# Patient Record
Sex: Male | Born: 1945 | Race: White | Hispanic: No | Marital: Married | Smoking: Never smoker
Health system: Southern US, Community
[De-identification: ages and names within clinical notes are randomized; demographics above are authoritative.]

## PROBLEM LIST (undated history)

## (undated) ENCOUNTER — Ambulatory Visit: Admission: EM | Payer: Medicare HMO | Source: Home / Self Care

## (undated) DIAGNOSIS — C801 Malignant (primary) neoplasm, unspecified: Secondary | ICD-10-CM

## (undated) DIAGNOSIS — R55 Syncope and collapse: Secondary | ICD-10-CM

## (undated) DIAGNOSIS — M316 Other giant cell arteritis: Secondary | ICD-10-CM

## (undated) DIAGNOSIS — I499 Cardiac arrhythmia, unspecified: Secondary | ICD-10-CM

## (undated) HISTORY — PX: APPENDECTOMY: SHX54

## (undated) HISTORY — PX: KNEE SURGERY: SHX244

## (undated) HISTORY — PX: REPLACEMENT TOTAL KNEE: SUR1224

## (undated) HISTORY — PX: NOSE SURGERY: SHX723

## (undated) HISTORY — PX: PACEMAKER INSERTION: SHX728

---

## 2009-05-29 ENCOUNTER — Inpatient Hospital Stay (HOSPITAL_COMMUNITY): Admission: RE | Admit: 2009-05-29 | Discharge: 2009-06-01 | Payer: Self-pay | Admitting: Orthopedic Surgery

## 2009-06-13 ENCOUNTER — Encounter: Payer: Self-pay | Admitting: Orthopedic Surgery

## 2009-06-21 ENCOUNTER — Encounter: Payer: Self-pay | Admitting: Orthopedic Surgery

## 2009-07-22 ENCOUNTER — Encounter: Payer: Self-pay | Admitting: Orthopedic Surgery

## 2009-08-21 ENCOUNTER — Encounter: Payer: Self-pay | Admitting: Orthopedic Surgery

## 2009-09-21 ENCOUNTER — Encounter: Payer: Self-pay | Admitting: Orthopedic Surgery

## 2010-10-09 LAB — HM COLONOSCOPY

## 2010-12-26 LAB — CBC
HCT: 31.7 % — ABNORMAL LOW (ref 39.0–52.0)
HCT: 37.2 % — ABNORMAL LOW (ref 39.0–52.0)
Hemoglobin: 10.8 g/dL — ABNORMAL LOW (ref 13.0–17.0)
Hemoglobin: 11.1 g/dL — ABNORMAL LOW (ref 13.0–17.0)
Hemoglobin: 12.5 g/dL — ABNORMAL LOW (ref 13.0–17.0)
MCHC: 33.9 g/dL (ref 30.0–36.0)
MCHC: 33.9 g/dL (ref 30.0–36.0)
MCV: 85.6 fL (ref 78.0–100.0)
MCV: 85.7 fL (ref 78.0–100.0)
MCV: 85.9 fL (ref 78.0–100.0)
RBC: 3.8 MIL/uL — ABNORMAL LOW (ref 4.22–5.81)
RDW: 13.6 % (ref 11.5–15.5)
RDW: 13.7 % (ref 11.5–15.5)
WBC: 13.2 10*3/uL — ABNORMAL HIGH (ref 4.0–10.5)

## 2010-12-26 LAB — BASIC METABOLIC PANEL
CO2: 30 mEq/L (ref 19–32)
Chloride: 102 mEq/L (ref 96–112)
Chloride: 97 mEq/L (ref 96–112)
GFR calc Af Amer: >60 mL/min (ref >60)
GFR calc non Af Amer: >60 mL/min (ref >60)
Glucose, Bld: 102 mg/dL — ABNORMAL HIGH (ref 70–99)
Glucose, Bld: 149 mg/dL — ABNORMAL HIGH (ref 70–99)
Potassium: 5 mEq/L (ref 3.5–5.1)
Sodium: 133 mEq/L — ABNORMAL LOW (ref 135–145)
Sodium: 137 mEq/L (ref 135–145)

## 2010-12-26 LAB — TYPE AND SCREEN

## 2010-12-27 LAB — CBC
HCT: 46.9 % (ref 39.0–52.0)
Hemoglobin: 15.8 g/dL (ref 13.0–17.0)
MCHC: 33.7 g/dL (ref 30.0–36.0)
RDW: 13.9 % (ref 11.5–15.5)

## 2010-12-27 LAB — URINALYSIS, ROUTINE W REFLEX MICROSCOPIC
Bilirubin Urine: NEGATIVE
Glucose, UA: NEGATIVE mg/dL
Hgb urine dipstick: NEGATIVE
Ketones, ur: NEGATIVE mg/dL
pH: 6 (ref 5.0–8.0)

## 2010-12-27 LAB — COMPREHENSIVE METABOLIC PANEL
Alkaline Phosphatase: 72 U/L (ref 39–117)
BUN: 14 mg/dL (ref 6–23)
Glucose, Bld: 92 mg/dL (ref 70–99)
Potassium: 4.4 mEq/L (ref 3.5–5.1)
Total Bilirubin: 0.9 mg/dL (ref 0.3–1.2)
Total Protein: 7.5 g/dL (ref 6.0–8.3)

## 2010-12-27 LAB — PROTIME-INR
INR: 1 (ref 0.00–1.49)
Prothrombin Time: 12.7 seconds (ref 11.6–15.2)

## 2011-02-06 NOTE — H&P (Signed)
NAMEESVIN, HNAT NO.:  [id number]   MEDICAL RECORD NO.:  [id number]          PATIENT TYPE:  INP   LOCATION:  0001                         FACILITY:  [facility]   PHYSICIAN:  Ollen Gross, M.D.    DATE OF BIRTH:  [birth year]   DATE OF ADMISSION:  05/29/2009  DATE OF DISCHARGE:                              HISTORY & PHYSICAL   CHIEF COMPLAINT:  Left knee pain.   HISTORY OF PRESENT ILLNESS:  Patient is a 65 year old male who has been  seen by Dr. Lequita Halt for ongoing left knee pain.  He has undergone  injuries in the past playing soccer and underwent ligament  reconstruction.  He has had ongoing problems with his left knee for  quite some time now which brought him in to see Dr. Lequita Halt.  He has  been found to have bone-on-bone arthritis, medial patellofemoral  compartments, with large osteophytes.  It is felt he has already reached  a point where he could benefit from undergoing surgical intervention.  Risks and benefits been discussed and he elected to proceed with  surgery.   ALLERGIES:  NONE.   CURRENT MEDICATIONS:  None.   PAST MEDICAL HISTORY:  Negative.   PAST SURGICAL HISTORY:  1. Left knee reconstructive surgery in 1975.  2. Nose surgery in 1983.  3. Emergency appendectomy in 1992.  4. Nasal surgery again in 1995.   FAMILY HISTORY:  Father, history of pacemaker.  Mother living, age 3.  He has a brother, 90, a sister, 69.   SOCIAL HISTORY:  Married.  Currently unemployed.  Two to 3 beers a week.  He does have a caregiver lined up after surgery.   REVIEW OF SYSTEMS:  GENERAL:  No fevers, chills, night sweats.  NEURO:  No seizure, syncope, or paralysis.  RESPIRATORY:  No shortness of  breath, productive cough, or hemoptysis.  CARDIOVASCULAR:  No chest  pain, angina, or orthopnea.  GI:  No nausea, vomiting, diarrhea, or  constipation.  GU:  No dysuria, hematuria, or discharge.  MUSCULOSKELETAL:  Left knee.   PHYSICAL EXAM:  VITAL SIGNS:  Pulse  64.  Respirations 12.  Blood  pressure 158/90.  GENERAL:  A 65 year old white male, well nourished, well developed, no  acute distress.  She is alert, oriented, and cooperative.  HEENT:  Normocephalic, atraumatic.  Pupils round and reactive.  EOMs  intact.  NECK:  Supple.  CHEST:  Clear.  HEART:  Regular rate and rhythm without murmur.  S1 and S2 noted.  ABDOMEN:  Soft, nontender.  Bowel sounds present.  RECTAL:  Not done, no pertinent to present illness.  BREASTS:  Not done, no pertinent to present illness.  GENITALIA:  Not done, no pertinent to present illness.  EXTREMITIES:  Left knee, no effusion, well-healed medial arthrotomy scar  noted.  Range of motion 5 to 125.  Slight varus malalignment deformity.   IMPRESSION:  Osteoarthritis, left knee.   PLAN:  Patient admitted to [facility] to undergo a left total  knee replacement arthroplasty.  Surgery will be performed by Dr. Ollen Gross.      Alexzandrew L.  Perkins, P.A.C.      Ollen Gross, M.D.  Electronically Signed    ALP/MEDQ  D:  05/28/2009  T:  05/29/2009  Job:  [id number]   cc:   Julieanne Manson  Fax: [phone]   Ollen Gross, M.D.  Fax: [phone]

## 2011-04-23 ENCOUNTER — Encounter: Payer: Self-pay | Admitting: Sports Medicine

## 2011-05-23 ENCOUNTER — Encounter: Payer: Self-pay | Admitting: Sports Medicine

## 2011-07-16 ENCOUNTER — Ambulatory Visit: Payer: Self-pay | Admitting: Sports Medicine

## 2012-03-24 ENCOUNTER — Emergency Department: Payer: Self-pay | Admitting: Emergency Medicine

## 2012-03-24 LAB — COMPREHENSIVE METABOLIC PANEL
Albumin: 4.1 g/dL (ref 3.4–5.0)
Alkaline Phosphatase: 104 U/L (ref 50–136)
Calcium, Total: 9 mg/dL (ref 8.5–10.1)
Glucose: 98 mg/dL (ref 65–99)
SGOT(AST): 43 U/L — ABNORMAL HIGH (ref 15–37)
SGPT (ALT): 45 U/L
Sodium: 141 mmol/L (ref 136–145)

## 2012-03-24 LAB — CBC
HCT: 48.8 % (ref 40.0–52.0)
HGB: 16.3 g/dL (ref 13.0–18.0)
MCHC: 33.3 g/dL (ref 32.0–36.0)

## 2012-03-24 LAB — URINALYSIS, COMPLETE
Bacteria: NONE SEEN
Bilirubin,UR: NEGATIVE
Ketone: NEGATIVE
Ph: 7 (ref 4.5–8.0)
RBC,UR: 750 /HPF (ref 0–5)
WBC UR: 2 /HPF (ref 0–5)

## 2012-04-01 ENCOUNTER — Ambulatory Visit: Payer: Self-pay | Admitting: Urology

## 2012-04-05 ENCOUNTER — Ambulatory Visit: Payer: Self-pay | Admitting: Urology

## 2012-04-14 ENCOUNTER — Ambulatory Visit: Payer: Self-pay | Admitting: Urology

## 2012-05-05 ENCOUNTER — Ambulatory Visit: Payer: Self-pay | Admitting: Urology

## 2012-09-25 IMAGING — CR DG ABDOMEN 1V
1 series · 2 of 2 positions shown · non-contrast
Comparison: none

REASON FOR EXAM: nephrolithiasis and renal colic
COMMENTS:

[Series 5: t abdomen supine · 0.14mm/px · 2 of 2 slices shown]
[im 1/2]
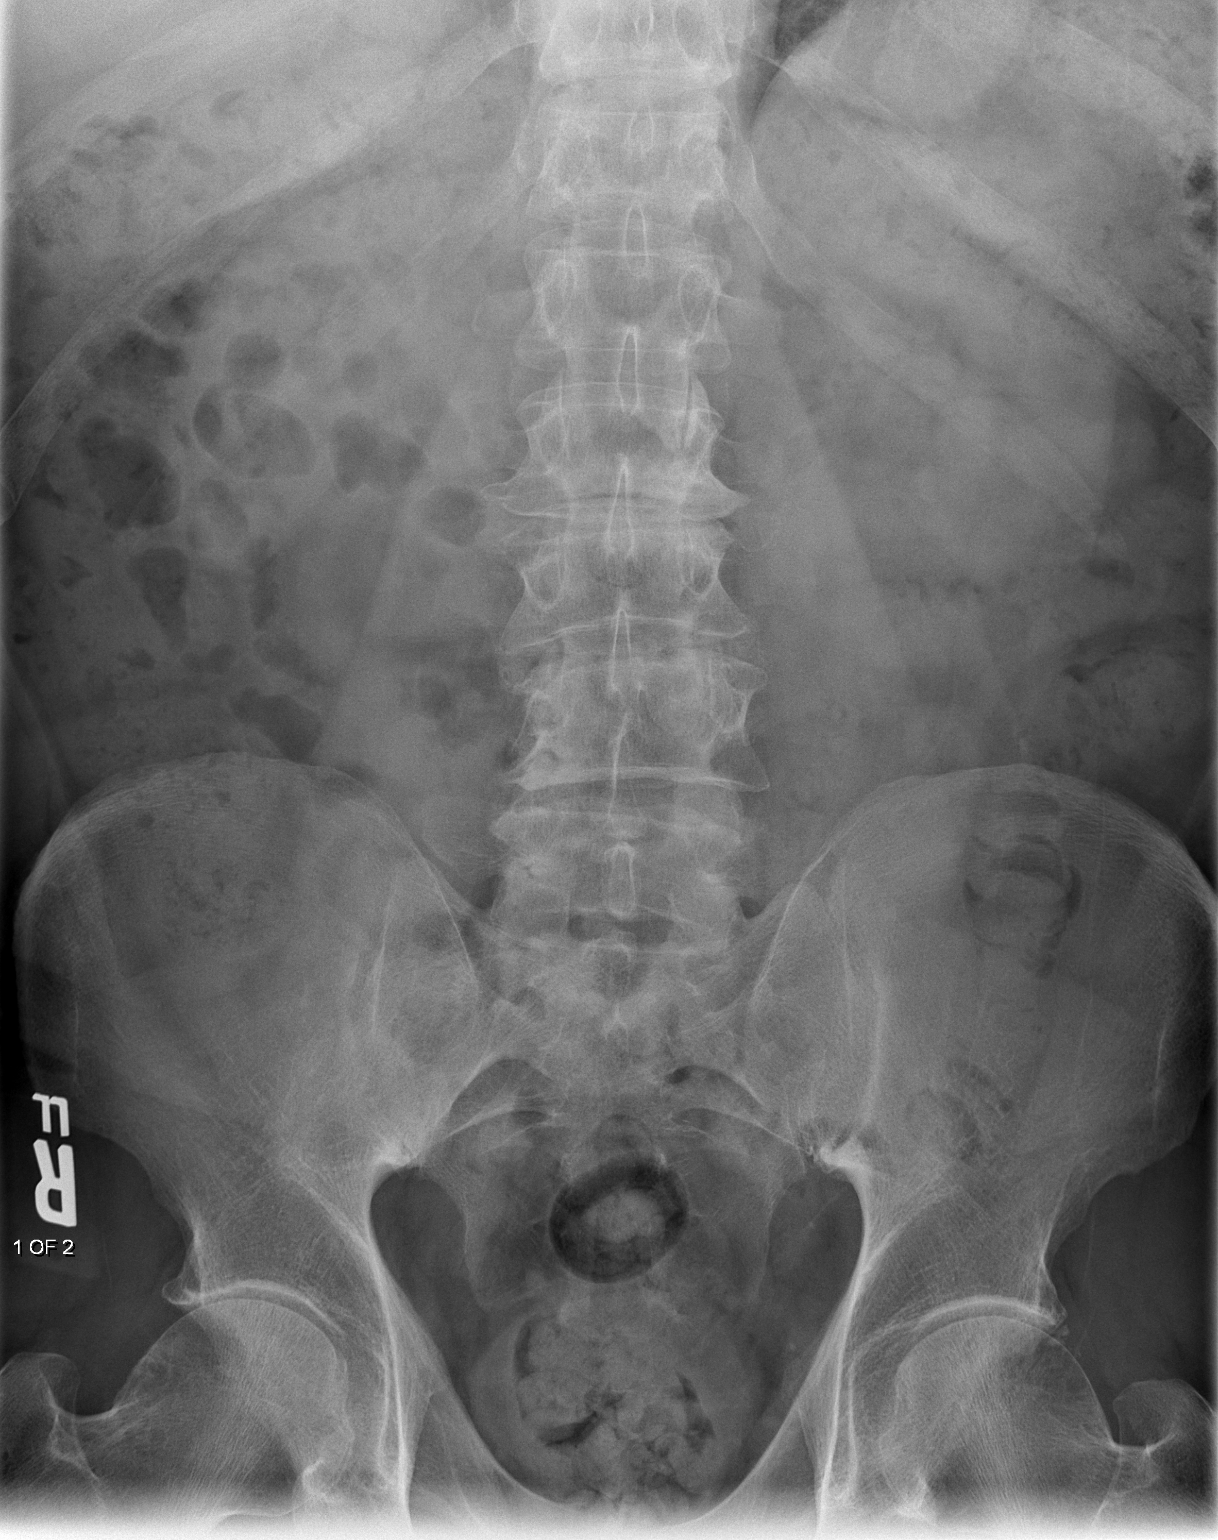
[im 2/2]
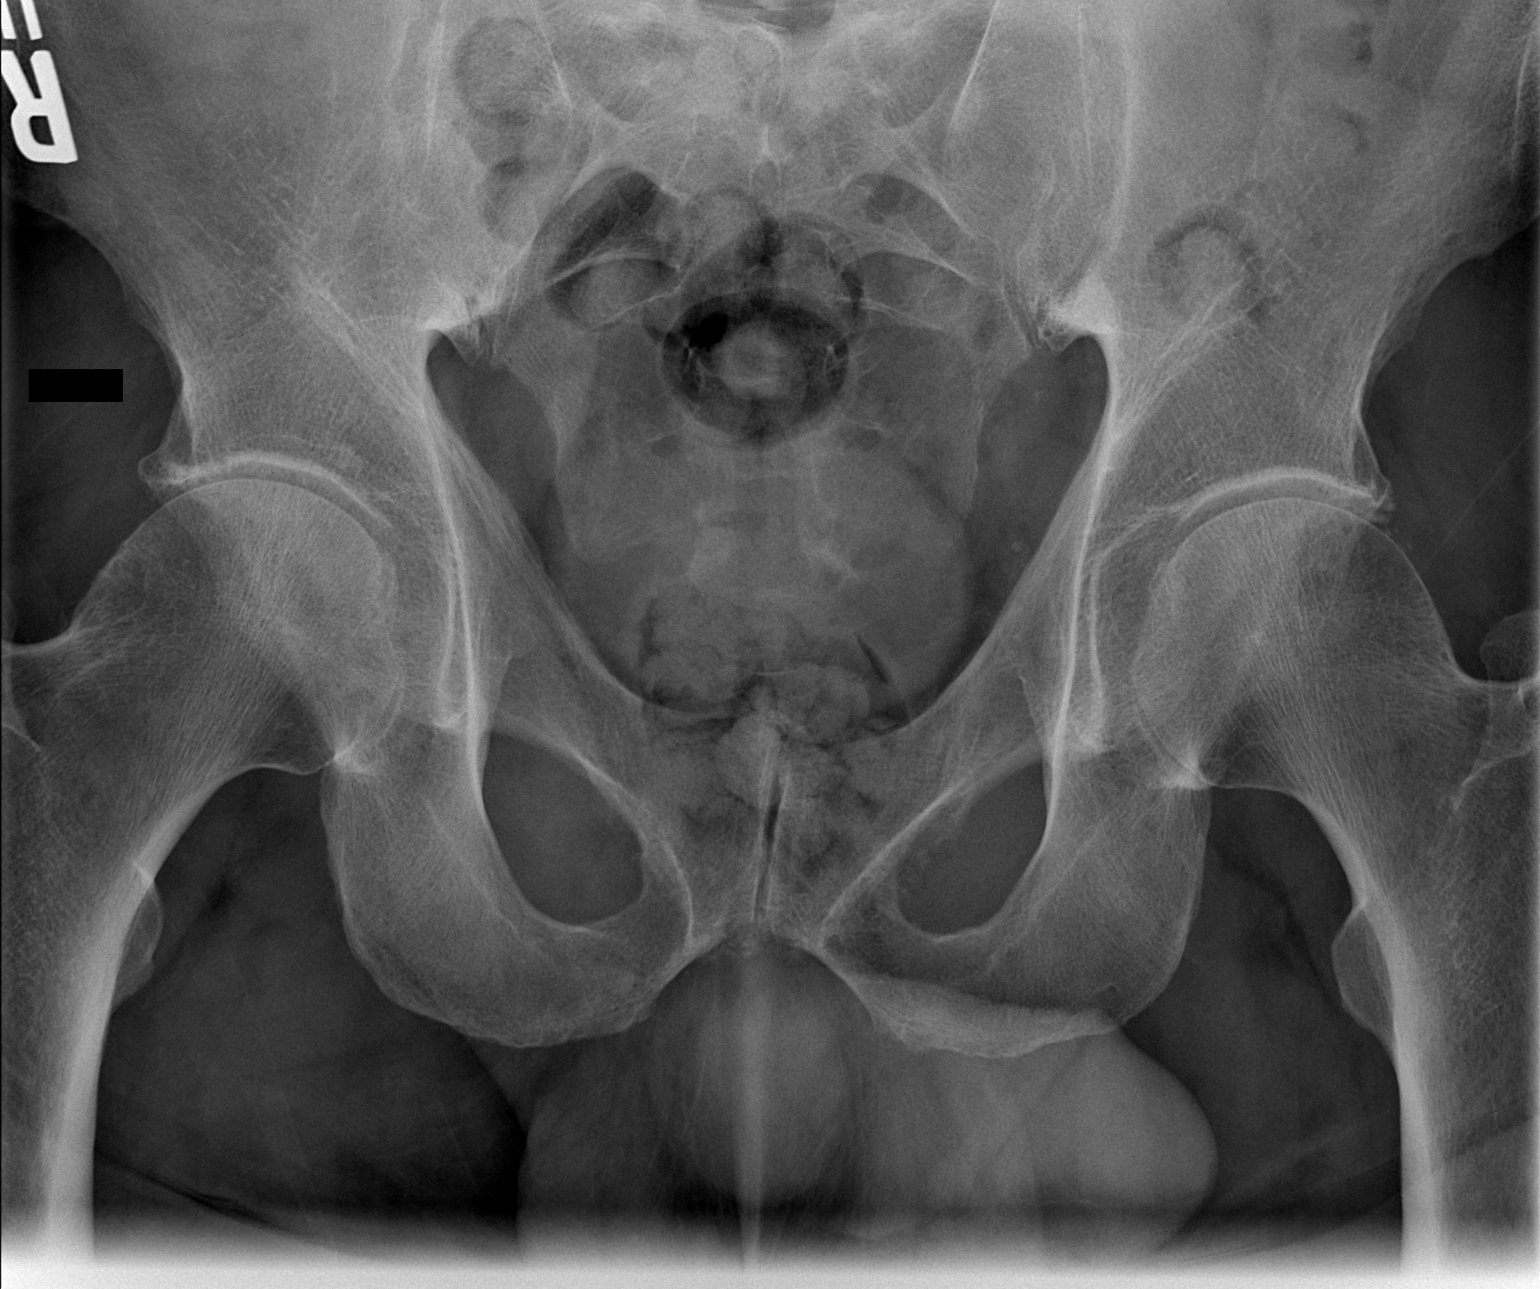

[2 of 2 positions shown; findings below may reference images not displayed]

PROCEDURE:     DXR - DXR KIDNEY URETER BLADDER  - May 05, 2012  [DATE]

RESULT:     Comparison is made to the study 14 April, 2012. The patient has
history of stones in the right urinary tract. On today's study the bowel gas
pattern is within the limits of normal. No abnormal calcifications are
demonstrated over either kidney. Along the expected course of the ureters no
calcifications are seen. No stones are identified in the pelvis in the
region of the urinary bladder.
IMPRESSION: I do not see evidence of calcified urinary tract stones.

[REDACTED]

## 2013-09-27 ENCOUNTER — Ambulatory Visit: Payer: Self-pay | Admitting: Unknown Physician Specialty

## 2014-01-03 ENCOUNTER — Emergency Department: Payer: Self-pay | Admitting: Emergency Medicine

## 2014-01-03 LAB — CBC
HCT: 46.6 % (ref 40.0–52.0)
HGB: 14.9 g/dL (ref 13.0–18.0)
MCH: 27.2 pg (ref 26.0–34.0)
MCHC: 32 g/dL (ref 32.0–36.0)
MCV: 85 fL (ref 80–100)
Platelet: 178 10*3/uL (ref 150–440)
RBC: 5.49 10*6/uL (ref 4.40–5.90)
RDW: 14.3 % (ref 11.5–14.5)
WBC: 8.3 10*3/uL (ref 3.8–10.6)

## 2014-01-03 LAB — COMPREHENSIVE METABOLIC PANEL
ALBUMIN: 3.9 g/dL (ref 3.4–5.0)
ALK PHOS: 110 U/L
ANION GAP: 3 — AB (ref 7–16)
AST: 36 U/L (ref 15–37)
BILIRUBIN TOTAL: 0.4 mg/dL (ref 0.2–1.0)
BUN: 15 mg/dL (ref 7–18)
CHLORIDE: 104 mmol/L (ref 98–107)
CO2: 30 mmol/L (ref 21–32)
CREATININE: 0.9 mg/dL (ref 0.60–1.30)
Calcium, Total: 8.7 mg/dL (ref 8.5–10.1)
EGFR (African American): >60
EGFR (Non-African Amer.): >60
Glucose: 95 mg/dL (ref 65–99)
Osmolality: 274 (ref 275–301)
Potassium: 3.9 mmol/L (ref 3.5–5.1)
SGPT (ALT): 40 U/L (ref 12–78)
Sodium: 137 mmol/L (ref 136–145)
Total Protein: 7.2 g/dL (ref 6.4–8.2)

## 2014-01-03 LAB — TROPONIN I

## 2014-01-03 LAB — PRO B NATRIURETIC PEPTIDE: B-Type Natriuretic Peptide: 29 pg/mL (ref 0–125)

## 2014-03-27 ENCOUNTER — Ambulatory Visit: Payer: Self-pay | Admitting: Family Medicine

## 2015-10-23 HISTORY — PX: SHOULDER SURGERY: SHX246

## 2015-11-29 ENCOUNTER — Other Ambulatory Visit
Admission: RE | Admit: 2015-11-29 | Discharge: 2015-11-29 | Disposition: A | Discharge status: Home / Self Care | Payer: Self-pay | Source: Ambulatory Visit | Attending: Surgery | Admitting: Surgery

## 2015-11-29 DIAGNOSIS — M1711 Unilateral primary osteoarthritis, right knee: Secondary | ICD-10-CM | POA: Insufficient documentation

## 2015-11-29 DIAGNOSIS — M25461 Effusion, right knee: Secondary | ICD-10-CM | POA: Insufficient documentation

## 2015-11-29 LAB — SYNOVIAL CELL COUNT + DIFF, W/ CRYSTALS
CRYSTALS FLUID: NONE SEEN
Eosinophils-Synovial: 0 %
LYMPHOCYTES-SYNOVIAL FLD: 2 %
Monocyte-Macrophage-Synovial Fluid: 2 %
NEUTROPHIL, SYNOVIAL: 96 %
OTHER CELLS-SYN: 0
WBC, SYNOVIAL: 10800 /mm3 — AB (ref 0–200)

## 2015-12-03 LAB — CULTURE, BODY FLUID W GRAM STAIN -BOTTLE

## 2015-12-03 LAB — CULTURE, BODY FLUID-BOTTLE: CULTURE: NO GROWTH

## 2017-07-29 ENCOUNTER — Telehealth: Payer: Self-pay

## 2017-07-29 NOTE — Telephone Encounter (Signed)
Patient's wife is calling to request a full panel of labs done. He was previously a Dr. Venia Minks patient. I advised wife that he may need to be seen before any labs are drawn. She is requesting the labs to be done first, then the appt. Ok to order? Please advise. Thanks!

## 2017-07-29 NOTE — Telephone Encounter (Signed)
This patient has not been seen in years it looks like. Nothing in Epic and no information in [employer], RMA

## 2017-07-29 NOTE — Telephone Encounter (Signed)
no

## 2017-07-30 DIAGNOSIS — M1990 Unspecified osteoarthritis, unspecified site: Secondary | ICD-10-CM | POA: Insufficient documentation

## 2017-07-30 DIAGNOSIS — J310 Chronic rhinitis: Secondary | ICD-10-CM | POA: Insufficient documentation

## 2017-07-30 NOTE — Telephone Encounter (Signed)
Wife advised. Looked in harmony again and I did find his results but under Randy-I overlooked it and apologized to the wife. Last office visit was 03/2014. Appointment made, there was no CPE appointments available so put patient in routine slot.-Anastasiya V Hopkins, RMA

## 2017-08-02 ENCOUNTER — Ambulatory Visit: Payer: Self-pay | Admitting: Family Medicine

## 2017-08-16 ENCOUNTER — Ambulatory Visit: Payer: BLUE CROSS/BLUE SHIELD | Admitting: Family Medicine

## 2017-08-16 ENCOUNTER — Encounter: Payer: Self-pay | Admitting: Family Medicine

## 2017-08-16 ENCOUNTER — Other Ambulatory Visit: Payer: Self-pay

## 2017-08-16 VITALS — BP 114/62 | HR 68 | Temp 98.0°F | Resp 16 | Wt 193.4 lb

## 2017-08-16 DIAGNOSIS — Z23 Encounter for immunization: Secondary | ICD-10-CM | POA: Diagnosis not present

## 2017-08-16 DIAGNOSIS — M1990 Unspecified osteoarthritis, unspecified site: Secondary | ICD-10-CM

## 2017-08-16 DIAGNOSIS — Z2821 Immunization not carried out because of patient refusal: Secondary | ICD-10-CM | POA: Diagnosis not present

## 2017-08-16 DIAGNOSIS — Z1159 Encounter for screening for other viral diseases: Secondary | ICD-10-CM

## 2017-08-16 DIAGNOSIS — R5383 Other fatigue: Secondary | ICD-10-CM | POA: Diagnosis not present

## 2017-08-16 DIAGNOSIS — M779 Enthesopathy, unspecified: Secondary | ICD-10-CM

## 2017-08-16 DIAGNOSIS — E7849 Other hyperlipidemia: Secondary | ICD-10-CM | POA: Diagnosis not present

## 2017-08-16 NOTE — Progress Notes (Signed)
Glenn Perez  MRN: [id number] DOB: May 20, 1946  Subjective:  HPI  Patient states he is just here for check up. Last office visit was in 2015. Patient has not been seen any other provider except eye doctor. He has some occasional tendonitis as he plays tennis. He has fatigue compared to younger years.he plans to retire from [employer] March 2019. No issues or concerns today. Patient Active Problem List   Diagnosis Date Noted  . Arthritis 07/30/2017  . Chronic rhinitis 07/30/2017    History reviewed. No pertinent past medical history.  Social History   Socioeconomic History  . Marital status: Married    Spouse name: Not on file  . Number of children: Not on file  . Years of education: Not on file  . Highest education level: Not on file  Social Needs  . Financial resource strain: Not on file  . Food insecurity - worry: Not on file  . Food insecurity - inability: Not on file  . Transportation needs - medical: Not on file  . Transportation needs - non-medical: Not on file  Occupational History  . Not on file  Tobacco Use  . Smoking status: Never Smoker  . Smokeless tobacco: Never Used  Substance and Sexual Activity  . Alcohol use: Yes    Comment: occasionally  . Drug use: No  . Sexual activity: Not on file  Other Topics Concern  . Not on file  Social History Narrative  . Not on file    Outpatient Encounter Medications as of 08/16/2017  Medication Sig  . fluticasone (FLONASE) 50 MCG/ACT nasal spray Place into the nose.  . [DISCONTINUED] MULTIPLE VITAMIN PO Take by mouth.  . [DISCONTINUED] Omega 3 1000 MG CAPS Take by mouth.   No facility-administered encounter medications on file as of 08/16/2017.     No Known Allergies  Review of Systems  Constitutional: Negative.   Respiratory: Negative.   Cardiovascular: Negative.   Musculoskeletal: Negative.   Psychiatric/Behavioral: Negative.     Objective:  BP 114/62   Pulse 68   Temp 98 F (36.7 C)   Resp  16   Wt 193 lb 6.4 oz (87.7 kg)   Physical Exam  Constitutional: He is oriented to person, place, and time and well-developed, well-nourished, and in no distress.  HENT:  Head: Normocephalic and atraumatic.  Right Ear: External ear normal.  Left Ear: External ear normal.  Nose: Nose normal.  Eyes: Conjunctivae are normal.  Neck: No thyromegaly present.  Cardiovascular: Normal rate, regular rhythm and normal heart sounds.  Pulmonary/Chest: Breath sounds normal.  Abdominal: Soft.  Neurological: He is alert and oriented to person, place, and time. Gait normal. GCS score is 15.  Skin: Skin is warm and dry.  Psychiatric: Memory, affect and judgment normal.    Assessment and Plan :  1. Other fatigue - CBC with Differential/Platelet - Comprehensive metabolic panel - TSH  2. Influenza vaccination declined  3. Need for Tdap vaccination Administered today. 4. Pneumococcal vaccination declined by patient  5. Other hyperlipidemia - Comprehensive metabolic panel - Lipid Profile  6. Osteoarthritis, unspecified osteoarthritis type, unspecified site - TSH  7. Tendonitis  8. Need for hepatitis C screening test - Hepatitis C Antibody  HPI, Exam and A&P transcribed by Tiffany Kocher, RMA under direction and in the presence of Miguel Aschoff, MD. I have done the exam and reviewed the chart and it is accurate to the best of my knowledge. Development worker, community has been used and  any errors in dictation or transcription are unintentional. Miguel Aschoff M.D. [facility]

## 2017-09-27 ENCOUNTER — Other Ambulatory Visit: Payer: Self-pay | Admitting: Family Medicine

## 2017-09-28 ENCOUNTER — Telehealth: Payer: Self-pay

## 2017-09-28 LAB — COMPREHENSIVE METABOLIC PANEL
ALK PHOS: 109 IU/L (ref 39–117)
ALT: 26 IU/L (ref 0–44)
AST: 33 IU/L (ref 0–40)
Albumin/Globulin Ratio: 1.9 (ref 1.2–2.2)
Albumin: 4.2 g/dL (ref 3.6–4.8)
BILIRUBIN TOTAL: 0.6 mg/dL (ref 0.0–1.2)
BUN / CREAT RATIO: 14 (ref 10–24)
BUN: 14 mg/dL (ref 8–27)
CO2: 22 mmol/L (ref 20–29)
CREATININE: 1.01 mg/dL (ref 0.76–1.27)
Calcium: 9.4 mg/dL (ref 8.6–10.2)
Chloride: 102 mmol/L (ref 96–106)
GFR calc Af Amer: 87 mL/min/{1.73_m2} (ref >59)
GFR calc non Af Amer: 76 mL/min/{1.73_m2} (ref >59)
GLUCOSE: 93 mg/dL (ref 65–99)
Globulin, Total: 2.2 g/dL (ref 1.5–4.5)
Potassium: 4.4 mmol/L (ref 3.5–5.2)
Sodium: 141 mmol/L (ref 134–144)
Total Protein: 6.4 g/dL (ref 6.0–8.5)

## 2017-09-28 LAB — CBC WITH DIFFERENTIAL/PLATELET
BASOS ABS: 0 10*3/uL (ref 0.0–0.2)
Basos: 1 %
EOS (ABSOLUTE): 0.1 10*3/uL (ref 0.0–0.4)
Eos: 2 %
HEMOGLOBIN: 14.4 g/dL (ref 13.0–17.7)
Hematocrit: 42.2 % (ref 37.5–51.0)
Immature Grans (Abs): 0 10*3/uL (ref 0.0–0.1)
Immature Granulocytes: 0 %
LYMPHS ABS: 1.7 10*3/uL (ref 0.7–3.1)
Lymphs: 26 %
MCH: 27.7 pg (ref 26.6–33.0)
MCHC: 34.1 g/dL (ref 31.5–35.7)
MCV: 81 fL (ref 79–97)
MONOCYTES: 14 %
MONOS ABS: 1 10*3/uL — AB (ref 0.1–0.9)
NEUTROS ABS: 3.8 10*3/uL (ref 1.4–7.0)
Neutrophils: 57 %
PLATELETS: 187 10*3/uL (ref 150–379)
RBC: 5.2 x10E6/uL (ref 4.14–5.80)
RDW: 14.5 % (ref 12.3–15.4)
WBC: 6.6 10*3/uL (ref 3.4–10.8)

## 2017-09-28 LAB — LIPID PANEL WITH LDL/HDL RATIO
CHOLESTEROL TOTAL: 209 mg/dL — AB (ref 100–199)
HDL: 60 mg/dL (ref >39)
LDL CALC: 125 mg/dL — AB (ref 0–99)
LDl/HDL Ratio: 2.1 ratio (ref 0.0–3.6)
TRIGLYCERIDES: 119 mg/dL (ref 0–149)
VLDL CHOLESTEROL CAL: 24 mg/dL (ref 5–40)

## 2017-09-28 LAB — TSH: TSH: 4.52 u[IU]/mL — ABNORMAL HIGH (ref 0.450–4.500)

## 2017-09-28 LAB — HEPATITIS C ANTIBODY

## 2017-09-28 NOTE — Telephone Encounter (Signed)
Patient advised as below. Patient requested a copy of labs. Printed and mailed. sd

## 2017-09-28 NOTE — Telephone Encounter (Signed)
-----   Message from Jerrol Banana., MD sent at 09/28/2017  9:43 AM EST ----- Labs ok--would repeat TSH in next year.

## 2017-10-07 ENCOUNTER — Encounter: Payer: Self-pay | Admitting: Family Medicine

## 2017-10-07 LAB — HM COLONOSCOPY

## 2017-10-15 ENCOUNTER — Encounter: Payer: Self-pay | Admitting: Unknown Physician Specialty

## 2017-12-14 ENCOUNTER — Ambulatory Visit: Payer: BLUE CROSS/BLUE SHIELD | Attending: Orthopedic Surgery

## 2017-12-14 DIAGNOSIS — M62838 Other muscle spasm: Secondary | ICD-10-CM | POA: Insufficient documentation

## 2017-12-14 DIAGNOSIS — M25521 Pain in right elbow: Secondary | ICD-10-CM | POA: Diagnosis present

## 2017-12-14 NOTE — Therapy (Signed)
[facility] [address], [place], [address] Phone: [phone]   Fax:  [phone]  Physical Therapy Evaluation  Patient Details  Name: Glenn Perez MRN: [id number] Date of Birth: [birth year] Referring Provider: Posey Pronto   Encounter Date: 12/14/2017  PT End of Session - 12/14/17 1656    Visit Number  1    Number of Visits  12    Date for PT Re-Evaluation  01/25/18    PT Start Time  4081    PT Stop Time  1615    PT Time Calculation (min)  60 min    Activity Tolerance  Patient tolerated treatment well    Behavior During Therapy  [facility] for tasks assessed/performed       History reviewed. No pertinent past medical history.  Past Surgical History:  Procedure Laterality Date  . APPENDECTOMY    . KNEE SURGERY    . NOSE SURGERY     x 2.  . REPLACEMENT TOTAL KNEE    . SHOULDER SURGERY  10/2015   for partially torn rotator cuff and detached bicep tendon.    There were no vitals filed for this visit.   Subjective Assessment - 12/14/17 1650    Subjective  Patient demonstrates increased R medial elbow and medial wrist pain along the flexor tendon bundle. Patient reports he has increased pain with playing tennis and performing pulling motions such as performing rows in the gym. Patient also demonstrates difficulty with performing functional activities such as opening jars and performing general wrist flexion or extension. Patient also  reports he plays tennis around 4 x week and would like to play more tennis without pain. Patient states he is soon to receive a shot for a steriod injection into the medial flexor bundle but is unsure secondary to not enjoying receiving medications.     Currently in Pain?  Yes    Pain Score  2  worst: 6/10; best: 2/10    Pain Location  Elbow    Pain Orientation  Right    Pain Descriptors / Indicators  Aching    Pain Type  Chronic pain    Pain Onset  More than a month ago    Pain Frequency  Intermittent         [facility] PT Assessment - 12/14/17 1829      Assessment   Medical Diagnosis  R elbow pain    Referring Provider  Patel    Onset Date/Surgical Date  10/22/17    Hand Dominance  Right    Next MD Visit  unknown    Prior Therapy  yes      Balance Screen   Has the patient fallen in the past 6 months  No    Has the patient had a decrease in activity level because of a fear of falling?   No    Is the patient reluctant to leave their home because of a fear of falling?   No      Prior Function   Level of Independence  Independent    Vocation  Retired    Biomedical scientist  N/A    Leisure  Tennis      Cognition   Overall Cognitive Status  Within Functional Limits for tasks assessed      Observation/Other Assessments   Observations  Increased pain and spasms with resisted wrist motions      Sensation   Light Touch  Appears Intact  Functional Tests   Functional tests  Other      Other:   Other/ Comments  Tennis Serve: Increased pain with overhead motion      Posture/Postural Control   Posture Comments  posture WNL      ROM / Strength   AROM / PROM / Strength  AROM;Strength      AROM   AROM Assessment Site  Shoulder;Forearm;Wrist    Right/Left Shoulder  Right Left: WNL    Right Shoulder Flexion  160 Degrees    Right Shoulder ABduction  160 Degrees    Right/Left Forearm  Right    Right Forearm Pronation  90 Degrees    Right Forearm Supination  80 Degrees    Right/Left Wrist  Right Left: WNL    Right Wrist Extension  60 Degrees    Right Wrist Flexion  80 Degrees    Right Wrist Radial Deviation  20 Degrees    Right Wrist Ulnar Deviation  40 Degrees      Strength   Strength Assessment Site  Wrist;Forearm;Elbow    Right/Left Elbow  Right    Right Elbow Flexion  4+/5 pain    Right Elbow Extension  5/5    Right/Left Forearm  Right    Right Forearm Pronation  5/5    Right Forearm Supination  5/5 increased pain    Right/Left Wrist   Right    Right Wrist Flexion  4+/5 pain    Right Wrist Extension  5/5    Right Wrist Radial Deviation  5/5    Right Wrist Ulnar Deviation  4/5 pain       Palpation   Palpation comment  Increased pain and spasms TTP along wrist flexor bundle most notably along flexor carpi ulnaris and ulnar side of the wrist        Objective measurements completed on examination: See above findings.   TREATMENT Therapeutic Exercise: Wrist flexion with 5# weight -- x 20  Wrist flexion eccentrics -- educated to perform at home  Dry Needling(62min):  (1) .25x43mm dry needle placed along the flexor bundle on the right arm to decrease increased pain and spasms. Patient demonstrates slight improvement of symptoms afterwards. Patient educated on risks of therapy and verbally consents to treatment    Patient demonstrates decreased pain at end of the session      PT Education - 12/14/17 1655    Education provided  Yes    Education Details  Elbow eccentrics, wrist flexion with 5# weight    Person(s) Educated  Patient    Methods  Explanation;Demonstration    Comprehension  Verbalized understanding;Returned demonstration          PT Long Term Goals - 12/14/17 1729      PT LONG TERM GOAL #1   Title  Patient will be independent with HEP focused on improving elbow strengthening and coordination.     Baseline  Patient requiries moderate cueing for exercise progression    Time  6    Period  Weeks    Status  New    Target Date  01/25/18      PT LONG TERM GOAL #2   Title  Patient will be able to play a full match of tennis without increase in pain to indicate improvement in elbow function    Baseline  increased pain to 6/10 with tennis playing    Time  6    Period  Weeks    Status  New    Target  Date  01/25/18      PT LONG TERM GOAL #3   Title  Patient will have a worst pain of 2/10 to indicatine significant improvement in pain and spasms within the elbow.    Baseline  6/10 worst pain     Time  6    Period  Weeks    Status  New    Target Date  01/25/18             Plan - 12/14/17 1657    Clinical Impression Statement  Patient is a right hand dominant 72 yo male presenting with increased R elbow pain. Patient demonstrates increased R elbow dysfunction most notabley with performing of wrist flexion and ulnar deviation most likely with wrist flexor bundle irratation and increased flexor carpi ulnaris involvement. Patient also demonstrates increased pain with performance of wrist movements resulting in wrist muscular pain. Patient will benefit from further skilled therapy to return to prior level of function.     History and Personal Factors relevant to plan of care:  previous shoulder pain     Clinical Presentation  Stable    Clinical Decision Making  Low    Rehab Potential  Good    Clinical Impairments Affecting Rehab Potential  (+) highly active, highly motivated (-) age    PT Frequency  2x / week    PT Duration  6 weeks    PT Treatment/Interventions  Iontophoresis 4mg /ml Dexamethasone;Moist Heat;Electrical Stimulation;Cryotherapy;Ultrasound;Neuromuscular re-education;Patient/family education;Passive range of motion;Dry needling    PT Next Visit Plan  Progress strengthening, and dry needling to decrease pain    PT Home Exercise Plan  See education section    Consulted and Agree with Plan of Care  Patient       Patient will benefit from skilled therapeutic intervention in order to improve the following deficits and impairments:  Pain, Increased muscle spasms, Increased fascial restricitons  Visit Diagnosis: Pain in right elbow  Other muscle spasm     Problem List Patient Active Problem List   Diagnosis Date Noted  . Arthritis 07/30/2017  . Chronic rhinitis 07/30/2017    Blythe Stanford, PT DPT 12/14/2017, 7:32 PM  [facility] [address], [place], [address] Phone: [phone]    Fax:  [phone]  Name: Glenn Perez MRN: [id number] Date of Birth: March 10, 1946

## 2017-12-20 ENCOUNTER — Ambulatory Visit: Payer: Medicare HMO | Attending: Orthopedic Surgery

## 2017-12-20 DIAGNOSIS — M62838 Other muscle spasm: Secondary | ICD-10-CM

## 2017-12-20 DIAGNOSIS — M25521 Pain in right elbow: Secondary | ICD-10-CM | POA: Diagnosis present

## 2017-12-20 NOTE — Therapy (Signed)
[facility] [address], [place], [address] Phone: [phone]   Fax:  [phone]  Physical Therapy Treatment  Patient Details  Name: Glenn Perez MRN: [id number] Date of Birth: 27-Jul-1946 Referring Provider: Posey Pronto   Encounter Date: 12/20/2017  PT End of Session - 12/20/17 1827    Visit Number  2    Number of Visits  12    Date for PT Re-Evaluation  01/25/18    PT Start Time  1630    PT Stop Time  9798    PT Time Calculation (min)  45 min    Activity Tolerance  Patient tolerated treatment well    Behavior During Therapy  [facility] for tasks assessed/performed       History reviewed. No pertinent past medical history.  Past Surgical History:  Procedure Laterality Date  . APPENDECTOMY    . KNEE SURGERY    . NOSE SURGERY     x 2.  . REPLACEMENT TOTAL KNEE    . SHOULDER SURGERY  10/2015   for partially torn rotator cuff and detached bicep tendon.    There were no vitals filed for this visit.  Subjective Assessment - 12/20/17 1824    Subjective  Patient reports he has able to play tennis with minimal pain afterwards but has increased soreness along his elbow, shoulder. wrist.    Patient Stated Goals  Playing tennis    Currently in Pain?  Yes    Pain Score  2     Pain Location  Elbow    Pain Orientation  Right    Pain Descriptors / Indicators  Aching    Pain Type  Chronic pain    Pain Onset  More than a month ago    Pain Frequency  Intermittent      TREATMENT Manual therapy Active release techniques with patient positioned in sitting to decrease increased spasms and pain. Patient states decreased pain after this performance. Techniques performed to flexor carpi ulnaris STM performed to flexor bundle of the affected right elbow with patient in sitting to decrease increased pain and spasms.  Therapetic Exercise Putty squeeze performed in sitting -- 6 min Finger flexion behind back in  standing -- 5# B -- x 50 Eccentrics flexor of the wrists -- 2 x 30 with 10 & 7# weight  Patient demonstrates increased fatigue at end of the session   PT Education - 12/20/17 1827    Education provided  Yes    Education Details  HEP putty squeezes    Person(s) Educated  Patient    Methods  Explanation;Demonstration    Comprehension  Verbalized understanding;Returned demonstration          PT Long Term Goals - 12/14/17 1729      PT LONG TERM GOAL #1   Title  Patient will be independent with HEP focused on improving elbow strengthening and coordination.     Baseline  Patient requiries moderate cueing for exercise progression    Time  6    Period  Weeks    Status  New    Target Date  01/25/18      PT LONG TERM GOAL #2   Title  Patient will be able to play a full match of tennis without increase in pain to indicate improvement in elbow function    Baseline  increased pain to 6/10 with tennis playing    Time  6    Period  Weeks  Status  New    Target Date  01/25/18      PT LONG TERM GOAL #3   Title  Patient will have a worst pain of 2/10 to indicatine significant improvement in pain and spasms within the elbow.    Baseline  6/10 worst pain    Time  6    Period  Weeks    Status  New    Target Date  01/25/18            Plan - 12/20/17 1828    Clinical Impression Statement  Patient demonstrates decreased pain after performing ART to the elbow indicating decreased muscular spasms and pain. Patient continues to have pain with performing wrist based exercises and patient will benefit from further skilled therapy to return to prior level of function.     Rehab Potential  Good    Clinical Impairments Affecting Rehab Potential  (+) highly active, highly motivated (-) age    PT Frequency  2x / week    PT Duration  6 weeks    PT Treatment/Interventions  Iontophoresis 4mg /ml Dexamethasone;Moist Heat;Electrical Stimulation;Cryotherapy;Ultrasound;Neuromuscular  re-education;Patient/family education;Passive range of motion;Dry needling    PT Next Visit Plan  Progress strengthening, and dry needling to decrease pain    PT Home Exercise Plan  See education section    Consulted and Agree with Plan of Care  Patient       Patient will benefit from skilled therapeutic intervention in order to improve the following deficits and impairments:  Pain, Increased muscle spasms, Increased fascial restricitons  Visit Diagnosis: Pain in right elbow  Other muscle spasm     Problem List Patient Active Problem List   Diagnosis Date Noted  . Arthritis 07/30/2017  . Chronic rhinitis 07/30/2017    Blythe Stanford, PT DPT 12/20/2017, 6:30 PM  [facility] [address], [place], [address] Phone: [phone]   Fax:  [phone]  Name: BRADSHAW MINIHAN MRN: [id number] Date of Birth: 02-Aug-1946

## 2017-12-21 ENCOUNTER — Ambulatory Visit: Payer: Medicare HMO

## 2017-12-29 ENCOUNTER — Ambulatory Visit: Payer: Medicare HMO

## 2017-12-29 DIAGNOSIS — Z0100 Encounter for examination of eyes and vision without abnormal findings: Secondary | ICD-10-CM | POA: Diagnosis not present

## 2017-12-30 ENCOUNTER — Ambulatory Visit: Payer: Medicare HMO

## 2017-12-30 DIAGNOSIS — M25521 Pain in right elbow: Secondary | ICD-10-CM

## 2017-12-30 DIAGNOSIS — M62838 Other muscle spasm: Secondary | ICD-10-CM | POA: Diagnosis not present

## 2017-12-30 NOTE — Therapy (Signed)
[place] [facility] [address] [place], [address] Phone: [phone]   Fax:  [phone]  Physical Therapy Treatment  Patient Details  Name: Glenn Perez MRN: [id number] Date of Birth: July 12, 1946 Referring Provider: Posey Pronto   Encounter Date: 12/30/2017  PT End of Session - 12/30/17 1746    Visit Number  3    Number of Visits  12    Date for PT Re-Evaluation  01/25/18    PT Start Time  7619    PT Stop Time  1500    PT Time Calculation (min)  45 min    Activity Tolerance  Patient tolerated treatment well    Behavior During Therapy  [facility] for tasks assessed/performed       History reviewed. No pertinent past medical history.  Past Surgical History:  Procedure Laterality Date  . APPENDECTOMY    . KNEE SURGERY    . NOSE SURGERY     x 2.  . REPLACEMENT TOTAL KNEE    . SHOULDER SURGERY  10/2015   for partially torn rotator cuff and detached bicep tendon.    There were no vitals filed for this visit.  Subjective Assessment - 12/30/17 1544    Subjective  Patient reports he feels like his elbow has not gotten any better since the onset. Patient reports he would like to play tennis without any increase in pain.     Patient Stated Goals  Playing tennis    Currently in Pain?  Yes    Pain Score  4     Pain Location  Elbow    Pain Orientation  Right    Pain Descriptors / Indicators  Aching    Pain Type  Chronic pain    Pain Onset  More than a month ago    Pain Frequency  Intermittent       TREATMENT Therapeutic Exercise: Overhead tricep hammer wrist curls - 2 x 25 with 5# Supination/pronation with the wrist with dumbbell - x15 3# Isometric wrist flexion - x 15 with 10sec holds  Iontophoresis: 68min. One medium patch placed along the wrist flexor tendon bundle on the affected UE iontophoresis placed with 8ml of dexamethasone. At 42mA*min for 17min with patient in sitting with no major skin changes  noted.  Manual therapy Active release techniques with patient positioned in sitting to decrease increased spasms and pain. Patient states decreased pain after this performance. Techniques performed to flexor carpi ulnaris STM performed to flexor bundle of the affected right elbow with patient in sitting to decrease increased pain and spasms.  Elbow Mobilizations: Grade IV mobilizations in supine 5 x 30sec performed to ulnar-radius; distraction with humeral-ulnar; ulnar-humeral   Patient demonstrates fatigue along his elbow flexors    PT Education - 12/30/17 1746    Education provided  Yes    Education Details  form/technique with exercise     Person(s) Educated  Patient    Methods  Explanation;Demonstration    Comprehension  Verbalized understanding;Returned demonstration          PT Long Term Goals - 12/14/17 1729      PT LONG TERM GOAL #1   Title  Patient will be independent with HEP focused on improving elbow strengthening and coordination.     Baseline  Patient requiries moderate cueing for exercise progression    Time  6    Period  Weeks    Status  New    Target Date  01/25/18  PT LONG TERM GOAL #2   Title  Patient will be able to play a full match of tennis without increase in pain to indicate improvement in elbow function    Baseline  increased pain to 6/10 with tennis playing    Time  6    Period  Weeks    Status  New    Target Date  01/25/18      PT LONG TERM GOAL #3   Title  Patient will have a worst pain of 2/10 to indicatine significant improvement in pain and spasms within the elbow.    Baseline  6/10 worst pain    Time  6    Period  Weeks    Status  New    Target Date  01/25/18            Plan - 12/30/17 1817    Clinical Impression Statement  Patient demonstrates no increase in pain after performing manual therapy and iontophoresis goal to decrease inflammation and muscular spasms along his wrist flexors. Patient demonstrates less pain upon  palpation today versus previous sessions indicating functional carryover and patient will benefit from further skilled therapy to return to prior level of function.     Rehab Potential  Good    Clinical Impairments Affecting Rehab Potential  (+) highly active, highly motivated (-) age    PT Frequency  2x / week    PT Duration  6 weeks    PT Treatment/Interventions  Iontophoresis 4mg /ml Dexamethasone;Moist Heat;Electrical Stimulation;Cryotherapy;Ultrasound;Neuromuscular re-education;Patient/family education;Passive range of motion;Dry needling    PT Next Visit Plan  Progress strengthening, and dry needling to decrease pain    PT Home Exercise Plan  See education section    Consulted and Agree with Plan of Care  Patient       Patient will benefit from skilled therapeutic intervention in order to improve the following deficits and impairments:  Pain, Increased muscle spasms, Increased fascial restricitons  Visit Diagnosis: Pain in right elbow  Other muscle spasm     Problem List Patient Active Problem List   Diagnosis Date Noted  . Arthritis 07/30/2017  . Chronic rhinitis 07/30/2017    Blythe Stanford, PT DPT 12/30/2017, 6:24 PM  [place] [facility] [address] [place], [address] Phone: [phone]   Fax:  [phone]  Name: Glenn Perez MRN: [id number] Date of Birth: 1946/01/15

## 2018-01-03 ENCOUNTER — Ambulatory Visit: Payer: Medicare HMO

## 2018-01-03 DIAGNOSIS — M62838 Other muscle spasm: Secondary | ICD-10-CM | POA: Diagnosis not present

## 2018-01-03 DIAGNOSIS — M25521 Pain in right elbow: Secondary | ICD-10-CM

## 2018-01-03 NOTE — Therapy (Signed)
[place] [facility] [address], [place], [address] Phone: [phone]   Fax:  [phone]  Physical Therapy Treatment  Patient Details  Name: Glenn Perez MRN: [id number] Date of Birth: 1946-09-11 Referring Provider: Posey Pronto   Encounter Date: 01/03/2018  PT End of Session - 01/03/18 1522    Visit Number  4    Number of Visits  12    Date for PT Re-Evaluation  01/25/18    PT Start Time  1430    PT Stop Time  1515    PT Time Calculation (min)  45 min    Activity Tolerance  Patient tolerated treatment well    Behavior During Therapy  [facility] for tasks assessed/performed       History reviewed. No pertinent past medical history.  Past Surgical History:  Procedure Laterality Date  . APPENDECTOMY    . KNEE SURGERY    . NOSE SURGERY     x 2.  . REPLACEMENT TOTAL KNEE    . SHOULDER SURGERY  10/2015   for partially torn rotator cuff and detached bicep tendon.    There were no vitals filed for this visit.  Subjective Assessment - 01/03/18 1520    Subjective  Patient reports the pain in his elbow continues with activity. Patient reports he has not been performing exercises for the past 12 days.     Patient Stated Goals  Playing tennis    Currently in Pain?  Yes    Pain Score  4     Pain Location  Elbow    Pain Orientation  Right    Pain Descriptors / Indicators  Aching    Pain Type  Chronic pain    Pain Onset  More than a month ago    Pain Frequency  Intermittent        TREATMENT Therapeutic Exercise: Wrist extension in sitting - x 20 5# Wrist flexion in sitting - x 20 5# Assisted pull up at level 14 - x 25 Supination/pronation with the wrist with dumbbell - x20 5# Isometric wrist flexion - x 5 with 10sec holds     Manual therapy Active release techniques with patient positioned in sitting to decrease increased spasms and pain. Patient states decreased pain after this performance. Techniques  performed to flexor carpi ulnaris STM performed to flexor bundle of the affected right elbow with patient in sitting to decrease increased pain and spasms.     Patient demonstrates fatigue along his elbow flexors     PT Education - 01/03/18 1522    Education provided  Yes    Education Details  pull ups with gravitron     Person(s) Educated  Patient    Methods  Demonstration;Explanation    Comprehension  Verbalized understanding;Returned demonstration          PT Long Term Goals - 12/14/17 1729      PT LONG TERM GOAL #1   Title  Patient will be independent with HEP focused on improving elbow strengthening and coordination.     Baseline  Patient requiries moderate cueing for exercise progression    Time  6    Period  Weeks    Status  New    Target Date  01/25/18      PT LONG TERM GOAL #2   Title  Patient will be able to play a full match of tennis without increase in pain to indicate improvement in elbow function  Baseline  increased pain to 6/10 with tennis playing    Time  6    Period  Weeks    Status  New    Target Date  01/25/18      PT LONG TERM GOAL #3   Title  Patient will have a worst pain of 2/10 to indicatine significant improvement in pain and spasms within the elbow.    Baseline  6/10 worst pain    Time  6    Period  Weeks    Status  New    Target Date  01/25/18            Plan - 01/03/18 1525    Clinical Impression Statement  Patient demonstrates increased pain and spasms along elbow flexors with performing activities. Patient demonstrates less pain with exercise today versis previous session indicating functional improvement and carryover. Focused on educating patient on importance of exercise performance with tendonopathy. Patient will benefit from further skilled therapy to return to prior level of function.     Rehab Potential  Good    Clinical Impairments Affecting Rehab Potential  (+) highly active, highly motivated (-) age    PT Frequency   2x / week    PT Duration  6 weeks    PT Treatment/Interventions  Iontophoresis 4mg /ml Dexamethasone;Moist Heat;Electrical Stimulation;Cryotherapy;Ultrasound;Neuromuscular re-education;Patient/family education;Passive range of motion;Dry needling    PT Next Visit Plan  Progress strengthening, and dry needling to decrease pain    PT Home Exercise Plan  See education section    Consulted and Agree with Plan of Care  Patient       Patient will benefit from skilled therapeutic intervention in order to improve the following deficits and impairments:  Pain, Increased muscle spasms, Increased fascial restricitons  Visit Diagnosis: Pain in right elbow  Other muscle spasm     Problem List Patient Active Problem List   Diagnosis Date Noted  . Arthritis 07/30/2017  . Chronic rhinitis 07/30/2017    Blythe Stanford, PT DPT 01/03/2018, 3:35 PM  [place] [facility] [address] [place], [address] Phone: [phone]   Fax:  [phone]  Name: DORSEL FLINN MRN: [id number] Date of Birth: [birth year]

## 2018-01-05 ENCOUNTER — Ambulatory Visit: Payer: Medicare HMO

## 2018-01-05 DIAGNOSIS — M25521 Pain in right elbow: Secondary | ICD-10-CM | POA: Diagnosis not present

## 2018-01-05 DIAGNOSIS — M62838 Other muscle spasm: Secondary | ICD-10-CM | POA: Diagnosis not present

## 2018-01-05 NOTE — Therapy (Signed)
[place] [facility] [address] [place], [address] Phone: [phone]   Fax:  [phone]  Physical Therapy Treatment  Patient Details  Name: Glenn Perez MRN: [id number] Date of Birth: Apr 08, 1946 Referring Provider: Posey Pronto   Encounter Date: 01/05/2018  PT End of Session - 01/05/18 1007    Visit Number  5    Number of Visits  12    Date for PT Re-Evaluation  01/25/18    PT Start Time  0915    PT Stop Time  1000    PT Time Calculation (min)  45 min    Activity Tolerance  Patient tolerated treatment well    Behavior During Therapy  [facility] for tasks assessed/performed       History reviewed. No pertinent past medical history.  Past Surgical History:  Procedure Laterality Date  . APPENDECTOMY    . KNEE SURGERY    . NOSE SURGERY     x 2.  . REPLACEMENT TOTAL KNEE    . SHOULDER SURGERY  10/2015   for partially torn rotator cuff and detached bicep tendon.    There were no vitals filed for this visit.  Subjective Assessment - 01/05/18 0947    Subjective  Patient reports he was able to play tennis yesterday and reports it is sore today but not as intense as previously thought.    Limitations  Lifting    Patient Stated Goals  Playing tennis    Currently in Pain?  Yes    Pain Score  4     Pain Location  Elbow    Pain Orientation  Right    Pain Descriptors / Indicators  Aching    Pain Type  Chronic pain    Pain Onset  More than a month ago    Pain Frequency  Intermittent        TREATMENT Therapeutic Exercise: Wrist flexion in sitting - x 20 4.4#    Manual therapy Active release techniques with patient positioned in sitting to decrease increased spasms and pain. Patient states decreased pain after this performance. Techniques performed to flexor carpi ulnaris STM performed to flexor bundle of the affected right elbow with patient in sitting to decrease increased pain and spasms.   Modalities High  Volt: two small pads placed along the R wrist flexor bundle with patient positioned in supine to decrease increased pain and spasms.   Iontophoresis: 83min. One medium patch placed along the wrist flexor tendon bundle on the affected UE iontophoresis placed with 63ml of dexamethasone. At 86mA*min for 71min with patient in sitting with no major skin changes noted.    Patient demonstrates fatigue along his elbow flexors   PT Education - 01/05/18 0953    Education provided  Yes    Education Details  continue wrist flexion    Person(s) Educated  Patient    Methods  Explanation;Demonstration    Comprehension  Verbalized understanding;Returned demonstration          PT Long Term Goals - 12/14/17 1729      PT LONG TERM GOAL #1   Title  Patient will be independent with HEP focused on improving elbow strengthening and coordination.     Baseline  Patient requiries moderate cueing for exercise progression    Time  6    Period  Weeks    Status  New    Target Date  01/25/18      PT LONG TERM GOAL #2  Title  Patient will be able to play a full match of tennis without increase in pain to indicate improvement in elbow function    Baseline  increased pain to 6/10 with tennis playing    Time  6    Period  Weeks    Status  New    Target Date  01/25/18      PT LONG TERM GOAL #3   Title  Patient will have a worst pain of 2/10 to indicatine significant improvement in pain and spasms within the elbow.    Baseline  6/10 worst pain    Time  6    Period  Weeks    Status  New    Target Date  01/25/18            Plan - 01/05/18 1008    Clinical Impression Statement  Focused on improving pain and spasms along the wrist flexors today as patient demosntrates increased fatigue/soreness after playing tennis yesterday. Patient demonstrates decreased pain after performing modalities treatment but continues to have increased resting pain. Continued to educate on expected outcomes with tendinopathy  and patient will benefit from further skilled therapy to return to prior level of function.     Rehab Potential  Good    Clinical Impairments Affecting Rehab Potential  (+) highly active, highly motivated (-) age    PT Frequency  2x / week    PT Duration  6 weeks    PT Treatment/Interventions  Iontophoresis 4mg /ml Dexamethasone;Moist Heat;Electrical Stimulation;Cryotherapy;Ultrasound;Neuromuscular re-education;Patient/family education;Passive range of motion;Dry needling    PT Next Visit Plan  Progress strengthening, and dry needling to decrease pain    PT Home Exercise Plan  See education section    Consulted and Agree with Plan of Care  Patient       Patient will benefit from skilled therapeutic intervention in order to improve the following deficits and impairments:  Pain, Increased muscle spasms, Increased fascial restricitons  Visit Diagnosis: Pain in right elbow  Other muscle spasm     Problem List Patient Active Problem List   Diagnosis Date Noted  . Arthritis 07/30/2017  . Chronic rhinitis 07/30/2017    Blythe Stanford, PT DPT 01/05/2018, 10:58 AM  [place] [facility] [address], [place], [address] Phone: [phone]   Fax:  [phone]  Name: Glenn Perez MRN: [id number] Date of Birth: 01-26-1946

## 2018-01-11 ENCOUNTER — Ambulatory Visit: Payer: Medicare HMO

## 2018-01-11 DIAGNOSIS — M7701 Medial epicondylitis, right elbow: Secondary | ICD-10-CM | POA: Diagnosis not present

## 2018-01-11 DIAGNOSIS — M25521 Pain in right elbow: Secondary | ICD-10-CM | POA: Diagnosis not present

## 2018-01-11 DIAGNOSIS — M778 Other enthesopathies, not elsewhere classified: Secondary | ICD-10-CM | POA: Diagnosis not present

## 2018-01-13 ENCOUNTER — Ambulatory Visit: Payer: Medicare HMO

## 2018-02-02 DIAGNOSIS — R69 Illness, unspecified: Secondary | ICD-10-CM | POA: Diagnosis not present

## 2018-03-29 DIAGNOSIS — M25561 Pain in right knee: Secondary | ICD-10-CM | POA: Diagnosis not present

## 2018-03-29 DIAGNOSIS — G8929 Other chronic pain: Secondary | ICD-10-CM | POA: Diagnosis not present

## 2018-05-26 DIAGNOSIS — M25561 Pain in right knee: Secondary | ICD-10-CM | POA: Diagnosis not present

## 2018-05-30 DIAGNOSIS — X32XXXA Exposure to sunlight, initial encounter: Secondary | ICD-10-CM | POA: Diagnosis not present

## 2018-05-30 DIAGNOSIS — L821 Other seborrheic keratosis: Secondary | ICD-10-CM | POA: Diagnosis not present

## 2018-05-30 DIAGNOSIS — D2261 Melanocytic nevi of right upper limb, including shoulder: Secondary | ICD-10-CM | POA: Diagnosis not present

## 2018-05-30 DIAGNOSIS — D2272 Melanocytic nevi of left lower limb, including hip: Secondary | ICD-10-CM | POA: Diagnosis not present

## 2018-05-30 DIAGNOSIS — D2271 Melanocytic nevi of right lower limb, including hip: Secondary | ICD-10-CM | POA: Diagnosis not present

## 2018-05-30 DIAGNOSIS — L570 Actinic keratosis: Secondary | ICD-10-CM | POA: Diagnosis not present

## 2018-05-30 DIAGNOSIS — D225 Melanocytic nevi of trunk: Secondary | ICD-10-CM | POA: Diagnosis not present

## 2018-05-30 DIAGNOSIS — D2262 Melanocytic nevi of left upper limb, including shoulder: Secondary | ICD-10-CM | POA: Diagnosis not present

## 2018-06-21 DIAGNOSIS — R69 Illness, unspecified: Secondary | ICD-10-CM | POA: Diagnosis not present

## 2018-10-10 ENCOUNTER — Other Ambulatory Visit: Payer: Self-pay

## 2018-10-10 ENCOUNTER — Encounter: Payer: Self-pay | Admitting: Family Medicine

## 2018-10-10 ENCOUNTER — Ambulatory Visit (INDEPENDENT_AMBULATORY_CARE_PROVIDER_SITE_OTHER): Payer: Medicare HMO | Admitting: Family Medicine

## 2018-10-10 VITALS — BP 134/67 | HR 58 | Temp 98.1°F | Ht 72.0 in | Wt 192.8 lb

## 2018-10-10 DIAGNOSIS — J069 Acute upper respiratory infection, unspecified: Secondary | ICD-10-CM | POA: Diagnosis not present

## 2018-10-10 NOTE — Progress Notes (Signed)
  Subjective:     Patient ID: Glenn Perez, male   DOB: 08/09/1946, 73 y.o.   MRN: [id number] Chief Complaint  Patient presents with  . Sinusitis    a lot of drainage, sore throat, yellow mucus, swollen sinusis since 10/05/18   HPI Reports cold symptoms with chills but no fever. Has tried Mucinex and a decongestant. No hx of sinus surgery.  Review of Systems     Objective:   Physical Exam Constitutional:      General: He is not in acute distress.    Appearance: He is not ill-appearing.   Ears: T.M's intact without inflammation Sinuses: mild maxillary sinus tenderness Throat: no tonsillar enlargement or exudate Neck: no cervical adenopathy Lungs: clear     Assessment:    1. URI, acute    Plan:    Discussed use of Mucinex D for congestion, Delsym for cough, and Benadryl for postnasal drainage. May also try fluticasone spray for inflammation. Call mid-week if sinuses not improving

## 2018-10-10 NOTE — Patient Instructions (Signed)
Discussed use of Mucinex D for congestion, Delsym for cough, and Benadryl for postnasal drainage. May also add Fluticasone nasal spray. Call me Wednesday if sinuses not improving.

## 2018-10-12 ENCOUNTER — Other Ambulatory Visit: Payer: Self-pay | Admitting: Family Medicine

## 2018-10-12 ENCOUNTER — Telehealth: Payer: Self-pay | Admitting: Family Medicine

## 2018-10-12 MED ORDER — AMOXICILLIN-POT CLAVULANATE 875-125 MG PO TABS: 1.0000 | ORAL_TABLET | Freq: Two times a day (BID) | ORAL | 0 refills | Status: DC

## 2018-10-12 NOTE — Telephone Encounter (Signed)
Antibiotic sent in

## 2018-10-12 NOTE — Telephone Encounter (Signed)
Patient seen in office on 10/10/18 please advise further instructions for patient.KW

## 2018-10-12 NOTE — Telephone Encounter (Signed)
Patient is not any better. Coughing, sinus issues.  Having discolored mucous.  Wants an antibiotic called in. Pt uses [facility] on [address].

## 2018-10-18 DIAGNOSIS — M25572 Pain in left ankle and joints of left foot: Secondary | ICD-10-CM | POA: Diagnosis not present

## 2018-10-19 ENCOUNTER — Ambulatory Visit (INDEPENDENT_AMBULATORY_CARE_PROVIDER_SITE_OTHER): Payer: Medicare HMO | Admitting: Family Medicine

## 2018-10-19 ENCOUNTER — Ambulatory Visit (INDEPENDENT_AMBULATORY_CARE_PROVIDER_SITE_OTHER): Payer: Medicare HMO

## 2018-10-19 VITALS — BP 122/76 | HR 58 | Temp 98.0°F | Resp 16 | Ht 72.0 in | Wt 190.0 lb

## 2018-10-19 VITALS — BP 122/76 | HR 58 | Temp 98.0°F | Ht 72.0 in | Wt 190.8 lb

## 2018-10-19 DIAGNOSIS — E785 Hyperlipidemia, unspecified: Secondary | ICD-10-CM

## 2018-10-19 DIAGNOSIS — M109 Gout, unspecified: Secondary | ICD-10-CM | POA: Diagnosis not present

## 2018-10-19 DIAGNOSIS — R946 Abnormal results of thyroid function studies: Secondary | ICD-10-CM | POA: Diagnosis not present

## 2018-10-19 DIAGNOSIS — Z0000 Encounter for general adult medical examination without abnormal findings: Secondary | ICD-10-CM | POA: Diagnosis not present

## 2018-10-19 DIAGNOSIS — F4320 Adjustment disorder, unspecified: Secondary | ICD-10-CM

## 2018-10-19 DIAGNOSIS — M25579 Pain in unspecified ankle and joints of unspecified foot: Secondary | ICD-10-CM | POA: Diagnosis not present

## 2018-10-19 DIAGNOSIS — Z125 Encounter for screening for malignant neoplasm of prostate: Secondary | ICD-10-CM

## 2018-10-19 DIAGNOSIS — E039 Hypothyroidism, unspecified: Secondary | ICD-10-CM

## 2018-10-19 DIAGNOSIS — R69 Illness, unspecified: Secondary | ICD-10-CM | POA: Diagnosis not present

## 2018-10-19 NOTE — Progress Notes (Signed)
Subjective:   Glenn Perez is a 73 y.o. male who presents for an Initial Medicare Annual Wellness Visit.  Review of Systems  N/A  Cardiac Risk Factors include: advanced age (>58men, >28 women);male gender    Objective:    Today's Vitals   10/19/18 0829 10/19/18 0835  BP: 122/76   Pulse: (!) 58   Temp: 98 F (36.7 C)   TempSrc: Oral   Weight: 190 lb 12.8 oz (86.5 kg)   Height: 6' (1.829 m)   PainSc: 0-No pain 0-No pain   Body mass index is 25.88 kg/m.  Advanced Directives 10/19/2018 12/14/2017  Does Patient Have a Medical Advance Directive? No No  Would patient like information on creating a medical advance directive? No - Patient declined -    Current Medications (verified) Outpatient Encounter Medications as of 10/19/2018  Medication Sig  . amoxicillin-clavulanate (AUGMENTIN) 875-125 MG tablet Take 1 tablet by mouth 2 (two) times daily.  . fluticasone (FLONASE) 50 MCG/ACT nasal spray Place 2 sprays into both nostrils as needed.   Marland Kitchen ipratropium (ATROVENT) 0.06 % nasal spray 2 sprays as needed.   . naproxen (EC NAPROSYN) 500 MG EC tablet Take 500 mg by mouth 2 (two) times daily with a meal.    No facility-administered encounter medications on file as of 10/19/2018.     Allergies (verified) Patient has no known allergies.   History: History reviewed. No pertinent past medical history. Past Surgical History:  Procedure Laterality Date  . APPENDECTOMY    . KNEE SURGERY    . NOSE SURGERY     x 2.  . REPLACEMENT TOTAL KNEE    . SHOULDER SURGERY  10/2015   for partially torn rotator cuff and detached bicep tendon.   Family History  Problem Relation Age of Onset  . Diverticulosis Mother   . Cancer Brother        unsure of what kind   Social History   Socioeconomic History  . Marital status: Married    Spouse name: Not on file  . Number of children: 2  . Years of education: Not on file  . Highest education level: Master's degree (e.g., MA, MS, MEng,  MEd, MSW, MBA)  Occupational History  . Occupation: retired  Scientific laboratory technician  . Financial resource strain: Not hard at all  . Food insecurity:    Worry: Never true    Inability: Never true  . Transportation needs:    Medical: No    Non-medical: No  Tobacco Use  . Smoking status: Never Smoker  . Smokeless tobacco: Never Used  Substance and Sexual Activity  . Alcohol use: Yes    Comment: occasional 1/2 beer  . Drug use: No  . Sexual activity: Not on file  Lifestyle  . Physical activity:    Days per week: 5 days    Minutes per session: 110 min  . Stress: Not at all  Relationships  . Social connections:    Talks on phone: Patient refused    Gets together: Patient refused    Attends religious service: Patient refused    Active member of club or organization: Patient refused    Attends meetings of clubs or organizations: Patient refused    Relationship status: Patient refused  Other Topics Concern  . Not on file  Social History Narrative  . Not on file   Tobacco Counseling Counseling given: Not Answered   Clinical Intake:  Pre-visit preparation completed: Yes  Pain : No/denies pain Pain  Score: 0-No pain     Nutritional Status: BMI 25 -29 Overweight Nutritional Risks: Nausea/ vomitting/ diarrhea(Diarrhea recently due to food.) Diabetes: No  How often do you need to have someone help you when you read instructions, pamphlets, or other written materials from your doctor or pharmacy?: 1 - Never  Interpreter Needed?: No  Information entered by :: [facility], LPN  Activities of Daily Living In your present state of health, do you have any difficulty performing the following activities: 10/19/2018  Hearing? Y  Comment Trouble hearing high frequency sounds. Does not wear hearing aids.   Vision? N  Difficulty concentrating or making decisions? N  Walking or climbing stairs? N  Dressing or bathing? N  Doing errands, shopping? N  Preparing Food and eating ? N  Using  the Toilet? N  In the past six months, have you accidently leaked urine? N  Do you have problems with loss of bowel control? N  Managing your Medications? N  Managing your Finances? N  Housekeeping or managing your Housekeeping? N  Some recent data might be hidden     Immunizations and Health Maintenance Immunization History  Administered Date(s) Administered  . Tdap 08/16/2017   Health Maintenance Due  Topic Date Due  . PNA vac Low Risk Adult (1 of 2 - PCV13) 10/01/2012    Patient Care Team: Jerrol Banana., MD as PCP - General (Family Medicine) Leim Fabry, MD as Consulting Physician (Orthopedic Surgery) Dingeldein, Remo Lipps, MD (Ophthalmology)  Indicate any recent Medical Services you may have received from other than [facility] providers in the past year (date may be approximate).    Assessment:   This is a routine wellness examination for [name].  Hearing/Vision screen No exam data present  Dietary issues and exercise activities discussed: Current Exercise Habits: Structured exercise class, Type of exercise: Other - see comments;strength training/weights;stretching;walking(tennis), Time (Minutes): > 60, Frequency (Times/Week): 5, Weekly Exercise (Minutes/Week): 0, Intensity: Moderate, Exercise limited by: None identified  Goals    . DIET - INCREASE WATER INTAKE     Recommend to drink at least 6-8 8oz glasses of water per day.      Depression Screen PHQ 2/9 Scores 10/19/2018 10/19/2018 10/10/2018 08/16/2017  PHQ - 2 Score 0 0 0 0  PHQ- 9 Score 2 - - -    Fall Risk Fall Risk  10/19/2018 10/10/2018 08/16/2017  Falls in the past year? 0 0 No  Injury with Fall? 0 - -    FALL RISK PREVENTION PERTAINING TO THE HOME:  Any stairs in or around the home WITH handrails? Yes  Home free of loose throw rugs in walkways, pet beds, electrical cords, etc? Yes  Adequate lighting in your home to reduce risk of falls? Yes   ASSISTIVE DEVICES UTILIZED TO PREVENT FALLS:  Life  alert? No  Use of a cane, walker or w/c? No  Grab bars in the bathroom? No  Shower chair or bench in shower? No  Elevated toilet seat or a handicapped toilet? No    TIMED UP AND GO:  Was the test performed? No .     Cognitive Function: Declined today.         Screening Tests Health Maintenance  Topic Date Due  . PNA vac Low Risk Adult (1 of 2 - PCV13) 10/01/2012  . INFLUENZA VACCINE  12/20/2018 (Originally 04/21/2018)  . COLONOSCOPY  10/07/2022  . TETANUS/TDAP  08/17/2027  . Hepatitis C Screening  Completed    Qualifies for Shingles Vaccine?  Yes . Due for Shingrix. Education has been provided regarding the importance of this vaccine. Pt has been advised to call insurance company to determine out of pocket expense. Advised may also receive vaccine at local pharmacy or Health Dept. Verbalized acceptance and understanding.  Tdap: Up to date  Flu Vaccine: Due for Flu vaccine. Does the patient want to receive this vaccine today?  No . Education has been provided regarding the importance of this vaccine but still declined. Advised may receive this vaccine at local pharmacy or Health Dept. Aware to provide a copy of the vaccination record if obtained from local pharmacy or Health Dept. Verbalized acceptance and understanding.  Pneumococcal Vaccine: Due for Pneumococcal vaccine. Does the patient want to receive this vaccine today?  No . Education has been provided regarding the importance of this vaccine but still declined. Advised may receive this vaccine at local pharmacy or Health Dept. Aware to provide a copy of the vaccination record if obtained from local pharmacy or Health Dept. Verbalized acceptance and understanding.   Cancer Screenings:  Colorectal Screening: Completed 10/07/17. Repeat every 5 years.  Lung Cancer Screening: (Low Dose CT Chest recommended if Age 34-80 years, 30 pack-year currently smoking OR have quit w/in 15years.) does not qualify.    Additional  Screening:  Hepatitis C Screening: Up to date  Vision Screening: Recommended annual ophthalmology exams for early detection of glaucoma and other disorders of the eye.  Dental Screening: Recommended annual dental exams for proper oral hygiene  Community Resource Referral:  CRR required this visit?  No        Plan:  I have personally reviewed and addressed the Medicare Annual Wellness questionnaire and have noted the following in the patient's chart:  A. Medical and social history B. Use of alcohol, tobacco or illicit drugs  C. Current medications and supplements D. Functional ability and status E.  Nutritional status F.  Physical activity G. Advance directives H. List of other physicians I.  Hospitalizations, surgeries, and ER visits in previous 12 months J.  [place] such as hearing and vision if needed, cognitive and depression L. Referrals and appointments - none  In addition, I have reviewed and discussed with patient certain preventive protocols, quality metrics, and best practice recommendations. A written personalized care plan for preventive services as well as general preventive health recommendations were provided to patient.  See attached scanned questionnaire for additional information.   Signed,  Fabio Neighbors, LPN Nurse Health Advisor   Nurse Recommendations: Pt declined the influenza and pneumonia vaccine today.

## 2018-10-19 NOTE — Patient Instructions (Signed)
Glenn Perez , Thank you for taking time to come for your Medicare Wellness Visit. I appreciate your ongoing commitment to your health goals. Please review the following plan we discussed and let me know if I can assist you in the future.   Screening recommendations/referrals: Colonoscopy: Up to date, due 09/2022 Recommended yearly ophthalmology/optometry visit for glaucoma screening and checkup Recommended yearly dental visit for hygiene and checkup  Vaccinations: Influenza vaccine: Pt declines today.  Pneumococcal vaccine: Pt declines today.  Tdap vaccine: Up to date, due 07/2027 Shingles vaccine: Pt declines today.     Advanced directives: Advance directive discussed with you today. Even though you declined this today please call our office should you change your mind and we can give you the proper paperwork for you to fill out.  Conditions/risks identified: Recommend to drink at least 6-8 8oz glasses of water per day.  Next appointment: 9:40 AM today with Dr Rosanna Randy. Declined scheduling an AWV for 2021.  Preventive Care 71 Years and Older, Male Preventive care refers to lifestyle choices and visits with your health care provider that can promote health and wellness. What does preventive care include?  A yearly physical exam. This is also called an annual well check.  Dental exams once or twice a year.  Routine eye exams. Ask your health care provider how often you should have your eyes checked.  Personal lifestyle choices, including:  Daily care of your teeth and gums.  Regular physical activity.  Eating a healthy diet.  Avoiding tobacco and drug use.  Limiting alcohol use.  Practicing safe sex.  Taking low doses of aspirin every day.  Taking vitamin and mineral supplements as recommended by your health care provider. What happens during an annual well check? The services and screenings done by your health care provider during your annual well check will depend on  your age, overall health, lifestyle risk factors, and family history of disease. Counseling  Your health care provider may ask you questions about your:  Alcohol use.  Tobacco use.  Drug use.  Emotional well-being.  Home and relationship well-being.  Sexual activity.  Eating habits.  History of falls.  Memory and ability to understand (cognition).  Work and work Statistician. Screening  You may have the following tests or measurements:  Height, weight, and BMI.  Blood pressure.  Lipid and cholesterol levels. These may be checked every 5 years, or more frequently if you are over 5 years old.  Skin check.  Lung cancer screening. You may have this screening every year starting at age 2 if you have a 30-pack-year history of smoking and currently smoke or have quit within the past 15 years.  Fecal occult blood test (FOBT) of the stool. You may have this test every year starting at age 61.  Flexible sigmoidoscopy or colonoscopy. You may have a sigmoidoscopy every 5 years or a colonoscopy every 10 years starting at age 32.  Prostate cancer screening. Recommendations will vary depending on your family history and other risks.  Hepatitis C blood test.  Hepatitis B blood test.  Sexually transmitted disease (STD) testing.  Diabetes screening. This is done by checking your blood sugar (glucose) after you have not eaten for a while (fasting). You may have this done every 1-3 years.  Abdominal aortic aneurysm (AAA) screening. You may need this if you are a current or former smoker.  Osteoporosis. You may be screened starting at age 55 if you are at high risk. Talk with your health  care provider about your test results, treatment options, and if necessary, the need for more tests. Vaccines  Your health care provider may recommend certain vaccines, such as:  Influenza vaccine. This is recommended every year.  Tetanus, diphtheria, and acellular pertussis (Tdap, Td) vaccine.  You may need a Td booster every 10 years.  Zoster vaccine. You may need this after age 36.  Pneumococcal 13-valent conjugate (PCV13) vaccine. One dose is recommended after age 61.  Pneumococcal polysaccharide (PPSV23) vaccine. One dose is recommended after age 1. Talk to your health care provider about which screenings and vaccines you need and how often you need them. This information is not intended to replace advice given to you by your health care provider. Make sure you discuss any questions you have with your health care provider. Document Released: 10/04/2015 Document Revised: 05/27/2016 Document Reviewed: 07/09/2015 Elsevier Interactive Patient Education  2017 [place] Prevention in the Home Falls can cause injuries. They can happen to people of all ages. There are many things you can do to make your home safe and to help prevent falls. What can I do on the outside of my home?  Regularly fix the edges of walkways and driveways and fix any cracks.  Remove anything that might make you trip as you walk through a door, such as a raised step or threshold.  Trim any bushes or trees on the path to your home.  Use bright outdoor lighting.  Clear any walking paths of anything that might make someone trip, such as rocks or tools.  Regularly check to see if handrails are loose or broken. Make sure that both sides of any steps have handrails.  Any raised decks and porches should have guardrails on the edges.  Have any leaves, snow, or ice cleared regularly.  Use sand or salt on walking paths during winter.  Clean up any spills in your garage right away. This includes oil or grease spills. What can I do in the bathroom?  Use night lights.  Install grab bars by the toilet and in the tub and shower. Do not use towel bars as grab bars.  Use non-skid mats or decals in the tub or shower.  If you need to sit down in the shower, use a plastic, non-slip stool.  Keep the  floor dry. Clean up any water that spills on the floor as soon as it happens.  Remove soap buildup in the tub or shower regularly.  Attach bath mats securely with double-sided non-slip rug tape.  Do not have throw rugs and other things on the floor that can make you trip. What can I do in the bedroom?  Use night lights.  Make sure that you have a light by your bed that is easy to reach.  Do not use any sheets or blankets that are too big for your bed. They should not hang down onto the floor.  Have a firm chair that has side arms. You can use this for support while you get dressed.  Do not have throw rugs and other things on the floor that can make you trip. What can I do in the kitchen?  Clean up any spills right away.  Avoid walking on wet floors.  Keep items that you use a lot in easy-to-reach places.  If you need to reach something above you, use a strong step stool that has a grab bar.  Keep electrical cords out of the way.  Do not use floor  polish or wax that makes floors slippery. If you must use wax, use non-skid floor wax.  Do not have throw rugs and other things on the floor that can make you trip. What can I do with my stairs?  Do not leave any items on the stairs.  Make sure that there are handrails on both sides of the stairs and use them. Fix handrails that are broken or loose. Make sure that handrails are as long as the stairways.  Check any carpeting to make sure that it is firmly attached to the stairs. Fix any carpet that is loose or worn.  Avoid having throw rugs at the top or bottom of the stairs. If you do have throw rugs, attach them to the floor with carpet tape.  Make sure that you have a light switch at the top of the stairs and the bottom of the stairs. If you do not have them, ask someone to add them for you. What else can I do to help prevent falls?  Wear shoes that:  Do not have high heels.  Have rubber bottoms.  Are comfortable and fit  you well.  Are closed at the toe. Do not wear sandals.  If you use a stepladder:  Make sure that it is fully opened. Do not climb a closed stepladder.  Make sure that both sides of the stepladder are locked into place.  Ask someone to hold it for you, if possible.  Clearly mark and make sure that you can see:  Any grab bars or handrails.  First and last steps.  Where the edge of each step is.  Use tools that help you move around (mobility aids) if they are needed. These include:  Canes.  Walkers.  Scooters.  Crutches.  Turn on the lights when you go into a dark area. Replace any light bulbs as soon as they burn out.  Set up your furniture so you have a clear path. Avoid moving your furniture around.  If any of your floors are uneven, fix them.  If there are any pets around you, be aware of where they are.  Review your medicines with your doctor. Some medicines can make you feel dizzy. This can increase your chance of falling. Ask your doctor what other things that you can do to help prevent falls. This information is not intended to replace advice given to you by your health care provider. Make sure you discuss any questions you have with your health care provider. Document Released: 07/04/2009 Document Revised: 02/13/2016 Document Reviewed: 10/12/2014 Elsevier Interactive Patient Education  2017 [employer].

## 2018-10-19 NOTE — Progress Notes (Signed)
Patient: Glenn Perez, Male    DOB: Apr 24, 1946, 73 y.o.   MRN: [id number] Visit Date: 10/19/2018  Today's Provider: Wilhemena Durie, MD   Chief Complaint  Patient presents with  . Annual Exam   Subjective:  Glenn Perez is a 73 y.o. male who presents today for health maintenance and complete physical. He feels well. He reports exercising 5-6 times weekly. He reports he is sleeping well.  He has a chronic ankle problem followed by Dr. Posey Pronto and he thinks he might have gout.  Pain he describes is not consistent with gout.  Is mild and chronic.  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  He is not suicidal or homicidal or depressed. He thinks he has PTSD. Immunization History  Administered Date(s) Administered  . Tdap 08/16/2017   10/07/17 Colonoscopy, Elliott-Tubular adenoma, repeat 5 years.  Review of Systems  Constitutional: Negative.   HENT: Positive for hearing loss and postnasal drip.   Eyes: Negative.   Respiratory: Negative.   Cardiovascular: Negative.   Gastrointestinal: Negative.   Endocrine: Negative.   Genitourinary: Negative.   Musculoskeletal: Negative.   Skin: Negative.   Allergic/Immunologic: Negative.   Neurological: Negative.   Hematological: Negative.   Psychiatric/Behavioral: Negative.   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  Social History   Socioeconomic History  . Marital status: Married    Spouse name: Not on file  . Number of children: 2  . Years of education: Not on file  . Highest education level: Master's degree (e.g., MA, MS, MEng, MEd, MSW, MBA)  Occupational History  . Occupation: retired  Scientific laboratory technician  . Financial resource strain: Not hard at all  . Food insecurity:    Worry: Never true    Inability: Never true  . Transportation needs:    Medical: No    Non-medical: No  Tobacco Use  . Smoking status: Never Smoker  . Smokeless tobacco: Never  Used  Substance and Sexual Activity  . Alcohol use: Yes    Comment: occasional 1/2 beer  . Drug use: No  . Sexual activity: Not on file  Lifestyle  . Physical activity:    Days per week: 5 days    Minutes per session: 110 min  . Stress: Not at all  Relationships  . Social connections:    Talks on phone: Patient refused    Gets together: Patient refused    Attends religious service: Patient refused    Active member of club or organization: Patient refused    Attends meetings of clubs or organizations: Patient refused    Relationship status: Patient refused  . Intimate partner violence:    Fear of current or ex partner: Patient refused    Emotionally abused: Patient refused    Physically abused: Patient refused    Forced sexual activity: Patient refused  Other Topics Concern  . Not on file  Social History Narrative  . Not on file    Patient Active Problem List   Diagnosis Date Noted  . Arthritis 07/30/2017  . Chronic rhinitis 07/30/2017    Past Surgical History:  Procedure Laterality Date  . APPENDECTOMY    . KNEE SURGERY    . NOSE SURGERY     x 2.  . REPLACEMENT TOTAL KNEE    . SHOULDER SURGERY  10/2015   for partially torn rotator cuff and detached bicep tendon.  His family history includes Cancer in his brother; Diverticulosis in his mother.     Outpatient Encounter Medications as of 10/19/2018  Medication Sig  . amoxicillin-clavulanate (AUGMENTIN) 875-125 MG tablet Take 1 tablet by mouth 2 (two) times daily.  . fluticasone (FLONASE) 50 MCG/ACT nasal spray Place 2 sprays into both nostrils as needed.   Marland Kitchen ipratropium (ATROVENT) 0.06 % nasal spray 2 sprays as needed.   . naproxen (EC NAPROSYN) 500 MG EC tablet Take 500 mg by mouth 2 (two) times daily with a meal.    No facility-administered encounter medications on file as of 10/19/2018.     Patient Care Team: Jerrol Banana., MD as PCP - General (Family Medicine) Leim Fabry, MD as Consulting  Physician (Orthopedic Surgery) Estill Cotta, MD (Ophthalmology)      Objective:   Vitals:  Vitals:   10/19/18 0932  BP: 122/76  Pulse: (!) 58  Resp: 16  Temp: 98 F (36.7 C)  TempSrc: Oral  Weight: 190 lb (86.2 kg)  Height: 6' (1.829 m)    Physical Exam Constitutional:      Appearance: Normal appearance. He is normal weight.  HENT:     Head: Normocephalic and atraumatic.     Right Ear: Tympanic membrane, ear canal and external ear normal.     Left Ear: Tympanic membrane, ear canal and external ear normal.     Nose: Nose normal.     Mouth/Throat:     Mouth: Mucous membranes are moist.     Pharynx: Oropharynx is clear.  Eyes:     Extraocular Movements: Extraocular movements intact.     Conjunctiva/sclera: Conjunctivae normal.     Pupils: Pupils are equal, round, and reactive to light.  Neck:     Musculoskeletal: Normal range of motion and neck supple.  Cardiovascular:     Rate and Rhythm: Normal rate and regular rhythm.     Pulses: Normal pulses.     Heart sounds: Normal heart sounds.  Pulmonary:     Effort: Pulmonary effort is normal.     Breath sounds: Normal breath sounds.  Abdominal:     General: Abdomen is flat. Bowel sounds are normal.     Palpations: Abdomen is soft.  Musculoskeletal: Normal range of motion.  Skin:    General: Skin is warm and dry.  Neurological:     General: No focal deficit present.     Mental Status: He is alert and oriented to person, place, and time. Mental status is at baseline.  Psychiatric:        Mood and Affect: Mood normal.        Behavior: Behavior normal.        Thought Content: Thought content normal.        Judgment: Judgment normal.     Assessment & Plan:     Routine Health Maintenance and Physical Exam  Exercise Activities and Dietary recommendations Goals    . DIET - INCREASE WATER INTAKE     Recommend to drink at least 6-8 8oz glasses of water per day.       Immunization History  Administered  Date(s) Administered  . Tdap 08/16/2017    Health Maintenance  Topic Date Due  . PNA vac Low Risk Adult (1 of 2 - PCV13) 10/01/2012  . INFLUENZA VACCINE  12/20/2018 (Originally 04/21/2018)  . COLONOSCOPY  10/07/2022  . TETANUS/TDAP  08/17/2027  . Hepatitis C Screening  Completed     Discussed health benefits of physical activity,  and encouraged him to engage in regular exercise appropriate for his age and condition.  1. Encounter for annual physical exam Healthy 73 year old.  He plays tennis regularly.  2. Borderline hypothyroidism  - CBC with Differential/Platelet - TSH  3. Borderline hyperlipidemia  - Comprehensive metabolic panel - Lipid Panel With LDL/HDL Ratio  4. Gout, unspecified cause, unspecified chronicity, unspecified site Unlikely. - Uric acid  5. Prostate cancer screening  - PSA  6. Adjustment disorder, unspecified type Recommended counseling.  7. Ankle pain, unspecified chronicity, unspecified laterality Per Dr. Posey Pronto from orthopedics  I have done the exam and reviewed the chart and it is accurate to the best of my knowledge. Development worker, community has been used and  any errors in dictation or transcription are unintentional. Miguel Aschoff M.D. [facility]        HPI, Exam and A&P Transcribed under the direction and in the presence of Wilhemena Durie., MD. Electronically Signed: Althea Charon, [place]

## 2018-10-20 DIAGNOSIS — R946 Abnormal results of thyroid function studies: Secondary | ICD-10-CM | POA: Diagnosis not present

## 2018-10-20 DIAGNOSIS — M109 Gout, unspecified: Secondary | ICD-10-CM | POA: Diagnosis not present

## 2018-10-20 DIAGNOSIS — E785 Hyperlipidemia, unspecified: Secondary | ICD-10-CM | POA: Diagnosis not present

## 2018-10-20 DIAGNOSIS — Z125 Encounter for screening for malignant neoplasm of prostate: Secondary | ICD-10-CM | POA: Diagnosis not present

## 2018-10-21 LAB — LIPID PANEL WITH LDL/HDL RATIO
Cholesterol, Total: 160 mg/dL (ref 100–199)
HDL: 43 mg/dL (ref >39)
LDL Calculated: 95 mg/dL (ref 0–99)
LDl/HDL Ratio: 2.2 ratio (ref 0.0–3.6)
Triglycerides: 109 mg/dL (ref 0–149)
VLDL Cholesterol Cal: 22 mg/dL (ref 5–40)

## 2018-10-21 LAB — CBC WITH DIFFERENTIAL/PLATELET
Basophils Absolute: 0 10*3/uL (ref 0.0–0.2)
Basos: 1 %
EOS (ABSOLUTE): 0.3 10*3/uL (ref 0.0–0.4)
Eos: 4 %
Hematocrit: 41.4 % (ref 37.5–51.0)
Hemoglobin: 13.6 g/dL (ref 13.0–17.7)
Immature Grans (Abs): 0 10*3/uL (ref 0.0–0.1)
Immature Granulocytes: 1 %
Lymphocytes Absolute: 1.6 10*3/uL (ref 0.7–3.1)
Lymphs: 27 %
MCH: 27.6 pg (ref 26.6–33.0)
MCHC: 32.9 g/dL (ref 31.5–35.7)
MCV: 84 fL (ref 79–97)
Monocytes Absolute: 0.5 10*3/uL (ref 0.1–0.9)
Monocytes: 7 %
Neutrophils Absolute: 3.7 10*3/uL (ref 1.4–7.0)
Neutrophils: 60 %
Platelets: 355 10*3/uL (ref 150–450)
RBC: 4.93 x10E6/uL (ref 4.14–5.80)
RDW: 13.5 % (ref 11.6–15.4)
WBC: 6.1 10*3/uL (ref 3.4–10.8)

## 2018-10-21 LAB — COMPREHENSIVE METABOLIC PANEL
ALT: 23 IU/L (ref 0–44)
AST: 18 IU/L (ref 0–40)
Albumin/Globulin Ratio: 1.8 (ref 1.2–2.2)
Albumin: 4 g/dL (ref 3.7–4.7)
Alkaline Phosphatase: 146 IU/L — ABNORMAL HIGH (ref 39–117)
BUN/Creatinine Ratio: 21 (ref 10–24)
BUN: 18 mg/dL (ref 8–27)
Bilirubin Total: 0.3 mg/dL (ref 0.0–1.2)
CO2: 22 mmol/L (ref 20–29)
Calcium: 9.2 mg/dL (ref 8.6–10.2)
Chloride: 100 mmol/L (ref 96–106)
Creatinine, Ser: 0.85 mg/dL (ref 0.76–1.27)
GFR calc Af Amer: 101 mL/min/{1.73_m2} (ref >59)
GFR calc non Af Amer: 88 mL/min/{1.73_m2} (ref >59)
GLUCOSE: 86 mg/dL (ref 65–99)
Globulin, Total: 2.2 g/dL (ref 1.5–4.5)
Potassium: 4.8 mmol/L (ref 3.5–5.2)
Sodium: 141 mmol/L (ref 134–144)
Total Protein: 6.2 g/dL (ref 6.0–8.5)

## 2018-10-21 LAB — TSH: TSH: 4.49 u[IU]/mL (ref 0.450–4.500)

## 2018-10-21 LAB — PSA: Prostate Specific Ag, Serum: 4 ng/mL (ref 0.0–4.0)

## 2018-10-21 LAB — URIC ACID: URIC ACID: 5.2 mg/dL (ref 3.7–8.6)

## 2018-10-24 ENCOUNTER — Telehealth: Payer: Self-pay

## 2018-10-24 NOTE — Telephone Encounter (Signed)
-----   Message from Jerrol Banana., MD sent at 10/21/2018 12:45 PM EST ----- Labs all in normal range.  Uric acid also normal.

## 2018-10-24 NOTE — Telephone Encounter (Signed)
Patient was advised.  

## 2018-11-12 DIAGNOSIS — J0100 Acute maxillary sinusitis, unspecified: Secondary | ICD-10-CM | POA: Diagnosis not present

## 2018-11-16 DIAGNOSIS — J324 Chronic pansinusitis: Secondary | ICD-10-CM | POA: Diagnosis not present

## 2018-11-30 DIAGNOSIS — H6122 Impacted cerumen, left ear: Secondary | ICD-10-CM | POA: Diagnosis not present

## 2018-11-30 DIAGNOSIS — H903 Sensorineural hearing loss, bilateral: Secondary | ICD-10-CM | POA: Diagnosis not present

## 2018-11-30 DIAGNOSIS — J324 Chronic pansinusitis: Secondary | ICD-10-CM | POA: Diagnosis not present

## 2019-02-03 ENCOUNTER — Other Ambulatory Visit: Payer: Self-pay

## 2019-02-03 ENCOUNTER — Emergency Department: Payer: Medicare HMO

## 2019-02-03 ENCOUNTER — Observation Stay
Admission: EM | Admit: 2019-02-03 | Discharge: 2019-02-04 | Disposition: A | Discharge status: Home / Self Care | Payer: Medicare HMO | Attending: Internal Medicine | Admitting: Internal Medicine

## 2019-02-03 ENCOUNTER — Encounter: Payer: Self-pay | Admitting: Emergency Medicine

## 2019-02-03 DIAGNOSIS — S0083XA Contusion of other part of head, initial encounter: Secondary | ICD-10-CM | POA: Diagnosis not present

## 2019-02-03 DIAGNOSIS — I4891 Unspecified atrial fibrillation: Principal | ICD-10-CM | POA: Insufficient documentation

## 2019-02-03 DIAGNOSIS — S199XXA Unspecified injury of neck, initial encounter: Secondary | ICD-10-CM | POA: Diagnosis not present

## 2019-02-03 DIAGNOSIS — W19XXXA Unspecified fall, initial encounter: Secondary | ICD-10-CM | POA: Diagnosis not present

## 2019-02-03 DIAGNOSIS — E860 Dehydration: Secondary | ICD-10-CM | POA: Diagnosis not present

## 2019-02-03 DIAGNOSIS — R55 Syncope and collapse: Secondary | ICD-10-CM | POA: Diagnosis present

## 2019-02-03 DIAGNOSIS — I480 Paroxysmal atrial fibrillation: Secondary | ICD-10-CM | POA: Diagnosis not present

## 2019-02-03 DIAGNOSIS — Z20828 Contact with and (suspected) exposure to other viral communicable diseases: Secondary | ICD-10-CM | POA: Diagnosis not present

## 2019-02-03 DIAGNOSIS — Z1159 Encounter for screening for other viral diseases: Secondary | ICD-10-CM | POA: Diagnosis not present

## 2019-02-03 DIAGNOSIS — R000 Tachycardia, unspecified: Secondary | ICD-10-CM | POA: Diagnosis not present

## 2019-02-03 HISTORY — DX: Syncope and collapse: R55

## 2019-02-03 LAB — BASIC METABOLIC PANEL
Anion gap: 10 (ref 5–15)
BUN: 19 mg/dL (ref 8–23)
CO2: 24 mmol/L (ref 22–32)
Calcium: 8.9 mg/dL (ref 8.9–10.3)
Chloride: 104 mmol/L (ref 98–111)
Creatinine, Ser: 1.02 mg/dL (ref 0.61–1.24)
GFR calc Af Amer: >60 mL/min (ref >60)
GFR calc non Af Amer: >60 mL/min (ref >60)
Glucose, Bld: 107 mg/dL — ABNORMAL HIGH (ref 70–99)
Potassium: 4 mmol/L (ref 3.5–5.1)
Sodium: 138 mmol/L (ref 135–145)

## 2019-02-03 LAB — CBC WITH DIFFERENTIAL/PLATELET
Abs Immature Granulocytes: 0.01 10*3/uL (ref 0.00–0.07)
Basophils Absolute: 0.1 10*3/uL (ref 0.0–0.1)
Basophils Relative: 1 %
Eosinophils Absolute: 0.2 10*3/uL (ref 0.0–0.5)
Eosinophils Relative: 2 %
HCT: 43.3 % (ref 39.0–52.0)
Hemoglobin: 14.5 g/dL (ref 13.0–17.0)
Immature Granulocytes: 0 %
Lymphocytes Relative: 25 %
Lymphs Abs: 1.9 10*3/uL (ref 0.7–4.0)
MCH: 27.9 pg (ref 26.0–34.0)
MCHC: 33.5 g/dL (ref 30.0–36.0)
MCV: 83.3 fL (ref 80.0–100.0)
Monocytes Absolute: 0.8 10*3/uL (ref 0.1–1.0)
Monocytes Relative: 10 %
Neutro Abs: 4.9 10*3/uL (ref 1.7–7.7)
Neutrophils Relative %: 62 %
Platelets: 209 10*3/uL (ref 150–400)
RBC: 5.2 MIL/uL (ref 4.22–5.81)
RDW: 13.6 % (ref 11.5–15.5)
WBC: 7.9 10*3/uL (ref 4.0–10.5)
nRBC: 0 % (ref 0.0–0.2)

## 2019-02-03 LAB — TROPONIN I: Troponin I: <0.03 ng/mL (ref <0.03)

## 2019-02-03 LAB — SARS CORONAVIRUS 2 BY RT PCR (HOSPITAL ORDER, PERFORMED IN ~~LOC~~ HOSPITAL LAB): SARS Coronavirus 2: NEGATIVE

## 2019-02-03 MED ORDER — BACITRACIN-NEOMYCIN-POLYMYXIN 400-5-5000 EX OINT
TOPICAL_OINTMENT | Freq: Once | CUTANEOUS | Status: AC
  Administered 2019-02-03: 1 via TOPICAL

## 2019-02-03 MED ORDER — ONDANSETRON HCL 4 MG/2ML IJ SOLN: 4.0000 mg | Freq: Four times a day (QID) | INTRAMUSCULAR | Status: DC | PRN

## 2019-02-03 MED ORDER — ACETAMINOPHEN 325 MG PO TABS: 650.0000 mg | ORAL_TABLET | Freq: Four times a day (QID) | ORAL | Status: DC | PRN

## 2019-02-03 MED ORDER — SODIUM CHLORIDE 0.9 % IV SOLN
Freq: Once | INTRAVENOUS | Status: AC
  Administered 2019-02-03: 20:00:00 via INTRAVENOUS

## 2019-02-03 MED ORDER — ENOXAPARIN SODIUM 40 MG/0.4ML ~~LOC~~ SOLN: 40.0000 mg | SUBCUTANEOUS | Status: DC

## 2019-02-03 MED ORDER — ACETAMINOPHEN 650 MG RE SUPP: 650.0000 mg | Freq: Four times a day (QID) | RECTAL | Status: DC | PRN

## 2019-02-03 MED ORDER — ONDANSETRON HCL 4 MG PO TABS: 4.0000 mg | ORAL_TABLET | Freq: Four times a day (QID) | ORAL | Status: DC | PRN

## 2019-02-03 MED ORDER — BACITRACIN-NEOMYCIN-POLYMYXIN 400-5-5000 EX OINT
TOPICAL_OINTMENT | CUTANEOUS | Status: AC
  Filled 2019-02-03: qty 1

## 2019-02-03 NOTE — ED Notes (Signed)
ED TO INPATIENT HANDOFF REPORT  ED Nurse Name and Phone #: Anda Kraft [phone]  S Name/Age/Gender Glenn Perez 73 y.o. male Room/Bed: ED04A/ED04A  Code Status   Code Status: Not on file  Home/SNF/Other Home Patient oriented to: self, place, time and situation Is this baseline? Yes   Triage Complete: Triage complete  Chief Complaint loc  Triage Note Patient arrives via EMS after a fall from a seated position after playing tennis this evening. Patient was with a friend who says "he fell forward off the bench, was out for a couple minutes" before coming to. Patient has lacerations on forehead, is alert and oriented at present   Allergies No Known Allergies  Level of Care/Admitting Diagnosis ED Disposition    ED Disposition Condition [place]: [facility] [100120]  Level of Care: Telemetry [5]  Covid Evaluation: N/A  Diagnosis: New onset atrial fibrillation [facility]) [[id number]]  Admitting Physician: Lance Coon [[id number]]  Attending Physician: Jannifer Franklin, DAVID [id number]  Bed request comments: 2a  PT Class (Do Not Modify): Observation [104]  PT Acc Code (Do Not Modify): Observation [10022]       B Medical/Surgery History Past Medical History:  Diagnosis Date  . Syncope    Past Surgical History:  Procedure Laterality Date  . APPENDECTOMY    . KNEE SURGERY    . NOSE SURGERY     x 2.  . REPLACEMENT TOTAL KNEE    . SHOULDER SURGERY  10/2015   for partially torn rotator cuff and detached bicep tendon.     A IV Location/Drains/Wounds Patient Lines/Drains/Airways Status   Active Line/Drains/Airways    Name:   Placement date:   Placement time:   Site:   Days:   Peripheral IV 02/03/19 Right Forearm   02/03/19    -    Forearm   less than 1          Intake/Output Last 24 hours No intake or output data in the 24 hours ending 02/03/19 2244  Labs/Imaging Results for orders placed or performed during the hospital encounter of  02/03/19 (from the past 48 hour(s))  CBC with Differential     Status: None   Collection Time: 02/03/19  7:28 PM  Result Value Ref Range   WBC 7.9 4.0 - 10.5 K/uL   RBC 5.20 4.22 - 5.81 MIL/uL   Hemoglobin 14.5 13.0 - 17.0 g/dL   HCT 43.3 39.0 - 52.0 %   MCV 83.3 80.0 - 100.0 fL   MCH 27.9 26.0 - 34.0 pg   MCHC 33.5 30.0 - 36.0 g/dL   RDW 13.6 11.5 - 15.5 %   Platelets 209 150 - 400 K/uL   nRBC 0.0 0.0 - 0.2 %   Neutrophils Relative % 62 %   Neutro Abs 4.9 1.7 - 7.7 K/uL   Lymphocytes Relative 25 %   Lymphs Abs 1.9 0.7 - 4.0 K/uL   Monocytes Relative 10 %   Monocytes Absolute 0.8 0.1 - 1.0 K/uL   Eosinophils Relative 2 %   Eosinophils Absolute 0.2 0.0 - 0.5 K/uL   Basophils Relative 1 %   Basophils Absolute 0.1 0.0 - 0.1 K/uL   Immature Granulocytes 0 %   Abs Immature Granulocytes 0.01 0.00 - 0.07 K/uL    Comment: Performed at [facility], [address], [place],  [address]  Basic metabolic panel     Status: Abnormal   Collection Time: 02/03/19  7:28 PM  Result Value  Ref Range   Sodium 138 135 - 145 mmol/L   Potassium 4.0 3.5 - 5.1 mmol/L   Chloride 104 98 - 111 mmol/L   CO2 24 22 - 32 mmol/L   Glucose, Bld 107 (H) 70 - 99 mg/dL   BUN 19 8 - 23 mg/dL   Creatinine, Ser 1.02 0.61 - 1.24 mg/dL   Calcium 8.9 8.9 - 10.3 mg/dL   GFR calc non Af Amer >60 >60 mL/min   GFR calc Af Amer >60 >60 mL/min   Anion gap 10 5 - 15    Comment: Performed at [facility], [address] [place], [place] [address]  Troponin I - ONCE - STAT     Status: None   Collection Time: 02/03/19  7:28 PM  Result Value Ref Range   Troponin I <0.03 <0.03 ng/mL    Comment: Performed at [facility], [address] [place],  [address]  SARS Coronavirus 2 [facility] order, Performed in [place] hospital lab)     Status: None   Collection Time: 02/03/19  8:16 PM  Result Value Ref Range   SARS Coronavirus 2 NEGATIVE NEGATIVE    Comment: (NOTE) If result is  NEGATIVE SARS-CoV-2 target nucleic acids are NOT DETECTED. The SARS-CoV-2 RNA is generally detectable in upper and lower  respiratory specimens during the acute phase of infection. The lowest  concentration of SARS-CoV-2 viral copies this assay can detect is 250  copies / mL. A negative result does not preclude SARS-CoV-2 infection  and should not be used as the sole basis for treatment or other  patient management decisions.  A negative result may occur with  improper specimen collection / handling, submission of specimen other  than nasopharyngeal swab, presence of viral mutation(s) within the  areas targeted by this assay, and inadequate number of viral copies  (<250 copies / mL). A negative result must be combined with clinical  observations, patient history, and epidemiological information. If result is POSITIVE SARS-CoV-2 target nucleic acids are DETECTED. The SARS-CoV-2 RNA is generally detectable in upper and lower  respiratory specimens dur ing the acute phase of infection.  Positive  results are indicative of active infection with SARS-CoV-2.  Clinical  correlation with patient history and other diagnostic information is  necessary to determine patient infection status.  Positive results do  not rule out bacterial infection or co-infection with other viruses. If result is PRESUMPTIVE POSTIVE SARS-CoV-2 nucleic acids MAY BE PRESENT.   A presumptive positive result was obtained on the submitted specimen  and confirmed on repeat testing.  While 2019 novel coronavirus  (SARS-CoV-2) nucleic acids may be present in the submitted sample  additional confirmatory testing may be necessary for epidemiological  and / or clinical management purposes  to differentiate between  SARS-CoV-2 and other Sarbecovirus currently known to infect humans.  If clinically indicated additional testing with an alternate test  methodology [phone]) is advised. The SARS-CoV-2 RNA is generally  detectable  in upper and lower respiratory sp ecimens during the acute  phase of infection. The expected result is Negative. Fact Sheet for Patients:  StrictlyIdeas.no Fact Sheet for Healthcare Providers: BankingDealers.co.za This test is not yet approved or cleared by the [place] FDA and has been authorized for detection and/or diagnosis of SARS-CoV-2 by FDA under an Emergency Use Authorization (EUA).  This EUA will remain in effect (meaning this test can be used) for the duration of the COVID-19 declaration under Section 564(b)(1) of the Act, 21  U.S.C. section 360bbb-3(b)(1), unless the authorization is terminated or revoked sooner. Performed at [facility], [place], [place] [address]    Ct Head Wo Contrast  Result Date: 02/03/2019 CLINICAL DATA:  Syncope and fall today.  Blow to the forehead. EXAM: CT HEAD WITHOUT CONTRAST CT CERVICAL SPINE WITHOUT CONTRAST TECHNIQUE: Multidetector CT imaging of the head and cervical spine was performed following the standard protocol without intravenous contrast. Multiplanar CT image reconstructions of the cervical spine were also generated. COMPARISON:  None. FINDINGS: CT HEAD FINDINGS Brain: No evidence of acute infarction, hemorrhage, hydrocephalus, extra-axial collection or mass lesion/mass effect. Vascular: No hyperdense vessel or unexpected calcification. Skull: Intact.  No focal lesion. Sinuses/Orbits: Negative. Other: Soft tissue contusion midline over the forehead noted. CT CERVICAL SPINE FINDINGS Alignment: Maintained with straightening of lordosis noted. Skull base and vertebrae: No acute fracture. No primary bone lesion or focal pathologic process. Soft tissues and spinal canal: No prevertebral fluid or swelling. No visible canal hematoma. Disc levels: Marked multilevel loss of disc space height is identified and appears worst C4-5 and C5-6. Upper chest: Clear. Other: None.  IMPRESSION: Soft tissue contusion on the forehead. Negative for fracture intracranial. No acute abnormality cervical spinal spondylosis Electronically Signed   By: Inge Rise M.D.   On: 02/03/2019 20:10   Ct Cervical Spine Wo Contrast  Result Date: 02/03/2019 CLINICAL DATA:  Syncope and fall today.  Blow to the forehead. EXAM: CT HEAD WITHOUT CONTRAST CT CERVICAL SPINE WITHOUT CONTRAST TECHNIQUE: Multidetector CT imaging of the head and cervical spine was performed following the standard protocol without intravenous contrast. Multiplanar CT image reconstructions of the cervical spine were also generated. COMPARISON:  None. FINDINGS: CT HEAD FINDINGS Brain: No evidence of acute infarction, hemorrhage, hydrocephalus, extra-axial collection or mass lesion/mass effect. Vascular: No hyperdense vessel or unexpected calcification. Skull: Intact.  No focal lesion. Sinuses/Orbits: Negative. Other: Soft tissue contusion midline over the forehead noted. CT CERVICAL SPINE FINDINGS Alignment: Maintained with straightening of lordosis noted. Skull base and vertebrae: No acute fracture. No primary bone lesion or focal pathologic process. Soft tissues and spinal canal: No prevertebral fluid or swelling. No visible canal hematoma. Disc levels: Marked multilevel loss of disc space height is identified and appears worst C4-5 and C5-6. Upper chest: Clear. Other: None. IMPRESSION: Soft tissue contusion on the forehead. Negative for fracture intracranial. No acute abnormality cervical spinal spondylosis Electronically Signed   By: Inge Rise M.D.   On: 02/03/2019 20:10    Pending Labs [employer] (From admission, onward)    Start     Ordered   Signed and Held  CBC  (enoxaparin (LOVENOX)    CrCl >/= 30 ml/min)  Once,   R    Comments:  Baseline for enoxaparin therapy IF NOT ALREADY DRAWN.  Notify MD if PLT < 100 K.    Signed and Held   Signed and Held  Creatinine, serum  (enoxaparin (LOVENOX)    CrCl >/= 30  ml/min)  Once,   R    Comments:  Baseline for enoxaparin therapy IF NOT ALREADY DRAWN.    Signed and Held   Signed and Held  Creatinine, serum  (enoxaparin (LOVENOX)    CrCl >/= 30 ml/min)  Weekly,   R    Comments:  while on enoxaparin therapy    Signed and Held   Signed and Held  Basic metabolic panel  Tomorrow morning,   R     Signed and Held   Signed  and Held  CBC  Tomorrow morning,   R     Signed and Held          Vitals/Pain Today's Vitals   02/03/19 2030 02/03/19 2100 02/03/19 2115 02/03/19 2130  BP: 138/66 119/68  122/76  Pulse:   (!) 31 84  Resp: 18 (!) 21 20 (!) 21  Temp:      TempSrc:      SpO2:   97% 96%  Weight:      Height:      PainSc:        Isolation Precautions No active isolations  Medications Medications  neomycin-bacitracin-polymyxin (NEOSPORIN) ointment packet (1 application Topical Given 02/03/19 2015)  0.9 %  sodium chloride infusion ( Intravenous New Bag/Given 02/03/19 2016)    Mobility walks Moderate fall risk   Focused Assessments Cardiac Assessment Handoff:  Cardiac Rhythm: Sinus tachycardia Lab Results  Component Value Date   TROPONINI <0.03 02/03/2019   No results found for: DDIMER Does the Patient currently have chest pain? No     R Recommendations: See Admitting Provider Note  Report given to:   Additional Notes:

## 2019-02-03 NOTE — H&P (Signed)
[facility] [place] NAME: Glenn Perez    MR#:  [id number]  DATE OF BIRTH:  09/26/1945  DATE OF ADMISSION:  02/03/2019  PRIMARY CARE PHYSICIAN: Jerrol Banana., MD   REQUESTING/REFERRING PHYSICIAN: Archie Balboa, MD  CHIEF COMPLAINT:   Chief Complaint  Patient presents with  . Loss of Consciousness    HISTORY OF PRESENT ILLNESS:  Glenn Perez  is a 73 y.o. male who presents with chief complaint as above.  Patient presents the ED after syncopal episode.  He was playing basketball today and had a syncope.  He has significant abrasion on his forehead and bridge of his nose.  Here in the ED was found to be in A. fib.  This is reportedly a new diagnosis for him.  Chart review reveals that he did have work-up for syncope in 2015 as well, which did not show any significant abnormality.  Hospitalist called for admission and further evaluation  PAST MEDICAL HISTORY:   Past Medical History:  Diagnosis Date  . Syncope      PAST SURGICAL HISTORY:   Past Surgical History:  Procedure Laterality Date  . APPENDECTOMY    . KNEE SURGERY    . NOSE SURGERY     x 2.  . REPLACEMENT TOTAL KNEE    . SHOULDER SURGERY  10/2015   for partially torn rotator cuff and detached bicep tendon.     SOCIAL HISTORY:   Social History   Tobacco Use  . Smoking status: Never Smoker  . Smokeless tobacco: Never Used  Substance Use Topics  . Alcohol use: Yes    Alcohol/week: 1.0 standard drinks    Types: 1 Standard drinks or equivalent per week     FAMILY HISTORY:   Family History  Problem Relation Age of Onset  . Diverticulosis Mother   . Cancer Brother        unsure of what kind     DRUG ALLERGIES:  No Known Allergies  MEDICATIONS AT HOME:   Prior to Admission medications   Medication Sig Start Date End Date Taking? Authorizing Provider  amoxicillin-clavulanate (AUGMENTIN) 875-125 MG tablet Take 1 tablet by mouth 2 (two)  times daily. Patient not taking: Reported on 02/03/2019 10/12/18   Carmon Ginsberg, PA    REVIEW OF SYSTEMS:  Review of Systems  Constitutional: Negative for chills, fever, malaise/fatigue and weight loss.  HENT: Negative for ear pain, hearing loss and tinnitus.   Eyes: Negative for blurred vision, double vision, pain and redness.  Respiratory: Negative for cough, hemoptysis and shortness of breath.   Cardiovascular: Negative for chest pain, palpitations, orthopnea and leg swelling.  Gastrointestinal: Negative for abdominal pain, constipation, diarrhea, nausea and vomiting.  Genitourinary: Negative for dysuria, frequency and hematuria.  Musculoskeletal: Negative for back pain, joint pain and neck pain.  Skin:       No acne, rash, or lesions  Neurological: Positive for loss of consciousness. Negative for dizziness, tremors, focal weakness and weakness.  Endo/Heme/Allergies: Negative for polydipsia. Does not bruise/bleed easily.  Psychiatric/Behavioral: Negative for depression. The patient is not nervous/anxious and does not have insomnia.      VITAL SIGNS:   Vitals:   02/03/19 2030 02/03/19 2100 02/03/19 2115 02/03/19 2130  BP: 138/66 119/68  122/76  Pulse:   (!) 31 84  Resp: 18 (!) 21 20 (!) 21  Temp:      TempSrc:      SpO2:   97% 96%  Weight:      Height:       Wt Readings from Last 3 Encounters:  02/03/19 83 kg  10/19/18 86.2 kg  10/19/18 86.5 kg    PHYSICAL EXAMINATION:  Physical Exam  Vitals reviewed. Constitutional: He is oriented to person, place, and time. He appears well-developed and well-nourished. No distress.  HENT:  Head: Normocephalic and atraumatic.  Mouth/Throat: Oropharynx is clear and moist.  Eyes: Pupils are equal, round, and reactive to light. Conjunctivae and EOM are normal. No scleral icterus.  Neck: Normal range of motion. Neck supple. No JVD present. No thyromegaly present.  Cardiovascular: Normal rate and intact distal pulses. Exam reveals no  gallop and no friction rub.  No murmur heard. Irregular rhythm  Respiratory: Effort normal and breath sounds normal. No respiratory distress. He has no wheezes. He has no rales.  GI: Soft. Bowel sounds are normal. He exhibits no distension. There is no abdominal tenderness.  Musculoskeletal: Normal range of motion.        General: No edema.     Comments: No arthritis, no gout  Lymphadenopathy:    He has no cervical adenopathy.  Neurological: He is alert and oriented to person, place, and time. No cranial nerve deficit.  No dysarthria, no aphasia  Skin: Skin is warm and dry. No rash noted. No erythema.  Facial and nose abrasions  Psychiatric: He has a normal mood and affect. His behavior is normal. Judgment and thought content normal.    LABORATORY PANEL:   CBC Recent Labs  Lab 02/03/19 1928  WBC 7.9  HGB 14.5  HCT 43.3  PLT 209   ------------------------------------------------------------------------------------------------------------------  Chemistries  Recent Labs  Lab 02/03/19 1928  NA 138  K 4.0  CL 104  CO2 24  GLUCOSE 107*  BUN 19  CREATININE 1.02  CALCIUM 8.9   ------------------------------------------------------------------------------------------------------------------  Cardiac Enzymes Recent Labs  Lab 02/03/19 1928  TROPONINI <0.03   ------------------------------------------------------------------------------------------------------------------  RADIOLOGY:  Ct Head Wo Contrast  Result Date: 02/03/2019 CLINICAL DATA:  Syncope and fall today.  Blow to the forehead. EXAM: CT HEAD WITHOUT CONTRAST CT CERVICAL SPINE WITHOUT CONTRAST TECHNIQUE: Multidetector CT imaging of the head and cervical spine was performed following the standard protocol without intravenous contrast. Multiplanar CT image reconstructions of the cervical spine were also generated. COMPARISON:  None. FINDINGS: CT HEAD FINDINGS Brain: No evidence of acute infarction, hemorrhage,  hydrocephalus, extra-axial collection or mass lesion/mass effect. Vascular: No hyperdense vessel or unexpected calcification. Skull: Intact.  No focal lesion. Sinuses/Orbits: Negative. Other: Soft tissue contusion midline over the forehead noted. CT CERVICAL SPINE FINDINGS Alignment: Maintained with straightening of lordosis noted. Skull base and vertebrae: No acute fracture. No primary bone lesion or focal pathologic process. Soft tissues and spinal canal: No prevertebral fluid or swelling. No visible canal hematoma. Disc levels: Marked multilevel loss of disc space height is identified and appears worst C4-5 and C5-6. Upper chest: Clear. Other: None. IMPRESSION: Soft tissue contusion on the forehead. Negative for fracture intracranial. No acute abnormality cervical spinal spondylosis Electronically Signed   By: Inge Rise M.D.   On: 02/03/2019 20:10   Ct Cervical Spine Wo Contrast  Result Date: 02/03/2019 CLINICAL DATA:  Syncope and fall today.  Blow to the forehead. EXAM: CT HEAD WITHOUT CONTRAST CT CERVICAL SPINE WITHOUT CONTRAST TECHNIQUE: Multidetector CT imaging of the head and cervical spine was performed following the standard protocol without intravenous contrast. Multiplanar CT image reconstructions of the cervical spine were also generated. COMPARISON:  None. FINDINGS: CT HEAD FINDINGS Brain: No evidence of acute infarction, hemorrhage, hydrocephalus, extra-axial collection or mass lesion/mass effect. Vascular: No hyperdense vessel or unexpected calcification. Skull: Intact.  No focal lesion. Sinuses/Orbits: Negative. Other: Soft tissue contusion midline over the forehead noted. CT CERVICAL SPINE FINDINGS Alignment: Maintained with straightening of lordosis noted. Skull base and vertebrae: No acute fracture. No primary bone lesion or focal pathologic process. Soft tissues and spinal canal: No prevertebral fluid or swelling. No visible canal hematoma. Disc levels: Marked multilevel loss of disc  space height is identified and appears worst C4-5 and C5-6. Upper chest: Clear. Other: None. IMPRESSION: Soft tissue contusion on the forehead. Negative for fracture intracranial. No acute abnormality cervical spinal spondylosis Electronically Signed   By: Inge Rise M.D.   On: 02/03/2019 20:10    EKG:  No orders found for this or any previous visit.  IMPRESSION AND PLAN:  Principal Problem:   New onset atrial fibrillation (HCC) -rate is currently within normal limits, he remains in A. fib here though.  Will monitor his rate tonight, treat as needed if it is above 110, get an echocardiogram and a cardiology consult Active Problems:   Syncope -almost certainly due to his A. fib, work-up as above  Chart review performed and case discussed with ED provider. Labs, imaging and/or ECG reviewed by provider and discussed with patient/family. Management plans discussed with the patient and/or family.  COVID-19 status: Tested negative     DVT PROPHYLAXIS: SubQ lovenox   GI PROPHYLAXIS:  None  ADMISSION STATUS: Observation  CODE STATUS: Full Advance Directive Documentation     Most Recent Value  Type of Advance Directive  Living will  Pre-existing out of facility DNR order (yellow form or pink MOST form)  -  "MOST" Form in Place?  -      TOTAL TIME TAKING CARE OF THIS PATIENT: 40 minutes.   This patient was evaluated in the context of the global COVID-19 pandemic, which necessitated consideration that the patient might be at risk for infection with the SARS-CoV-2 virus that causes COVID-19. Institutional protocols and algorithms that pertain to the evaluation of patients at risk for COVID-19 are in a state of rapid change based on information released by regulatory bodies including the CDC and federal and state organizations. These policies and algorithms were followed to the best of this provider's knowledge to date during the patient's care at this facility.  Ethlyn Daniels 02/03/2019, 10:33 PM  [facility]  Office  [phone]  CC: Primary care physician; Jerrol Banana., MD  Note:  This document was prepared using Dragon voice recognition software and may include unintentional dictation errors.

## 2019-02-03 NOTE — ED Triage Notes (Signed)
Patient arrives via EMS after a fall from a seated position after playing tennis this evening. Patient was with a friend who says "he fell forward off the bench, was out for a couple minutes" before coming to. Patient has lacerations on forehead, is alert and oriented at present

## 2019-02-03 NOTE — ED Notes (Signed)
Wounds to the nose, forehead and chin cleaned and neosporin applied.

## 2019-02-03 NOTE — ED Notes (Signed)
Report given to RN on 2A, VSS

## 2019-02-03 NOTE — ED Provider Notes (Signed)
[facility] Emergency Department Provider Note  ____________________________________________   I have reviewed the triage vital signs and the nursing notes.   HISTORY  Chief Complaint Loss of Consciousness   History limited by: Not Limited   HPI Glenn Perez is a 73 y.o. male who presents to the emergency department today after a syncopal episode. The patient had been outside playing tennis when he started feeling dizzy and lightheaded. Did sit down for about 20 minutes and started feeling better so started playing tennis again. Shortly after playing he felt dizzy and sat down on a bench. A friend who was with him then told him that he passed out and landed on his forehead on the ground. The patient denies any preceding chest pain or palpitations. Did try to stay hydrated today and drank a 16 oz gatorade and 18 oz of water. The patient denies any recent illness.   Records reviewed. Per medical record review patient has a history of appendectomy, orthopedic complaints and surgeries.   History reviewed. No pertinent past medical history.  Patient Active Problem List   Diagnosis Date Noted  . Arthritis 07/30/2017  . Chronic rhinitis 07/30/2017    Past Surgical History:  Procedure Laterality Date  . APPENDECTOMY    . KNEE SURGERY    . NOSE SURGERY     x 2.  . REPLACEMENT TOTAL KNEE    . SHOULDER SURGERY  10/2015   for partially torn rotator cuff and detached bicep tendon.    Prior to Admission medications   Medication Sig Start Date End Date Taking? Authorizing Provider  amoxicillin-clavulanate (AUGMENTIN) 875-125 MG tablet Take 1 tablet by mouth 2 (two) times daily. Patient not taking: Reported on 02/03/2019 10/12/18   Carmon Ginsberg, PA    Allergies Patient has no known allergies.  Family History  Problem Relation Age of Onset  . Diverticulosis Mother   . Cancer Brother        unsure of what kind    Social History Social History    Tobacco Use  . Smoking status: Never Smoker  . Smokeless tobacco: Never Used  Substance Use Topics  . Alcohol use: Yes    Alcohol/week: 1.0 standard drinks    Types: 1 Standard drinks or equivalent per week  . Drug use: No    Review of Systems Constitutional: No fever/chills Eyes: No visual changes. ENT: No sore throat. Cardiovascular: Denies chest pain. Denies palpitations.  Respiratory: Denies shortness of breath. Gastrointestinal: No abdominal pain.  No nausea, no vomiting.  No diarrhea.   Genitourinary: Negative for dysuria. Musculoskeletal: Negative for back pain. Skin: Positive for abrasions to his forehead.  Neurological: Positive for headache. Positive for syncopal episodes.  ____________________________________________   PHYSICAL EXAM:  VITAL SIGNS: ED Triage Vitals  Enc Vitals Group     BP 02/03/19 1920 (!) 141/85     Pulse Rate 02/03/19 1920 93     Resp 02/03/19 1920 19     Temp 02/03/19 1920 97.8 F (36.6 C)     Temp Source 02/03/19 1920 Oral     SpO2 02/03/19 1920 97 %     Weight 02/03/19 1924 183 lb (83 kg)     Height 02/03/19 1924 6' (1.829 m)     Head Circumference --      Peak Flow --      Pain Score 02/03/19 1921 1   Constitutional: Alert and oriented.  Eyes: Conjunctivae are normal.  ENT      Head:  Normocephalic and atraumatic.      Nose: No congestion/rhinnorhea.      Mouth/Throat: Mucous membranes are moist.      Neck: No stridor. Hematological/Lymphatic/Immunilogical: No cervical lymphadenopathy. Cardiovascular: Tachycardic. Irregularly irregular rhythm.  Respiratory: Normal respiratory effort without tachypnea nor retractions. Breath sounds are clear and equal bilaterally. No wheezes/rales/rhonchi. Gastrointestinal: Soft and non tender. No rebound. No guarding.  Genitourinary: Deferred Musculoskeletal: Normal range of motion in all extremities. No lower extremity edema. Neurologic:  Normal speech and language. No gross focal neurologic  deficits are appreciated.  Skin:  Skin is warm, dry and intact. No rash noted. Psychiatric: Mood and affect are normal. Speech and behavior are normal. Patient exhibits appropriate insight and judgment.  ____________________________________________    LABS (pertinent positives/negatives)  Trop <0.03 BMP wnl except glu 107 CBC wbc 7.9, hgb 14.5, plt 209  ____________________________________________   EKG  I, Nance Pear, attending physician, personally viewed and interpreted this EKG  EKG Time: 1917 Rate: 122 Rhythm: atrial fibrillation Axis: normal Intervals: qtc 500 QRS: RSR' in V1 ST changes: no st elevation Impression: abnormal ekg  ____________________________________________    RADIOLOGY  CT head/cervical spine Soft tissue contusion, no fracture. No intracranial bleed.  ____________________________________________   PROCEDURES  Procedures  ____________________________________________   INITIAL IMPRESSION / ASSESSMENT AND PLAN / ED COURSE  Pertinent labs & imaging results that were available during my care of the patient were reviewed by me and considered in my medical decision making (see chart for details).   Patient presented to the emergency department today because of concern for a syncopal episode. Patient was found to be in atrial fibrillation with RVR upon initial presentation. Patient denies any history of afib. Blood work without obvious electrolyte abnormality. Do think patient might be suffering from some dehydration given activity outside today. Will give fluids. Will plan on admission. Discussed findings and plan with patient.    ____________________________________________   FINAL CLINICAL IMPRESSION(S) / ED DIAGNOSES  Final diagnoses:  Syncope, unspecified syncope type  Atrial fibrillation, unspecified type [facility])     Note: This dictation was prepared with Dragon dictation. Any transcriptional errors that result from this process  are unintentional     Nance Pear, MD 02/03/19 2247

## 2019-02-04 ENCOUNTER — Observation Stay
Admit: 2019-02-04 | Discharge: 2019-02-04 | Disposition: A | Discharge status: Home / Self Care | Payer: Medicare HMO | Attending: Internal Medicine | Admitting: Internal Medicine

## 2019-02-04 DIAGNOSIS — R55 Syncope and collapse: Secondary | ICD-10-CM | POA: Diagnosis not present

## 2019-02-04 DIAGNOSIS — I4891 Unspecified atrial fibrillation: Secondary | ICD-10-CM | POA: Diagnosis not present

## 2019-02-04 LAB — BASIC METABOLIC PANEL
Anion gap: 7 (ref 5–15)
BUN: 15 mg/dL (ref 8–23)
CO2: 25 mmol/L (ref 22–32)
Calcium: 8.6 mg/dL — ABNORMAL LOW (ref 8.9–10.3)
Chloride: 107 mmol/L (ref 98–111)
Creatinine, Ser: 0.87 mg/dL (ref 0.61–1.24)
GFR calc Af Amer: >60 mL/min (ref >60)
GFR calc non Af Amer: >60 mL/min (ref >60)
Glucose, Bld: 105 mg/dL — ABNORMAL HIGH (ref 70–99)
Potassium: 4 mmol/L (ref 3.5–5.1)
Sodium: 139 mmol/L (ref 135–145)

## 2019-02-04 LAB — CBC
HCT: 42.6 % (ref 39.0–52.0)
Hemoglobin: 14 g/dL (ref 13.0–17.0)
MCH: 27.5 pg (ref 26.0–34.0)
MCHC: 32.9 g/dL (ref 30.0–36.0)
MCV: 83.7 fL (ref 80.0–100.0)
Platelets: 207 10*3/uL (ref 150–400)
RBC: 5.09 MIL/uL (ref 4.22–5.81)
RDW: 13.7 % (ref 11.5–15.5)
WBC: 7 10*3/uL (ref 4.0–10.5)
nRBC: 0 % (ref 0.0–0.2)

## 2019-02-04 LAB — ECHOCARDIOGRAM COMPLETE
Height: 72 in
Weight: 2995.2 oz

## 2019-02-04 MED ORDER — ASPIRIN EC 81 MG PO TBEC: 81.0000 mg | DELAYED_RELEASE_TABLET | Freq: Every day | ORAL | 2 refills | Status: AC

## 2019-02-04 NOTE — Care Management Obs Status (Signed)
[facility] NOTIFICATION   Patient Details  Name: Glenn Perez MRN: [id number] Date of Birth: 1945-10-05   Medicare Observation Status Notification Given:  Yes    Aruna Nestler A Eugen Jeansonne, RN 02/04/2019, 11:41 AM

## 2019-02-04 NOTE — Progress Notes (Signed)
Pt to be discharged today. Iv and tele removed. disch instructions given. Pt's wife to transport.

## 2019-02-04 NOTE — Discharge Summary (Signed)
[employer] - [facility], 73 y.o., DOB 05/10/1946, MRN [id number]. Admission date: 02/03/2019 Discharge Date 02/04/2019 Primary MD Jerrol Banana., MD Admitting Physician Lance Coon, MD  Admission Diagnosis  Syncope, unspecified syncope type [R55] Atrial fibrillation, unspecified type [facility]) [I48.91]  Discharge Diagnosis   Principal Problem: Syncope New onset atrial fibrillation [facility])         Glenn Perez  is a 73 y.o. male who presents with chief complaint as above.  Patient presents the ED after syncopal episode.  He was playing basketball today and had a syncope.  He has significant abrasion on his forehead and bridge of his nose.  Here in the ED was found to be in atrial fibrillation.  Patient was monitored on telemetry.  He was seen in consultation by cardiology.  Had echocardiogram of the heart which was normal.  Cardiology recommended discharge with follow-up on Monday for further evaluation.            Consults  cardiology  Significant Tests:  See full reports for all details    Ct Head Wo Contrast  Result Date: 02/03/2019 CLINICAL DATA:  Syncope and fall today.  Blow to the forehead. EXAM: CT HEAD WITHOUT CONTRAST CT CERVICAL SPINE WITHOUT CONTRAST TECHNIQUE: Multidetector CT imaging of the head and cervical spine was performed following the standard protocol without intravenous contrast. Multiplanar CT image reconstructions of the cervical spine were also generated. COMPARISON:  None. FINDINGS: CT HEAD FINDINGS Brain: No evidence of acute infarction, hemorrhage, hydrocephalus, extra-axial collection or mass lesion/mass effect. Vascular: No hyperdense vessel or unexpected calcification. Skull: Intact.  No focal lesion. Sinuses/Orbits: Negative. Other: Soft tissue contusion midline over the forehead noted. CT CERVICAL SPINE FINDINGS Alignment: Maintained with straightening of lordosis noted.  Skull base and vertebrae: No acute fracture. No primary bone lesion or focal pathologic process. Soft tissues and spinal canal: No prevertebral fluid or swelling. No visible canal hematoma. Disc levels: Marked multilevel loss of disc space height is identified and appears worst C4-5 and C5-6. Upper chest: Clear. Other: None. IMPRESSION: Soft tissue contusion on the forehead. Negative for fracture intracranial. No acute abnormality cervical spinal spondylosis Electronically Signed   By: Inge Rise M.D.   On: 02/03/2019 20:10   Ct Cervical Spine Wo Contrast  Result Date: 02/03/2019 CLINICAL DATA:  Syncope and fall today.  Blow to the forehead. EXAM: CT HEAD WITHOUT CONTRAST CT CERVICAL SPINE WITHOUT CONTRAST TECHNIQUE: Multidetector CT imaging of the head and cervical spine was performed following the standard protocol without intravenous contrast. Multiplanar CT image reconstructions of the cervical spine were also generated. COMPARISON:  None. FINDINGS: CT HEAD FINDINGS Brain: No evidence of acute infarction, hemorrhage, hydrocephalus, extra-axial collection or mass lesion/mass effect. Vascular: No hyperdense vessel or unexpected calcification. Skull: Intact.  No focal lesion. Sinuses/Orbits: Negative. Other: Soft tissue contusion midline over the forehead noted. CT CERVICAL SPINE FINDINGS Alignment: Maintained with straightening of lordosis noted. Skull base and vertebrae: No acute fracture. No primary bone lesion or focal pathologic process. Soft tissues and spinal canal: No prevertebral fluid or swelling. No visible canal hematoma. Disc levels: Marked multilevel loss of disc space height is identified and appears worst C4-5 and C5-6. Upper chest: Clear. Other: None. IMPRESSION: Soft tissue contusion on the forehead. Negative for fracture intracranial. No acute abnormality cervical spinal spondylosis Electronically Signed   By: Inge Rise M.D.   On: 02/03/2019 20:10       Today  Subjective:    Glenn Perez patient doing well denies complaints Objective:   Blood pressure 96/77, pulse 64, temperature 98.4 F (36.9 C), temperature source Oral, resp. rate 16, height 6' (1.829 m), weight 84.9 kg, SpO2 98 %.  .  Intake/Output Summary (Last 24 hours) at 02/04/2019 1336 Last data filed at 02/04/2019 0250 Gross per 24 hour  Intake -  Output 425 ml  Net -425 ml    Exam VITAL SIGNS: Blood pressure 96/77, pulse 64, temperature 98.4 F (36.9 C), temperature source Oral, resp. rate 16, height 6' (1.829 m), weight 84.9 kg, SpO2 98 %.  GENERAL:  73 y.o.-year-old patient lying in the bed with no acute distress.  EYES: Pupils equal, round, reactive to light and accommodation. No scleral icterus. Extraocular muscles intact.  HEENT: Head atraumatic, normocephalic. Oropharynx and nasopharynx clear.  NECK:  Supple, no jugular venous distention. No thyroid enlargement, no tenderness.  LUNGS: Normal breath sounds bilaterally, no wheezing, rales,rhonchi or crepitation. No use of accessory muscles of respiration.  CARDIOVASCULAR: S1, S2 normal. No murmurs, rubs, or gallops.  ABDOMEN: Soft, nontender, nondistended. Bowel sounds present. No organomegaly or mass.  EXTREMITIES: No pedal edema, cyanosis, or clubbing.  NEUROLOGIC: Cranial nerves II through XII are intact. Muscle strength 5/5 in all extremities. Sensation intact. Gait not checked.  PSYCHIATRIC: The patient is alert and oriented x 3.  SKIN: No obvious rash, lesion, or ulcer.   Data Review     CBC w Diff:  Lab Results  Component Value Date   WBC 7.0 02/04/2019   HGB 14.0 02/04/2019   HGB 13.6 10/20/2018   HCT 42.6 02/04/2019   HCT 41.4 10/20/2018   PLT 207 02/04/2019   PLT 355 10/20/2018   LYMPHOPCT 25 02/03/2019   MONOPCT 10 02/03/2019   EOSPCT 2 02/03/2019   BASOPCT 1 02/03/2019   CMP:  Lab Results  Component Value Date   NA 139 02/04/2019   NA 141 10/20/2018   NA 137 01/03/2014   K 4.0 02/04/2019   K 3.9  01/03/2014   CL 107 02/04/2019   CL 104 01/03/2014   CO2 25 02/04/2019   CO2 30 01/03/2014   BUN 15 02/04/2019   BUN 18 10/20/2018   BUN 15 01/03/2014   CREATININE 0.87 02/04/2019   CREATININE 0.90 01/03/2014   PROT 6.2 10/20/2018   PROT 7.2 01/03/2014   ALBUMIN 4.0 10/20/2018   ALBUMIN 3.9 01/03/2014   BILITOT 0.3 10/20/2018   BILITOT 0.4 01/03/2014   ALKPHOS 146 (H) 10/20/2018   ALKPHOS 110 01/03/2014   AST 18 10/20/2018   AST 36 01/03/2014   ALT 23 10/20/2018   ALT 40 01/03/2014  .  Micro Results Recent Results (from the past 240 hour(s))  SARS Coronavirus 2 [facility] order, Performed in [facility] hospital lab)     Status: None   Collection Time: 02/03/19  8:16 PM  Result Value Ref Range Status   SARS Coronavirus 2 NEGATIVE NEGATIVE Final    Comment: (NOTE) If result is NEGATIVE SARS-CoV-2 target nucleic acids are NOT DETECTED. The SARS-CoV-2 RNA is generally detectable in upper and lower  respiratory specimens during the acute phase of infection. The lowest  concentration of SARS-CoV-2 viral copies this assay can detect is 250  copies / mL. A negative result does not preclude SARS-CoV-2 infection  and should not be used as the sole basis for treatment or other  patient management decisions.  A negative result may occur with  improper specimen collection /  handling, submission of specimen other  than nasopharyngeal swab, presence of viral mutation(s) within the  areas targeted by this assay, and inadequate number of viral copies  (<250 copies / mL). A negative result must be combined with clinical  observations, patient history, and epidemiological information. If result is POSITIVE SARS-CoV-2 target nucleic acids are DETECTED. The SARS-CoV-2 RNA is generally detectable in upper and lower  respiratory specimens dur ing the acute phase of infection.  Positive  results are indicative of active infection with SARS-CoV-2.  Clinical  correlation with patient history  and other diagnostic information is  necessary to determine patient infection status.  Positive results do  not rule out bacterial infection or co-infection with other viruses. If result is PRESUMPTIVE POSTIVE SARS-CoV-2 nucleic acids MAY BE PRESENT.   A presumptive positive result was obtained on the submitted specimen  and confirmed on repeat testing.  While 2019 novel coronavirus  (SARS-CoV-2) nucleic acids may be present in the submitted sample  additional confirmatory testing may be necessary for epidemiological  and / or clinical management purposes  to differentiate between  SARS-CoV-2 and other Sarbecovirus currently known to infect humans.  If clinically indicated additional testing with an alternate test  methodology [phone]) is advised. The SARS-CoV-2 RNA is generally  detectable in upper and lower respiratory sp ecimens during the acute  phase of infection. The expected result is Negative. Fact Sheet for Patients:  StrictlyIdeas.no Fact Sheet for Healthcare Providers: BankingDealers.co.za This test is not yet approved or cleared by the [place] FDA and has been authorized for detection and/or diagnosis of SARS-CoV-2 by FDA under an Emergency Use Authorization (EUA).  This EUA will remain in effect (meaning this test can be used) for the duration of the COVID-19 declaration under Section 564(b)(1) of the Act, 21 U.S.C. section 360bbb-3(b)(1), unless the authorization is terminated or revoked sooner. Performed at [facility], [address] [place], [place] [address]         Code Status Orders  (From admission, onward)         Start     Ordered   02/03/19 2333  Full code  Continuous     02/03/19 2332        Code Status History    This patient has a current code status but no historical code status.    Advance Directive Documentation     Most Recent Value  Type of Advance Directive  Living will   Pre-existing out of facility DNR order (yellow form or pink MOST form)  -  "MOST" Form in Place?  -          Follow-up Information    Yolonda Kida, MD Follow up on 02/06/2019.   Specialties:  Cardiology, Internal Medicine Why:  pt to call early monday morning to see dr. Christiane Ha information: [place] [address] [phone]           Discharge Medications   Allergies as of 02/04/2019   No Known Allergies     Medication List    STOP taking these medications   amoxicillin-clavulanate 875-125 MG tablet Commonly known as:  AUGMENTIN     TAKE these medications   aspirin EC 81 MG tablet Take 1 tablet (81 mg total) by mouth daily.          Total Time in preparing paper work, data evaluation and todays exam - 59 minutes  Dustin Flock M.D on 02/04/2019 at [place]  Office  [phone]

## 2019-02-04 NOTE — Progress Notes (Signed)
*  PRELIMINARY RESULTS* Echocardiogram 2D Echocardiogram has been performed.  [place] 02/04/2019, 12:33 PM

## 2019-02-08 DIAGNOSIS — M1710 Unilateral primary osteoarthritis, unspecified knee: Secondary | ICD-10-CM | POA: Diagnosis not present

## 2019-02-08 DIAGNOSIS — I4891 Unspecified atrial fibrillation: Secondary | ICD-10-CM | POA: Diagnosis not present

## 2019-02-08 DIAGNOSIS — R55 Syncope and collapse: Secondary | ICD-10-CM | POA: Diagnosis not present

## 2019-02-08 DIAGNOSIS — J329 Chronic sinusitis, unspecified: Secondary | ICD-10-CM | POA: Diagnosis not present

## 2019-02-27 DIAGNOSIS — R55 Syncope and collapse: Secondary | ICD-10-CM | POA: Diagnosis not present

## 2019-02-27 DIAGNOSIS — M1710 Unilateral primary osteoarthritis, unspecified knee: Secondary | ICD-10-CM | POA: Diagnosis not present

## 2019-02-27 DIAGNOSIS — I4891 Unspecified atrial fibrillation: Secondary | ICD-10-CM | POA: Diagnosis not present

## 2019-02-27 DIAGNOSIS — J329 Chronic sinusitis, unspecified: Secondary | ICD-10-CM | POA: Diagnosis not present

## 2019-03-14 DIAGNOSIS — Z9841 Cataract extraction status, right eye: Secondary | ICD-10-CM | POA: Diagnosis not present

## 2019-03-14 DIAGNOSIS — H52223 Regular astigmatism, bilateral: Secondary | ICD-10-CM | POA: Diagnosis not present

## 2019-03-14 DIAGNOSIS — H2512 Age-related nuclear cataract, left eye: Secondary | ICD-10-CM | POA: Diagnosis not present

## 2019-03-14 DIAGNOSIS — H524 Presbyopia: Secondary | ICD-10-CM | POA: Diagnosis not present

## 2019-03-14 DIAGNOSIS — H5203 Hypermetropia, bilateral: Secondary | ICD-10-CM | POA: Diagnosis not present

## 2019-03-31 DIAGNOSIS — M1711 Unilateral primary osteoarthritis, right knee: Secondary | ICD-10-CM | POA: Diagnosis not present

## 2019-03-31 DIAGNOSIS — Z96652 Presence of left artificial knee joint: Secondary | ICD-10-CM | POA: Insufficient documentation

## 2019-03-31 DIAGNOSIS — M25561 Pain in right knee: Secondary | ICD-10-CM | POA: Diagnosis not present

## 2019-04-07 DIAGNOSIS — R55 Syncope and collapse: Secondary | ICD-10-CM | POA: Diagnosis not present

## 2019-04-07 DIAGNOSIS — I4891 Unspecified atrial fibrillation: Secondary | ICD-10-CM | POA: Diagnosis not present

## 2019-04-07 DIAGNOSIS — Z0100 Encounter for examination of eyes and vision without abnormal findings: Secondary | ICD-10-CM | POA: Diagnosis not present

## 2019-04-17 ENCOUNTER — Telehealth: Payer: Self-pay | Admitting: Family Medicine

## 2019-04-17 DIAGNOSIS — Z01818 Encounter for other preprocedural examination: Secondary | ICD-10-CM | POA: Diagnosis not present

## 2019-04-17 DIAGNOSIS — M25561 Pain in right knee: Secondary | ICD-10-CM | POA: Diagnosis not present

## 2019-04-17 NOTE — Telephone Encounter (Signed)
Pt called stated that he is going to have knee replacement surgery and needs our office to let the surgeon's office know pt is cleared for surgery. Pt was advised that we would need a form from their office for surgical clearance. Pt also stated that they need [place] to order lab before his surgery. Pt stated that he doesn't know what labs they are wanting. Pt seem to get frustrated and stated he would call their office about the form and hung up. Please advise. Thanks TNP

## 2019-04-17 NOTE — Telephone Encounter (Signed)
Called patient in regards to his message. Patient was scheduled an appointment for this week. Called Dr. Peri Maris office in [place] and requested the surgical clearance form be faxed before patient's appointment. [phone]. [facility] [place]

## 2019-04-19 ENCOUNTER — Ambulatory Visit (INDEPENDENT_AMBULATORY_CARE_PROVIDER_SITE_OTHER): Payer: Medicare HMO | Admitting: Family Medicine

## 2019-04-19 ENCOUNTER — Other Ambulatory Visit: Payer: Self-pay

## 2019-04-19 ENCOUNTER — Encounter: Payer: Self-pay | Admitting: Family Medicine

## 2019-04-19 VITALS — BP 120/68 | HR 80 | Temp 98.2°F | Resp 18 | Wt 189.0 lb

## 2019-04-19 DIAGNOSIS — M1990 Unspecified osteoarthritis, unspecified site: Secondary | ICD-10-CM

## 2019-04-19 DIAGNOSIS — Z01818 Encounter for other preprocedural examination: Secondary | ICD-10-CM | POA: Diagnosis not present

## 2019-04-19 DIAGNOSIS — I4891 Unspecified atrial fibrillation: Secondary | ICD-10-CM

## 2019-04-19 DIAGNOSIS — R55 Syncope and collapse: Secondary | ICD-10-CM

## 2019-04-19 NOTE — Progress Notes (Signed)
Patient: Glenn Perez Male    DOB: 24-Mar-1946   73 y.o.   MRN: [id number] Visit Date: 04/19/2019  Today's Provider: Wilhemena Durie, MD   Chief Complaint  Patient presents with  . Medical Clearance   Subjective:     HPI   Surgical clearance for right knee replacement on May 16, 2019. No recurrence of syncope with negative workup or Afib--released by cardiology. No symptoms since then.  No CP /SOB/Neuro symptoms. No Known Allergies   Current Outpatient Medications:  .  aspirin EC 81 MG tablet, Take 1 tablet (81 mg total) by mouth daily. (Patient not taking: Reported on 04/19/2019), Disp: 150 tablet, Rfl: 2  Review of Systems  Constitutional: Negative.   Respiratory: Negative.   Cardiovascular: Negative.   Endocrine: Negative.   Musculoskeletal: Positive for joint swelling.  Allergic/Immunologic: Negative.   Neurological: Negative.   Hematological: Negative.   Psychiatric/Behavioral: Negative.     Social History   Tobacco Use  . Smoking status: Never Smoker  . Smokeless tobacco: Never Used  Substance Use Topics  . Alcohol use: Yes    Alcohol/week: 1.0 standard drinks    Types: 1 Standard drinks or equivalent per week      Objective:   BP 120/68 (BP Location: Left Arm, Patient Position: Sitting, Cuff Size: Normal)   Pulse 80   Temp 98.2 F (36.8 C) (Oral)   Resp 18   Wt 189 lb (85.7 kg)   SpO2 99%   BMI 25.63 kg/m  Vitals:   04/19/19 1349  BP: 120/68  Pulse: 80  Resp: 18  Temp: 98.2 F (36.8 C)  TempSrc: Oral  SpO2: 99%  Weight: 189 lb (85.7 kg)     Physical Exam Vitals signs reviewed.  Constitutional:      Appearance: Normal appearance. He is normal weight.  HENT:     Head: Normocephalic and atraumatic.     Right Ear: Tympanic membrane, ear canal and external ear normal.     Left Ear: Tympanic membrane, ear canal and external ear normal.     Nose: Nose normal.     Mouth/Throat:     Mouth: Mucous membranes are moist.    Pharynx: Oropharynx is clear.  Eyes:     Extraocular Movements: Extraocular movements intact.     Conjunctiva/sclera: Conjunctivae normal.     Pupils: Pupils are equal, round, and reactive to light.  Neck:     Musculoskeletal: Normal range of motion and neck supple.  Cardiovascular:     Rate and Rhythm: Normal rate and regular rhythm.     Pulses: Normal pulses.     Heart sounds: Normal heart sounds.  Pulmonary:     Effort: Pulmonary effort is normal.     Breath sounds: Normal breath sounds.  Abdominal:     General: Abdomen is flat. Bowel sounds are normal.     Palpations: Abdomen is soft.  Musculoskeletal: Normal range of motion.  Skin:    General: Skin is warm and dry.  Neurological:     General: No focal deficit present.     Mental Status: He is alert and oriented to person, place, and time. Mental status is at baseline.  Psychiatric:        Mood and Affect: Mood normal.        Behavior: Behavior normal.        Thought Content: Thought content normal.        Judgment: Judgment normal.  ECG --Sinus brady at 58 bpm  No results found for any visits on 04/19/19.     Assessment & Plan    1. Pre-operative clearance Pt cleared for TKR.Labs obtained. - EKG 12-Lead - CBC w/Diff/Platelet - Comp. Metabolic Panel (12) - HgB A1c - INR/PT  2. Arthritis   3. Syncope, unspecified syncope type Occurred after tennis on very hot day. W/u negative other than short Afib which resolved spontaneously More than 50% 25 minute visit spent in counseling or coordination of care  4. New onset atrial fibrillation (HCC) Resolved.     Richard Cranford Mon, MD  [facility]

## 2019-04-20 LAB — COMP. METABOLIC PANEL (12)
AST: 34 IU/L (ref 0–40)
Albumin/Globulin Ratio: 2.2 (ref 1.2–2.2)
Albumin: 4.4 g/dL (ref 3.7–4.7)
Alkaline Phosphatase: 119 IU/L — ABNORMAL HIGH (ref 39–117)
BUN/Creatinine Ratio: 16 (ref 10–24)
BUN: 13 mg/dL (ref 8–27)
Bilirubin Total: 0.5 mg/dL (ref 0.0–1.2)
Calcium: 9.4 mg/dL (ref 8.6–10.2)
Chloride: 100 mmol/L (ref 96–106)
Creatinine, Ser: 0.82 mg/dL (ref 0.76–1.27)
GFR calc Af Amer: 103 mL/min/{1.73_m2} (ref >59)
GFR calc non Af Amer: 89 mL/min/{1.73_m2} (ref >59)
Globulin, Total: 2 g/dL (ref 1.5–4.5)
Glucose: 78 mg/dL (ref 65–99)
Potassium: 4.3 mmol/L (ref 3.5–5.2)
Sodium: 139 mmol/L (ref 134–144)
Total Protein: 6.4 g/dL (ref 6.0–8.5)

## 2019-04-20 LAB — CBC WITH DIFFERENTIAL/PLATELET
Basophils Absolute: 0.1 10*3/uL (ref 0.0–0.2)
Basos: 1 %
EOS (ABSOLUTE): 0.2 10*3/uL (ref 0.0–0.4)
Eos: 2 %
Hematocrit: 43.9 % (ref 37.5–51.0)
Hemoglobin: 14.4 g/dL (ref 13.0–17.7)
Immature Grans (Abs): 0 10*3/uL (ref 0.0–0.1)
Immature Granulocytes: 0 %
Lymphocytes Absolute: 1.8 10*3/uL (ref 0.7–3.1)
Lymphs: 21 %
MCH: 27.4 pg (ref 26.6–33.0)
MCHC: 32.8 g/dL (ref 31.5–35.7)
MCV: 84 fL (ref 79–97)
Monocytes Absolute: 0.8 10*3/uL (ref 0.1–0.9)
Monocytes: 9 %
Neutrophils Absolute: 5.9 10*3/uL (ref 1.4–7.0)
Neutrophils: 67 %
Platelets: 220 10*3/uL (ref 150–450)
RBC: 5.26 x10E6/uL (ref 4.14–5.80)
RDW: 13.8 % (ref 11.6–15.4)
WBC: 8.7 10*3/uL (ref 3.4–10.8)

## 2019-04-20 LAB — HEMOGLOBIN A1C
Est. average glucose Bld gHb Est-mCnc: 111 mg/dL
Hgb A1c MFr Bld: 5.5 % (ref 4.8–5.6)

## 2019-04-20 LAB — PROTIME-INR
INR: 1 (ref 0.8–1.2)
Prothrombin Time: 10.7 s (ref 9.1–12.0)

## 2019-04-21 ENCOUNTER — Other Ambulatory Visit: Payer: Self-pay

## 2019-04-30 DIAGNOSIS — M10071 Idiopathic gout, right ankle and foot: Secondary | ICD-10-CM | POA: Diagnosis not present

## 2019-05-16 DIAGNOSIS — G8918 Other acute postprocedural pain: Secondary | ICD-10-CM | POA: Diagnosis not present

## 2019-05-16 DIAGNOSIS — M1711 Unilateral primary osteoarthritis, right knee: Secondary | ICD-10-CM | POA: Diagnosis not present

## 2019-05-18 ENCOUNTER — Ambulatory Visit: Payer: Medicare HMO | Attending: Orthopedic Surgery

## 2019-05-18 ENCOUNTER — Other Ambulatory Visit: Payer: Self-pay

## 2019-05-18 DIAGNOSIS — M25561 Pain in right knee: Secondary | ICD-10-CM | POA: Insufficient documentation

## 2019-05-18 DIAGNOSIS — M25661 Stiffness of right knee, not elsewhere classified: Secondary | ICD-10-CM | POA: Diagnosis not present

## 2019-05-18 NOTE — Therapy (Signed)
[facility] [address] [place], [address] Phone: [phone]   Fax:  [phone]  Physical Therapy Evaluation  Patient Details  Name: Glenn Perez MRN: [id number] Date of Birth: March 20, 1946 Referring Provider (PT): Aluisio MD   Encounter Date: 05/18/2019  PT End of Session - 05/18/19 1545    Visit Number  1    Number of Visits  13    Date for PT Re-Evaluation  06/29/19    PT Start Time  0915    PT Stop Time  1015    PT Time Calculation (min)  60 min    Activity Tolerance  Patient tolerated treatment well    Behavior During Therapy  [facility] for tasks assessed/performed       Past Medical History:  Diagnosis Date  . Syncope     Past Surgical History:  Procedure Laterality Date  . APPENDECTOMY    . KNEE SURGERY    . NOSE SURGERY     x 2.  . REPLACEMENT TOTAL KNEE    . SHOULDER SURGERY  10/2015   for partially torn rotator cuff and detached bicep tendon.    There were no vitals filed for this visit.   Subjective Assessment - 05/18/19 1450    Subjective  Patient s/p R TKA on 05/16/2019 secondary increased instability months prior. Patient states increased difficulty with weight bearing activities such as ambulating, squatting, standing and performing knee flexion/extension at end ranges. Patient reports his knee pain has been well managed and has minimal pain at baseline. Patient states he has been having difficulty with performing stairs at home as to get into his house, he has to ascend the stairs without a use a of rail and uses his wife's hand to perform. Patient states difficulty with walking using a  FWW to perform. Patient states he had increased drainage from his knee which he had to change the gauze covering the incision. Patient would like to review how to change his bandages. Patient reports he would like to return to tennis and performing standing exercises.    Pertinent History  L TKA,  previous shoulder surgery, A-Fib, syncope episdoes    Limitations  Lifting    Diagnostic tests  X-Ray    Patient Stated Goals  Playing tennis    Currently in Pain?  Yes    Pain Score  3     Pain Location  Knee    Pain Orientation  Right    Pain Descriptors / Indicators  Aching    Pain Type  Chronic pain    Pain Onset  More than a month ago    Pain Frequency  Intermittent         [facility] PT Assessment - 05/18/19 1246      Assessment   Medical Diagnosis  R TKA    Referring Provider (PT)  Aluisio MD    Onset Date/Surgical Date  05/16/19    Hand Dominance  Right    Next MD Visit  06/09/2019    Prior Therapy  yes      Balance Screen   Has the patient fallen in the past 6 months  No    Has the patient had a decrease in activity level because of a fear of falling?   Yes    Is the patient reluctant to leave their home because of a fear of falling?   No      Home Environment   Living  Environment  Private residence    Living Arrangements  Spouse/significant other;Children    Available Help at Discharge  Family    Type of [place] to enter    Entrance Stairs-Number of Steps  5    Entrance Stairs-Rails  Cannot reach both    [place]  Two level    Alternate Level Stairs-Number of Steps  [place] - 2 wheels      Prior Function   Level of Independence  Independent    Vocation  Retired    [employer]  N/A    Leisure  Tennis       Cognition   Overall Cognitive Status  Within Functional Limits for tasks assessed      Observation/Other Assessments   Observations  decreased weight bearing on the affected side    Other Surveys   Lower Extremity Functional Scale    Lower Extremity Functional Scale   24/80      Sensation   Light Touch  Appears Intact      Functional Tests   Functional tests  Squat      Squat   Comments  Decreased AROM, slow performance, initial decreased hip  hinging      Posture/Postural Control   Posture Comments  Decreased weight bearing on R side in standing      ROM / Strength   AROM / PROM / Strength  AROM;Strength      AROM   AROM Assessment Site  Knee;Hip    Right/Left Shoulder  Right;Left    Right/Left Hip  Right;Left    Right Hip Flexion  100    Right Hip External Rotation   30    Right Hip Internal Rotation   30    Right Hip ABduction  40    Right Hip ADduction  20    Left Hip Flexion  110    Left Hip External Rotation   40    Left Hip Internal Rotation   40    Left Hip ABduction  40    Left Hip ADduction  30    Right/Left Knee  Right;Left    Right Knee Extension  -15   pain   Right Knee Flexion  90   pain   Left Knee Extension  0    Left Knee Flexion  120      Strength   Strength Assessment Site  Hip;Knee    Right/Left Hip  Right;Left    Right Hip Flexion  4/5    Right Hip External Rotation   4-/5    Right Hip Internal Rotation  4/5    Right Hip ABduction  4/5    Right Hip ADduction  4+/5    Left Hip Flexion  5/5    Left Hip External Rotation  4+/5    Left Hip Internal Rotation  4+/5    Left Hip ABduction  5/5    Left Hip ADduction  5/5    Right/Left Knee  Right;Left    Right Knee Flexion  4-/5    Right Knee Extension  3+/5    Left Knee Flexion  5/5    Left Knee Extension  5/5      Palpation   Palpation comment  TTP: along quad and hamstring along affected side      Ambulation/Gait   Assistive device  Rolling walker  Gait Comments  Decreased weight bearing on the R knee, decreased hip ext on R, Decreased stride length on the R.       TREATMENT: Therapeutic Exercise Quad set -- 2 x 15 3 sec holds Stairs -- Reviewed technique with walker and step-to gait pattern ascending/desending pattern  Ball roll outs in sitting -- 2 x 20  Squats in standing -- x 10 within pain free AROM Ambulation with SPC -- x 164ft  Objective measurements completed on examination: See above findings.               PT Education - 05/18/19 1543    Education provided  Yes    Education Details  form/technique with exercise; POC; Knee flexion/extension in sitting; squatting; amb with heel strike, quad set    Person(s) Educated  Patient    Methods  Explanation;Demonstration    Comprehension  Verbalized understanding;Returned demonstration          PT Long Term Goals - 05/18/19 1558      PT LONG TERM GOAL #1   Title  Patient will be independent with HEP focused on improving elbow strengthening and coordination.     Baseline  dependent with HEP    Time  6    Period  Weeks    Status  New    Target Date  05/18/19      PT LONG TERM GOAL #2   Title  Patient will improve his LEFS to over 64/80 to indicate significant improvement in LE function and better ability to walk.    Baseline  LEFS: 27/80    Time  6    Period  Weeks    Status  New    Target Date  05/18/19      PT LONG TERM GOAL #3   Title  Patient will have a worst pain of 2/10 to indicatine significant improvement in pain and spasms within the elbow.    Baseline  6/10 worst pain    Time  6    Period  Weeks    Status  New      PT LONG TERM GOAL #4   Title  Patient will be able to ambulate over one mile to return to recreational activity of hiking.    Baseline  unable to amb over 164ft    Time  6    Period  Weeks    Status  New             Plan - 05/18/19 1546    Clinical Impression Statement  Patient is a 73 yo right hand dominant male presenting with increased difficulty walking and pain s/p R TKA on 05/16/2019. Patient demonstrates R knee dysfunction as indicated by increased pain and spasms with ambulating, low LEFS score, decreased MMT and knee AROM. Patient also demonstrates poor motor control with quad activation and will benefit from further skilled therapy to return to prior level of function.    Personal Factors and Comorbidities  Age;Comorbidity 2    Comorbidities  a fib, Previous TKA,  drainage with TKA    Examination-Activity Limitations  Bend;Lift;Caring for Others;Bathing;Squat;Stairs;Stand;Toileting;Transfers    Examination-Participation Restrictions  Church;Laundry;Other;Community Activity    Stability/Clinical Decision Making  Evolving/Moderate complexity    Rehab Potential  Good    Clinical Impairments Affecting Rehab Potential  (+) highly active, highly motivated (-) age    PT Frequency  2x / week    PT Duration  6 weeks    PT Treatment/Interventions  Iontophoresis  4mg /ml Dexamethasone;Moist Heat;Electrical Stimulation;Cryotherapy;Ultrasound;Neuromuscular re-education;Patient/family education;Passive range of motion;Dry needling    PT Next Visit Plan  Progress strengthening and AROM    PT Home Exercise Plan  See education section    Consulted and Agree with Plan of Care  Patient       Patient will benefit from skilled therapeutic intervention in order to improve the following deficits and impairments:  Pain, Increased muscle spasms, Increased fascial restricitons, Abnormal gait, Decreased balance, Decreased cognition, Decreased range of motion, Decreased coordination, Decreased strength, Decreased endurance, Decreased mobility, Decreased activity tolerance, Impaired sensation, Difficulty walking, Postural dysfunction  Visit Diagnosis: Acute pain of right knee  Stiffness of right knee, not elsewhere classified     Problem List Patient Active Problem List   Diagnosis Date Noted  . Syncope 02/03/2019  . New onset atrial fibrillation ([place]) 02/03/2019  . Arthritis 07/30/2017  . Chronic rhinitis 07/30/2017    Blythe Stanford, PT DPT 05/18/2019, 4:49 PM  [facility] [address], [place], [address] Phone: [phone]   Fax:  [phone]  Name: Glenn Perez MRN: [id number] Date of Birth: 02/24/1946

## 2019-05-22 ENCOUNTER — Ambulatory Visit: Payer: Medicare HMO

## 2019-05-22 ENCOUNTER — Other Ambulatory Visit
Admission: RE | Admit: 2019-05-22 | Discharge: 2019-05-22 | Disposition: A | Discharge status: Home / Self Care | Payer: Medicare HMO | Source: Ambulatory Visit | Attending: Internal Medicine | Admitting: Internal Medicine

## 2019-05-22 DIAGNOSIS — I4891 Unspecified atrial fibrillation: Secondary | ICD-10-CM | POA: Diagnosis not present

## 2019-05-22 DIAGNOSIS — M1710 Unilateral primary osteoarthritis, unspecified knee: Secondary | ICD-10-CM | POA: Diagnosis not present

## 2019-05-22 DIAGNOSIS — J329 Chronic sinusitis, unspecified: Secondary | ICD-10-CM | POA: Diagnosis not present

## 2019-05-22 DIAGNOSIS — R55 Syncope and collapse: Secondary | ICD-10-CM | POA: Insufficient documentation

## 2019-05-22 DIAGNOSIS — Z9889 Other specified postprocedural states: Secondary | ICD-10-CM | POA: Diagnosis not present

## 2019-05-22 DIAGNOSIS — R531 Weakness: Secondary | ICD-10-CM | POA: Diagnosis not present

## 2019-05-22 LAB — FIBRIN DERIVATIVES D-DIMER (ARMC ONLY): Fibrin derivatives D-dimer (ARMC): 4098.33 ng/mL (FEU) — ABNORMAL HIGH (ref 0.00–499.00)

## 2019-05-25 ENCOUNTER — Ambulatory Visit: Payer: Medicare HMO | Attending: Orthopedic Surgery

## 2019-05-25 ENCOUNTER — Other Ambulatory Visit: Payer: Self-pay

## 2019-05-25 DIAGNOSIS — M25661 Stiffness of right knee, not elsewhere classified: Secondary | ICD-10-CM | POA: Insufficient documentation

## 2019-05-25 DIAGNOSIS — M25561 Pain in right knee: Secondary | ICD-10-CM | POA: Insufficient documentation

## 2019-05-25 NOTE — Therapy (Signed)
[place] [facility] [address] [place], [address] Phone: [phone]   Fax:  [phone]  Physical Therapy Treatment  Patient Details  Name: Glenn Perez MRN: [id number] Date of Birth: [birth year] Referring Provider (PT): Aluisio MD   Encounter Date: 05/25/2019  PT End of Session - 05/25/19 1222    Visit Number  2    Number of Visits  13    Date for PT Re-Evaluation  06/29/19    PT Start Time  1115    PT Stop Time  1200    PT Time Calculation (min)  45 min    Activity Tolerance  Patient tolerated treatment well    Behavior During Therapy  [facility] for tasks assessed/performed       Past Medical History:  Diagnosis Date  . Syncope     Past Surgical History:  Procedure Laterality Date  . APPENDECTOMY    . KNEE SURGERY    . NOSE SURGERY     x 2.  . REPLACEMENT TOTAL KNEE    . SHOULDER SURGERY  10/2015   for partially torn rotator cuff and detached bicep tendon.    There were no vitals filed for this visit.  Subjective Assessment - 05/25/19 1217    Subjective  Patient states increased pain with performing exercises at end range. Patient states he had a episode of syncope and is now wearing a holter monitor to track progress.    Pertinent History  L TKA, previous shoulder surgery, A-Fib, syncope episdoes    Limitations  Lifting    Diagnostic tests  X-Ray    Patient Stated Goals  Playing tennis    Currently in Pain?  Yes    Pain Score  4     Pain Location  Knee    Pain Orientation  Right    Pain Descriptors / Indicators  Aching    Pain Type  Acute pain    Pain Onset  More than a month ago    Pain Frequency  Intermittent       TREATMENT Therapeutic Exercise: Ball roll outs in sitting - x 10  Squats in standing with UE support - x 20 Quad sets in long sitting with focus on quad activation - x 20 10sec Knee flexion in sitting CKC with moving - x 20  Squats in standing with UE support - x20   Step ups onto 6" with pressing into knee flexion - x 20  Step ups onto 4" with unilateral support - x 20 Manual therapy STM performed to quadriceps to decrease pain and spasms along the knee to improve pain and spasms along the affected LE Performed therapeutic exercise to improve knee flexion/ext in standing and in sitting  PT Education - 05/25/19 1222    Education provided  Yes    Education Details  form/technique with exercie; squatting, heel raises/ toe raises    Person(s) Educated  Patient    Methods  Explanation;Demonstration    Comprehension  Verbalized understanding;Returned demonstration          PT Long Term Goals - 05/18/19 1558      PT LONG TERM GOAL #1   Title  Patient will be independent with HEP focused on improving elbow strengthening and coordination.     Baseline  dependent with HEP    Time  6    Period  Weeks    Status  New    Target Date  05/18/19  PT LONG TERM GOAL #2   Title  Patient will improve his LEFS to over 64/80 to indicate significant improvement in LE function and better ability to walk.    Baseline  LEFS: 27/80    Time  6    Period  Weeks    Status  New    Target Date  05/18/19      PT LONG TERM GOAL #3   Title  Patient will have a worst pain of 2/10 to indicatine significant improvement in pain and spasms within the elbow.    Baseline  6/10 worst pain    Time  6    Period  Weeks    Status  New      PT LONG TERM GOAL #4   Title  Patient will be able to ambulate over one mile to return to recreational activity of hiking.    Baseline  unable to amb over 172ft    Time  6    Period  Weeks    Status  New            Plan - 05/25/19 1223    Clinical Impression Statement  Patient demonstrates improvement with knee flexion but continues to demonstrate increased limitations with ambulating, weight bearing onto the affected side, and ascending/desending stairs. Patient demonstrates improvement overall with greater knee flexion  compared to previous sessions, but continues to have significant limitations.    Personal Factors and Comorbidities  Age;Comorbidity 2    Comorbidities  a fib, Previous TKA, drainage with TKA    Examination-Activity Limitations  Bend;Lift;Caring for Others;Bathing;Squat;Stairs;Stand;Toileting;Transfers    Examination-Participation Restrictions  Church;Laundry;Other;Community Activity    Stability/Clinical Decision Making  Evolving/Moderate complexity    Rehab Potential  Good    Clinical Impairments Affecting Rehab Potential  (+) highly active, highly motivated (-) age    PT Frequency  2x / week    PT Duration  6 weeks    PT Treatment/Interventions  Iontophoresis 4mg /ml Dexamethasone;Moist Heat;Electrical Stimulation;Cryotherapy;Ultrasound;Neuromuscular re-education;Patient/family education;Passive range of motion;Dry needling    PT Next Visit Plan  Progress strengthening and AROM    PT Home Exercise Plan  See education section    Consulted and Agree with Plan of Care  Patient       Patient will benefit from skilled therapeutic intervention in order to improve the following deficits and impairments:  Pain, Increased muscle spasms, Increased fascial restricitons, Abnormal gait, Decreased balance, Decreased cognition, Decreased range of motion, Decreased coordination, Decreased strength, Decreased endurance, Decreased mobility, Decreased activity tolerance, Impaired sensation, Difficulty walking, Postural dysfunction  Visit Diagnosis: Acute pain of right knee  Stiffness of right knee, not elsewhere classified     Problem List Patient Active Problem List   Diagnosis Date Noted  . Syncope 02/03/2019  . New onset atrial fibrillation ([place]) 02/03/2019  . Arthritis 07/30/2017  . Chronic rhinitis 07/30/2017    Blythe Stanford, PT DPT 05/25/2019, 12:26 PM  [place] [address] [place], [address] Phone:  [phone]   Fax:  [phone]  Name: Glenn Perez MRN: [id number] Date of Birth: [birth year]

## 2019-05-30 ENCOUNTER — Ambulatory Visit: Payer: Medicare HMO

## 2019-05-30 ENCOUNTER — Other Ambulatory Visit: Payer: Self-pay

## 2019-05-30 DIAGNOSIS — M25561 Pain in right knee: Secondary | ICD-10-CM | POA: Diagnosis not present

## 2019-05-30 DIAGNOSIS — M25661 Stiffness of right knee, not elsewhere classified: Secondary | ICD-10-CM

## 2019-05-31 NOTE — Therapy (Signed)
[place] [facility] [address], [place], [address] Phone: [phone]   Fax:  [phone]  Physical Therapy Treatment  Patient Details  Name: Glenn Perez MRN: [id number] Date of Birth: 1946-04-17 Referring Provider (PT): Aluisio MD   Encounter Date: 05/30/2019  PT End of Session - 05/31/19 1257    Visit Number  3    Number of Visits  13    Date for PT Re-Evaluation  06/29/19    PT Start Time  1115    PT Stop Time  1200    PT Time Calculation (min)  45 min    Activity Tolerance  Patient tolerated treatment well    Behavior During Therapy  [facility] for tasks assessed/performed       Past Medical History:  Diagnosis Date  . Syncope     Past Surgical History:  Procedure Laterality Date  . APPENDECTOMY    . KNEE SURGERY    . NOSE SURGERY     x 2.  . REPLACEMENT TOTAL KNEE    . SHOULDER SURGERY  10/2015   for partially torn rotator cuff and detached bicep tendon.    There were no vitals filed for this visit.  Subjective Assessment - 05/30/19 1858    Subjective  Patient reports he has been performing his exercises at home. Patient reports he performed heel/toe rasies.    Pertinent History  L TKA, previous shoulder surgery, A-Fib, syncope episdoes    Limitations  Lifting    Diagnostic tests  X-Ray    Patient Stated Goals  Playing tennis    Currently in Pain?  Yes    Pain Score  3     Pain Location  Knee    Pain Orientation  Right    Pain Descriptors / Indicators  Aching    Pain Type  Acute pain    Pain Onset  More than a month ago    Pain Frequency  Intermittent          TREATMENT  Therapeutic Exercise:  Ball roll outs in sitting - x 20 TKE in standing - x 10 5 sec with dorsiflexion  Quad sets in long sitting with focus on quad activation - x 20 10sec  Squats in standing with UE support - x20 with weight shifts onto the affected side Step ups onto 6" with pressing into knee flexion - x  10 Step ups onto 4" with unilateral support - x 10 Ambulation with focus on improving push off on the affected side - 2 x 170ft  Ambulation with SPC and without UE support - 2 x 15ft Manual therapy  STM performed to quadriceps to decrease pain and spasms along the knee to improve pain and spasms along the affected LE  Performed therapeutic exercise to improve knee flexion/ext in standing and in sitting   PT Education - 05/31/19 1257    Education provided  Yes    Education Details  form/technique with exercise    Person(s) Educated  Patient    Methods  Explanation;Demonstration    Comprehension  Verbalized understanding;Returned demonstration          PT Long Term Goals - 05/18/19 1558      PT LONG TERM GOAL #1   Title  Patient will be independent with HEP focused on improving elbow strengthening and coordination.     Baseline  dependent with HEP    Time  6    Period  Weeks  Status  New    Target Date  05/18/19      PT LONG TERM GOAL #2   Title  Patient will improve his LEFS to over 64/80 to indicate significant improvement in LE function and better ability to walk.    Baseline  LEFS: 27/80    Time  6    Period  Weeks    Status  New    Target Date  05/18/19      PT LONG TERM GOAL #3   Title  Patient will have a worst pain of 2/10 to indicatine significant improvement in pain and spasms within the elbow.    Baseline  6/10 worst pain    Time  6    Period  Weeks    Status  New      PT LONG TERM GOAL #4   Title  Patient will be able to ambulate over one mile to return to recreational activity of hiking.    Baseline  unable to amb over 131ft    Time  6    Period  Weeks    Status  New            Plan - 05/31/19 1258    Clinical Impression Statement  Patient demonstrates improvement with squatting motions today with ability to perform throughout a greater range of motion compared to previous sessions. Patient also was able to ambulate for longer periods of time  without AD, however, he was unable to sustain challenge with higher level balance. Patient will benefit from further skilled therapy to return to prior level of function.    Personal Factors and Comorbidities  Age;Comorbidity 2    Comorbidities  a fib, Previous TKA, drainage with TKA    Examination-Activity Limitations  Bend;Lift;Caring for Others;Bathing;Squat;Stairs;Stand;Toileting;Transfers    Examination-Participation Restrictions  Church;Laundry;Other;Community Activity    Stability/Clinical Decision Making  Evolving/Moderate complexity    Rehab Potential  Good    Clinical Impairments Affecting Rehab Potential  (+) highly active, highly motivated (-) age    PT Frequency  2x / week    PT Duration  6 weeks    PT Treatment/Interventions  Iontophoresis 4mg /ml Dexamethasone;Moist Heat;Electrical Stimulation;Cryotherapy;Ultrasound;Neuromuscular re-education;Patient/family education;Passive range of motion;Dry needling    PT Next Visit Plan  Progress strengthening and AROM    PT Home Exercise Plan  See education section    Consulted and Agree with Plan of Care  Patient       Patient will benefit from skilled therapeutic intervention in order to improve the following deficits and impairments:  Pain, Increased muscle spasms, Increased fascial restricitons, Abnormal gait, Decreased balance, Decreased cognition, Decreased range of motion, Decreased coordination, Decreased strength, Decreased endurance, Decreased mobility, Decreased activity tolerance, Impaired sensation, Difficulty walking, Postural dysfunction  Visit Diagnosis: Acute pain of right knee  Stiffness of right knee, not elsewhere classified     Problem List Patient Active Problem List   Diagnosis Date Noted  . Syncope 02/03/2019  . New onset atrial fibrillation ([place]) 02/03/2019  . Arthritis 07/30/2017  . Chronic rhinitis 07/30/2017    Blythe Stanford, PT DPT 05/31/2019, 1:01 PM  [place]  [facility] [address] [place], [address] Phone: [phone]   Fax:  [phone]  Name: Glenn Perez MRN: [id number] Date of Birth: Aug 25, 1946

## 2019-06-01 ENCOUNTER — Ambulatory Visit: Payer: Medicare HMO

## 2019-06-01 ENCOUNTER — Other Ambulatory Visit: Payer: Self-pay

## 2019-06-01 DIAGNOSIS — M25561 Pain in right knee: Secondary | ICD-10-CM | POA: Diagnosis not present

## 2019-06-01 DIAGNOSIS — Z96651 Presence of right artificial knee joint: Secondary | ICD-10-CM | POA: Diagnosis not present

## 2019-06-01 DIAGNOSIS — M25661 Stiffness of right knee, not elsewhere classified: Secondary | ICD-10-CM | POA: Diagnosis not present

## 2019-06-01 NOTE — Therapy (Signed)
[facility] [address] [place], [address] Phone: [phone]   Fax:  [phone]  Physical Therapy Treatment  Patient Details  Name: Glenn Perez MRN: [id number] Date of Birth: 1946-05-06 Referring Provider (PT): Aluisio MD   Encounter Date: 06/01/2019  PT End of Session - 06/01/19 1425    Visit Number  4    Number of Visits  13    Date for PT Re-Evaluation  06/29/19    PT Start Time  1115    PT Stop Time  1200    PT Time Calculation (min)  45 min    Activity Tolerance  Patient tolerated treatment well    Behavior During Therapy  [facility] for tasks assessed/performed       Past Medical History:  Diagnosis Date  . Syncope     Past Surgical History:  Procedure Laterality Date  . APPENDECTOMY    . KNEE SURGERY    . NOSE SURGERY     x 2.  . REPLACEMENT TOTAL KNEE    . SHOULDER SURGERY  10/2015   for partially torn rotator cuff and detached bicep tendon.    There were no vitals filed for this visit.  Subjective Assessment - 06/01/19 1423    Subjective  Patient states he feels he is making improvement but reports his knee continues to have tightness along his affected knee.    Pertinent History  L TKA, previous shoulder surgery, A-Fib, syncope episdoes    Limitations  Lifting    Diagnostic tests  X-Ray    Patient Stated Goals  Playing tennis    Currently in Pain?  Yes    Pain Score  3     Pain Location  Knee    Pain Orientation  Right    Pain Descriptors / Indicators  Aching    Pain Type  Acute pain    Pain Onset  More than a month ago    Pain Frequency  Intermittent      TREATMENT  Therapeutic Exercise:  Ball roll outs in sitting - x 20 TKE in standing - x 10 5 sec with dorsiflexion  Quad sets in long sitting with focus on quad activation - x 20 10sec  Mini SLR with focus on quad activation during the session -- x 10  Sit to stands -- with greater knee flexion on the affected side  versus the unaffected to improve weight bearing on the affected side Step ups onto 6" with pressing into knee flexion - x 10 Bike level 12 -- 6 min (unable to perform full revolution Ambulation with SPC and without UE support - 2 x 133ft Manual therapy  STM performed to quadriceps to decrease pain and spasms along the knee to improve pain and spasms along the affected LE  Performed therapeutic exercise to improve knee flexion/ext in standing and in sitting  PT Education - 06/01/19 1424    Education provided  Yes    Education Details  form/technique with exercise    Person(s) Educated  Patient    Methods  Explanation;Demonstration    Comprehension  Verbalized understanding;Returned demonstration          PT Long Term Goals - 05/18/19 1558      PT LONG TERM GOAL #1   Title  Patient will be independent with HEP focused on improving elbow strengthening and coordination.     Baseline  dependent with HEP    Time  6  Period  Weeks    Status  New    Target Date  05/18/19      PT LONG TERM GOAL #2   Title  Patient will improve his LEFS to over 64/80 to indicate significant improvement in LE function and better ability to walk.    Baseline  LEFS: 27/80    Time  6    Period  Weeks    Status  New    Target Date  05/18/19      PT LONG TERM GOAL #3   Title  Patient will have a worst pain of 2/10 to indicatine significant improvement in pain and spasms within the elbow.    Baseline  6/10 worst pain    Time  6    Period  Weeks    Status  New      PT LONG TERM GOAL #4   Title  Patient will be able to ambulate over one mile to return to recreational activity of hiking.    Baseline  unable to amb over 150ft    Time  6    Period  Weeks    Status  New            Plan - 06/01/19 1426    Clinical Impression Statement  Patient demonstrates improvement in knee flexion and knee extension most notabley after performing knee flexion improvement exercises and manual therapy  techniques focused on imprvong knee flexion. Although patient is improving, he continues to have limited knee flexion AROM ~10 degree lack of knee extension and a knee flexion measurement of around 80 degrees. Patient also demonstrates a lack of quad recruitement of the afected side limiting ability to perform terminal knee extension. Patient will benefit from further skilled therapy to return to prior level of function.    Personal Factors and Comorbidities  Age;Comorbidity 2    Comorbidities  a fib, Previous TKA, drainage with TKA    Examination-Activity Limitations  Bend;Lift;Caring for Others;Bathing;Squat;Stairs;Stand;Toileting;Transfers    Examination-Participation Restrictions  Church;Laundry;Other;Community Activity    Stability/Clinical Decision Making  Evolving/Moderate complexity    Rehab Potential  Good    Clinical Impairments Affecting Rehab Potential  (+) highly active, highly motivated (-) age    PT Frequency  2x / week    PT Duration  6 weeks    PT Treatment/Interventions  Iontophoresis 4mg /ml Dexamethasone;Moist Heat;Electrical Stimulation;Cryotherapy;Ultrasound;Neuromuscular re-education;Patient/family education;Passive range of motion;Dry needling    PT Next Visit Plan  Progress strengthening and AROM    PT Home Exercise Plan  See education section    Consulted and Agree with Plan of Care  Patient       Patient will benefit from skilled therapeutic intervention in order to improve the following deficits and impairments:  Pain, Increased muscle spasms, Increased fascial restricitons, Abnormal gait, Decreased balance, Decreased cognition, Decreased range of motion, Decreased coordination, Decreased strength, Decreased endurance, Decreased mobility, Decreased activity tolerance, Impaired sensation, Difficulty walking, Postural dysfunction  Visit Diagnosis: Acute pain of right knee  Stiffness of right knee, not elsewhere classified     Problem List Patient Active Problem  List   Diagnosis Date Noted  . Syncope 02/03/2019  . New onset atrial fibrillation ([place]) 02/03/2019  . Arthritis 07/30/2017  . Chronic rhinitis 07/30/2017    Blythe Stanford, PT DPT 06/01/2019, 2:41 PM  [facility] [address], [place], [address] Phone: [phone]   Fax:  [phone]  Name: Glenn Perez MRN: [id number]  Date of Birth: [birth year]

## 2019-06-05 ENCOUNTER — Ambulatory Visit: Payer: Medicare HMO

## 2019-06-06 ENCOUNTER — Ambulatory Visit: Payer: Medicare HMO

## 2019-06-06 ENCOUNTER — Other Ambulatory Visit: Payer: Self-pay

## 2019-06-06 DIAGNOSIS — M25561 Pain in right knee: Secondary | ICD-10-CM | POA: Diagnosis not present

## 2019-06-06 DIAGNOSIS — M25661 Stiffness of right knee, not elsewhere classified: Secondary | ICD-10-CM | POA: Diagnosis not present

## 2019-06-06 NOTE — Therapy (Signed)
[facility] [address], [place], [address] Phone: [phone]   Fax:  [phone]  Physical Therapy Treatment  Patient Details  Name: Glenn Perez MRN: [id number] Date of Birth: [birth year] Referring Provider (PT): Aluisio MD   Encounter Date: 06/06/2019  PT End of Session - 06/06/19 1536    Visit Number  5    Number of Visits  13    Date for PT Re-Evaluation  06/29/19    PT Start Time  1430    PT Stop Time  1515    PT Time Calculation (min)  45 min    Activity Tolerance  Patient tolerated treatment well    Behavior During Therapy  [facility] for tasks assessed/performed       Past Medical History:  Diagnosis Date  . Syncope     Past Surgical History:  Procedure Laterality Date  . APPENDECTOMY    . KNEE SURGERY    . NOSE SURGERY     x 2.  . REPLACEMENT TOTAL KNEE    . SHOULDER SURGERY  10/2015   for partially torn rotator cuff and detached bicep tendon.    There were no vitals filed for this visit.  Subjective Assessment - 06/06/19 1530    Subjective  Patient reports he saw his MD which pulled 50cc's of fluid off of the knee. Patient states he has been ambulating around his home.    Pertinent History  L TKA, previous shoulder surgery, A-Fib, syncope episdoes    Limitations  Lifting    Diagnostic tests  X-Ray    Patient Stated Goals  Playing tennis    Currently in Pain?  Yes    Pain Score  2     Pain Location  Knee    Pain Orientation  Right    Pain Descriptors / Indicators  Aching    Pain Type  Acute pain    Pain Onset  More than a month ago    Pain Frequency  Intermittent       TREATMENT  Therapeutic Exercise:  Seated long arc quads - x 15 TKE in standing - x 20 5 sec with dorsiflexion with GTB  Quad sets in long sitting with focus on quad activation - x 20; 5sec holds Mini SLR with focus on quad activation during the session -- x 20  Sit to stands -- with greater knee flexion  on the affected side versus the unaffected to improve weight bearing on the affected side - x12 without UE support Bike level 12 -- 8 min (able to perform backward full revolution - towards the chair) Ambulation with SPC and without UE support - x142ft Prone lying with knee extension hangs - x 20  Manual therapy  STM performed to quadriceps to decrease pain and spasms along the knee to improve pain and spasms along the affected LE  Performed therapeutic exercise to improve knee flexion/ext in standing and in sitting   PT Education - 06/06/19 1534    Education provided  Yes    Education Details  form/technique with exercise    Person(s) Educated  Patient    Methods  Explanation;Demonstration    Comprehension  Verbalized understanding;Returned demonstration          PT Long Term Goals - 05/18/19 1558      PT LONG TERM GOAL #1   Title  Patient will be independent with HEP focused on improving elbow strengthening and coordination.  Baseline  dependent with HEP    Time  6    Period  Weeks    Status  New    Target Date  05/18/19      PT LONG TERM GOAL #2   Title  Patient will improve his LEFS to over 64/80 to indicate significant improvement in LE function and better ability to walk.    Baseline  LEFS: 27/80    Time  6    Period  Weeks    Status  New    Target Date  05/18/19      PT LONG TERM GOAL #3   Title  Patient will have a worst pain of 2/10 to indicatine significant improvement in pain and spasms within the elbow.    Baseline  6/10 worst pain    Time  6    Period  Weeks    Status  New      PT LONG TERM GOAL #4   Title  Patient will be able to ambulate over one mile to return to recreational activity of hiking.    Baseline  unable to amb over 129ft    Time  6    Period  Weeks    Status  New            Plan - 06/06/19 1537    Clinical Impression Statement  Patient demonstrates improvement with knee flexion and extension today able to achieve -7 degrees  ext and 80 degrees of flexion. Patient demonstrates improvement in quad control as well, however, has difficulty with muscular activation in standing requiring cueing to activate his dorsiflexors to activate. Patient will benefit from further skilled therapy to return to prior level of function.    Personal Factors and Comorbidities  Age;Comorbidity 2    Comorbidities  a fib, Previous TKA, drainage with TKA    Examination-Activity Limitations  Bend;Lift;Caring for Others;Bathing;Squat;Stairs;Stand;Toileting;Transfers    Examination-Participation Restrictions  Church;Laundry;Other;Community Activity    Stability/Clinical Decision Making  Evolving/Moderate complexity    Rehab Potential  Good    Clinical Impairments Affecting Rehab Potential  (+) highly active, highly motivated (-) age    PT Frequency  2x / week    PT Duration  6 weeks    PT Treatment/Interventions  Iontophoresis 4mg /ml Dexamethasone;Moist Heat;Electrical Stimulation;Cryotherapy;Ultrasound;Neuromuscular re-education;Patient/family education;Passive range of motion;Dry needling    PT Next Visit Plan  Progress strengthening and AROM    PT Home Exercise Plan  See education section    Consulted and Agree with Plan of Care  Patient       Patient will benefit from skilled therapeutic intervention in order to improve the following deficits and impairments:  Pain, Increased muscle spasms, Increased fascial restricitons, Abnormal gait, Decreased balance, Decreased cognition, Decreased range of motion, Decreased coordination, Decreased strength, Decreased endurance, Decreased mobility, Decreased activity tolerance, Impaired sensation, Difficulty walking, Postural dysfunction  Visit Diagnosis: Acute pain of right knee  Stiffness of right knee, not elsewhere classified     Problem List Patient Active Problem List   Diagnosis Date Noted  . Syncope 02/03/2019  . New onset atrial fibrillation ([place]) 02/03/2019  . Arthritis 07/30/2017   . Chronic rhinitis 07/30/2017    Blythe Stanford, PT DPT 06/06/2019, 3:46 PM  [place] [facility] [address] [place], [address] Phone: [phone]   Fax:  [phone]  Name: Glenn Perez MRN: [id number] Date of Birth: 06-Sep-1946

## 2019-06-08 ENCOUNTER — Ambulatory Visit: Payer: Medicare HMO

## 2019-06-08 ENCOUNTER — Other Ambulatory Visit: Payer: Self-pay

## 2019-06-08 DIAGNOSIS — M25661 Stiffness of right knee, not elsewhere classified: Secondary | ICD-10-CM

## 2019-06-08 DIAGNOSIS — M25561 Pain in right knee: Secondary | ICD-10-CM

## 2019-06-08 NOTE — Therapy (Signed)
[facility] [address], [place], [address] Phone: [phone]   Fax:  [phone]  Physical Therapy Treatment  Patient Details  Name: Glenn Perez MRN: [id number] Date of Birth: [birth year] Referring Provider (PT): Aluisio MD   Encounter Date: 06/08/2019  PT End of Session - 06/08/19 1208    Visit Number  6    Number of Visits  13    Date for PT Re-Evaluation  06/29/19    PT Start Time  1115    PT Stop Time  1200    PT Time Calculation (min)  45 min    Activity Tolerance  Patient tolerated treatment well    Behavior During Therapy  [facility] for tasks assessed/performed       Past Medical History:  Diagnosis Date  . Syncope     Past Surgical History:  Procedure Laterality Date  . APPENDECTOMY    . KNEE SURGERY    . NOSE SURGERY     x 2.  . REPLACEMENT TOTAL KNEE    . SHOULDER SURGERY  10/2015   for partially torn rotator cuff and detached bicep tendon.    There were no vitals filed for this visit.  Subjective Assessment - 06/08/19 1206    Subjective  Patient presents to PT without AD. Reports he's not sleeping well (2-3 hr/night) due to mattress not being supportive enough.    Pertinent History  L TKA, previous shoulder surgery, A-Fib, syncope episdoes    Limitations  Lifting    Diagnostic tests  X-Ray    Patient Stated Goals  Playing tennis    Currently in Pain?  Yes    Pain Score  2     Pain Location  Knee    Pain Onset  More than a month ago    Multiple Pain Sites  No       Manual therapy STM quad x 2 min to improve tissue extensibility and therapy tolerance Patellar mobilizations all directions for increased ROM  Therex Hamstring stretch seated with foot on stool, use of BUE to increase extension 3 x 30 sec Contract relax stretch into flexion x 3 for increased ROM HS AROM with red theraband 2 x 10 for strength and ROM LAQ AROM with therapist resistance reduced to antigravity  due to patient tolerance x 10 Stairs active ROM with foot on second stair x 20, first stair x 20 Stairs up down x 10 with cues for using right foot and right rail to match home setup TKE with green TB x 10, x10 with DF - cues for improved excursion and target muscle activation and reduction of compensation Bike x 10 min level 11, adjusted to level 12 to avoid compensation. Pt able to achieve full rotation by end of exercise with minimal compensation (circumduction at hip) Squats 2 x 10 with verbal cues for use of UEs and even WB Ambulation 300 ft with verbal cues for correction of ATP compensation  Cryotherapy to improve pain and speed recovery following session Education on muscle recovery with focus on sleep and heat post workout     PT Education - 06/08/19 1207    Education provided  Yes    Education Details  form/technique, muscle recovery including sleep and heat    Person(s) Educated  Patient    Methods  Explanation;Demonstration;Tactile cues;Verbal cues    Comprehension  Verbalized understanding;Returned demonstration;Verbal cues required;Tactile cues required  PT Long Term Goals - 05/18/19 1558      PT LONG TERM GOAL #1   Title  Patient will be independent with HEP focused on improving elbow strengthening and coordination.     Baseline  dependent with HEP    Time  6    Period  Weeks    Status  New    Target Date  05/18/19      PT LONG TERM GOAL #2   Title  Patient will improve his LEFS to over 64/80 to indicate significant improvement in LE function and better ability to walk.    Baseline  LEFS: 27/80    Time  6    Period  Weeks    Status  New    Target Date  05/18/19      PT LONG TERM GOAL #3   Title  Patient will have a worst pain of 2/10 to indicatine significant improvement in pain and spasms within the elbow.    Baseline  6/10 worst pain    Time  6    Period  Weeks    Status  New      PT LONG TERM GOAL #4   Title  Patient will be able to  ambulate over one mile to return to recreational activity of hiking.    Baseline  unable to amb over 166ft    Time  6    Period  Weeks    Status  New            Plan - 06/08/19 1208    Clinical Impression Statement  Patient demonstrates improvement in gait kinematics with stairs and over level surfaces. Compensation with anterior pelvic tilt/flexion lean with extension in gait and with TKE exercise. Patient will require further skilled therapy to return to PLOF.    Personal Factors and Comorbidities  Age;Comorbidity 2    Comorbidities  a fib, Previous TKA, drainage with TKA    Examination-Activity Limitations  Bend;Lift;Caring for Others;Bathing;Squat;Stairs;Stand;Toileting;Transfers    Examination-Participation Restrictions  Church;Laundry;Other;Community Activity    Stability/Clinical Decision Making  Evolving/Moderate complexity    Rehab Potential  Good    Clinical Impairments Affecting Rehab Potential  (+) highly active, highly motivated (-) age    PT Frequency  2x / week    PT Duration  6 weeks    PT Treatment/Interventions  Iontophoresis 4mg /ml Dexamethasone;Moist Heat;Electrical Stimulation;Cryotherapy;Ultrasound;Neuromuscular re-education;Patient/family education;Passive range of motion;Dry needling    PT Next Visit Plan  Progress strengthening and AROM    PT Home Exercise Plan  See education section    Consulted and Agree with Plan of Care  Patient       Patient will benefit from skilled therapeutic intervention in order to improve the following deficits and impairments:  Pain, Increased muscle spasms, Increased fascial restricitons, Abnormal gait, Decreased balance, Decreased cognition, Decreased range of motion, Decreased coordination, Decreased strength, Decreased endurance, Decreased mobility, Decreased activity tolerance, Impaired sensation, Difficulty walking, Postural dysfunction  Visit Diagnosis: Acute pain of right knee  Stiffness of right knee, not elsewhere  classified     Problem List Patient Active Problem List   Diagnosis Date Noted  . Syncope 02/03/2019  . New onset atrial fibrillation ([place]) 02/03/2019  . Arthritis 07/30/2017  . Chronic rhinitis 07/30/2017    Virgia Land, SPT 06/08/2019, 12:11 PM  [place] [facility] [address] [place], [address] Phone: [phone]   Fax:  [phone]  Name: Glenn Perez MRN:  [id number] Date of Birth: Feb 10, 1946

## 2019-06-12 ENCOUNTER — Ambulatory Visit: Payer: Medicare HMO

## 2019-06-12 ENCOUNTER — Other Ambulatory Visit: Payer: Self-pay

## 2019-06-12 DIAGNOSIS — M25661 Stiffness of right knee, not elsewhere classified: Secondary | ICD-10-CM

## 2019-06-12 DIAGNOSIS — M25561 Pain in right knee: Secondary | ICD-10-CM

## 2019-06-12 NOTE — Therapy (Signed)
[facility] [address] [place], [address] Phone: [phone]   Fax:  [phone]  Physical Therapy Treatment  Patient Details  Name: Glenn Perez MRN: [id number] Date of Birth: [birth year] Referring Provider (PT): Aluisio MD   Encounter Date: 06/12/2019  PT End of Session - 06/12/19 1216    Visit Number  7    Number of Visits  13    Date for PT Re-Evaluation  06/29/19    PT Start Time  1105    PT Stop Time  1200    PT Time Calculation (min)  55 min    Activity Tolerance  Patient tolerated treatment well    Behavior During Therapy  [facility] for tasks assessed/performed       Past Medical History:  Diagnosis Date  . Syncope     Past Surgical History:  Procedure Laterality Date  . APPENDECTOMY    . KNEE SURGERY    . NOSE SURGERY     x 2.  . REPLACEMENT TOTAL KNEE    . SHOULDER SURGERY  10/2015   for partially torn rotator cuff and detached bicep tendon.    There were no vitals filed for this visit.  Subjective Assessment - 06/12/19 1104    Subjective  Patient reports that he''s struggling to move after PT session due to overdoing it for multiple days. Reports that wife thinks he's behind schedule but he is satisfied with progress. However he feels the bike may be too challenging.    Pertinent History  L TKA, previous shoulder surgery, A-Fib, syncope episdoes    Limitations  Lifting    Diagnostic tests  X-Ray    Patient Stated Goals  Playing tennis    Currently in Pain?  No/denies    Pain Onset  More than a month ago    Multiple Pain Sites  No         Observation: Pelvic circumduction compensation at weight acceptance in stance phase  Manual therapy Extension -6 deg. Flexion 80 -> 85 deg Patellar mobilizations all directions to improve knee ROM Femorotibial mobilization for flexion and extension grades 1-2 to improve ROM STM quads with trigger point release x 5 min to improve  tissue extensibility, reduce pain  Therex: Weight shift onto R with cues for upright trunk to reduce compensatory motor patterning Frenkel gait exercises with cues for hip extension to reduce compensatory motor patterning -- 5 min Glute med hip hikes to reduce compensatory motor patterning with gait -- 5 min Active stretch gastroc/soleus -- x 3 min AROM with slider into flexion -- x 20  Stairs up and down with cues for reduction of pelvic circumduction compensation -- x 20  Step-ups with cues for hip and knee extension to reduce compensation and improve target muscle activation -- x 20 TKE with green TB resistance and verbal tactile cues for improved target muscle activation -- x 20   UNBILLED Moist heat for improved recovery and pain reduction following therapy with patient positioned in sitting TENS x 10 min  High volt, continuous 330V @ 100 pps and 2 sec ramp time  Goal: to decrease pain and improve knee ext/flexion AROM      PT Education - 06/12/19 1101    Education provided  Yes    Education Details  form/technique    Person(s) Educated  Patient    Methods  Explanation;Demonstration;Tactile cues;Verbal cues    Comprehension  Verbalized understanding;Returned demonstration;Verbal cues required;Tactile cues required  PT Long Term Goals - 05/18/19 1558      PT LONG TERM GOAL #1   Title  Patient will be independent with HEP focused on improving elbow strengthening and coordination.     Baseline  dependent with HEP    Time  6    Period  Weeks    Status  New    Target Date  05/18/19      PT LONG TERM GOAL #2   Title  Patient will improve his LEFS to over 64/80 to indicate significant improvement in LE function and better ability to walk.    Baseline  LEFS: 27/80    Time  6    Period  Weeks    Status  New    Target Date  05/18/19      PT LONG TERM GOAL #3   Title  Patient will have a worst pain of 2/10 to indicatine significant improvement in pain and  spasms within the elbow.    Baseline  6/10 worst pain    Time  6    Period  Weeks    Status  New      PT LONG TERM GOAL #4   Title  Patient will be able to ambulate over one mile to return to recreational activity of hiking.    Baseline  unable to amb over 196ft    Time  6    Period  Weeks    Status  New            Plan - 06/12/19 1246    Clinical Impression Statement  Patient shows setback in pain levels and movement quality with gait possibly due to higher intensity exercise during previous session. Pelvic circumduction compensation with gait more pronounced today but patient able to correct with cues with some exercises. ROM and swelling shows mild gains relative to prior sessions. Patient will require further skilled therapy to return to PLOF.    Personal Factors and Comorbidities  Age;Comorbidity 2    Comorbidities  a fib, Previous TKA, drainage with TKA    Examination-Activity Limitations  Bend;Lift;Caring for Others;Bathing;Squat;Stairs;Stand;Toileting;Transfers    Examination-Participation Restrictions  Church;Laundry;Other;Community Activity    Stability/Clinical Decision Making  Evolving/Moderate complexity    Rehab Potential  Good    Clinical Impairments Affecting Rehab Potential  (+) highly active, highly motivated (-) age    PT Frequency  2x / week    PT Duration  6 weeks    PT Treatment/Interventions  Iontophoresis 4mg /ml Dexamethasone;Moist Heat;Electrical Stimulation;Cryotherapy;Ultrasound;Neuromuscular re-education;Patient/family education;Passive range of motion;Dry needling    PT Next Visit Plan  Progress strengthening and AROM    PT Home Exercise Plan  See education section    Consulted and Agree with Plan of Care  Patient       Patient will benefit from skilled therapeutic intervention in order to improve the following deficits and impairments:  Pain, Increased muscle spasms, Increased fascial restricitons, Abnormal gait, Decreased balance, Decreased  cognition, Decreased range of motion, Decreased coordination, Decreased strength, Decreased endurance, Decreased mobility, Decreased activity tolerance, Impaired sensation, Difficulty walking, Postural dysfunction  Visit Diagnosis: Acute pain of right knee  Stiffness of right knee, not elsewhere classified     Problem List Patient Active Problem List   Diagnosis Date Noted  . Syncope 02/03/2019  . New onset atrial fibrillation ([place]) 02/03/2019  . Arthritis 07/30/2017  . Chronic rhinitis 07/30/2017    Virgia Land, SPT 06/12/2019, 12:58 PM  [facility] [address] [place], [address] Phone: [phone]   Fax:  [phone]  Name: MOHAMADOU ROUGHTON MRN: [id number] Date of Birth: 1946/04/23

## 2019-06-15 ENCOUNTER — Ambulatory Visit: Payer: Medicare HMO

## 2019-06-15 ENCOUNTER — Other Ambulatory Visit: Payer: Self-pay

## 2019-06-15 DIAGNOSIS — M25661 Stiffness of right knee, not elsewhere classified: Secondary | ICD-10-CM | POA: Diagnosis not present

## 2019-06-15 DIAGNOSIS — M25561 Pain in right knee: Secondary | ICD-10-CM

## 2019-06-15 NOTE — Therapy (Signed)
[facility] [address], [place], [address] Phone: [phone]   Fax:  [phone]  Physical Therapy Treatment  Patient Details  Name: Glenn Perez MRN: [id number] Date of Birth: January 05, 1946 Referring Provider (PT): Aluisio MD   Encounter Date: 06/15/2019  PT End of Session - 06/15/19 1601    Visit Number  8    Number of Visits  13    Date for PT Re-Evaluation  06/29/19    PT Start Time  1115    PT Stop Time  1200    PT Time Calculation (min)  45 min    Activity Tolerance  Patient tolerated treatment well    Behavior During Therapy  [facility] for tasks assessed/performed       Past Medical History:  Diagnosis Date  . Syncope     Past Surgical History:  Procedure Laterality Date  . APPENDECTOMY    . KNEE SURGERY    . NOSE SURGERY     x 2.  . REPLACEMENT TOTAL KNEE    . SHOULDER SURGERY  10/2015   for partially torn rotator cuff and detached bicep tendon.    There were no vitals filed for this visit.  Subjective Assessment - 06/15/19 1435    Subjective  Patient reports increased difficulty with walking and reports he has been having pain along the bottom side of the foot. Patient states singificant soreness and pain underneath his foot, reports he's had difficulty with weight bearing ever since pushing himself with performing his exercises.    Pertinent History  L TKA, previous shoulder surgery, A-Fib, syncope episdoes    Limitations  Lifting    Diagnostic tests  X-Ray    Patient Stated Goals  Playing tennis    Currently in Pain?  No/denies    Pain Onset  More than a month ago       TREATMENT Therapeutic Exercise Ankle dorsiflexion/plantartflexion - x 20  Ankle inversion/eversion - x 20 Ankle circles with leg suspended - x 20  Weight shifting in standing with use of FWW-x 20  Ambulation with focus on improving plantarflexion - x 20   Manual therapy Effleurage to ankle using sweeping  techniques AP mobilizations performing the ankle  STM performed along the plantar aspect of the foot to decrease increased pain and spasms with patient's foots elevated onto a block  Performed exercises to improve ankle mobilization and decrease swelling along the foot/ankle     PT Education - 06/15/19 1600    Education provided  Yes    Education Details  Added: ankle inversion/eversion/plantarflexion/dorsiflexion with movement; elevating LE above heart level    Person(s) Educated  Patient    Methods  Explanation;Demonstration    Comprehension  Returned demonstration;Verbalized understanding          PT Long Term Goals - 05/18/19 1558      PT LONG TERM GOAL #1   Title  Patient will be independent with HEP focused on improving elbow strengthening and coordination.     Baseline  dependent with HEP    Time  6    Period  Weeks    Status  New    Target Date  05/18/19      PT LONG TERM GOAL #2   Title  Patient will improve his LEFS to over 64/80 to indicate significant improvement in LE function and better ability to walk.    Baseline  LEFS: 27/80    Time  6  Period  Weeks    Status  New    Target Date  05/18/19      PT LONG TERM GOAL #3   Title  Patient will have a worst pain of 2/10 to indicatine significant improvement in pain and spasms within the elbow.    Baseline  6/10 worst pain    Time  6    Period  Weeks    Status  New      PT LONG TERM GOAL #4   Title  Patient will be able to ambulate over one mile to return to recreational activity of hiking.    Baseline  unable to amb over 182ft    Time  6    Period  Weeks    Status  New            Plan - 06/15/19 1653    Clinical Impression Statement  Patient demonstrates increased swelling along his ankle on the affected side with increased pain along the plantar aspect of the foot. Patient demonstrates improvement in gait and weight acceptance onto the affected side after performing effluerage and AROM ankle  motions on the affected side. Although patient's ankle/foot has increased pain, patient's knee AROM has improved since the previous session with ability to to perform 95 degrees of knee flexion and -5 degrees of knee extension. Patient is improving overall in terms of knee function and will continue to address ankle/foot limitations to better ambulate with less pain.    Personal Factors and Comorbidities  Age;Comorbidity 2    Comorbidities  a fib, Previous TKA, drainage with TKA    Examination-Activity Limitations  Bend;Lift;Caring for Others;Bathing;Squat;Stairs;Stand;Toileting;Transfers    Examination-Participation Restrictions  Church;Laundry;Other;Community Activity    Stability/Clinical Decision Making  Evolving/Moderate complexity    Rehab Potential  Good    Clinical Impairments Affecting Rehab Potential  (+) highly active, highly motivated (-) age    PT Frequency  2x / week    PT Duration  6 weeks    PT Treatment/Interventions  Iontophoresis 4mg /ml Dexamethasone;Moist Heat;Electrical Stimulation;Cryotherapy;Ultrasound;Neuromuscular re-education;Patient/family education;Passive range of motion;Dry needling    PT Next Visit Plan  Progress strengthening and AROM    PT Home Exercise Plan  See education section    Consulted and Agree with Plan of Care  Patient       Patient will benefit from skilled therapeutic intervention in order to improve the following deficits and impairments:  Pain, Increased muscle spasms, Increased fascial restricitons, Abnormal gait, Decreased balance, Decreased cognition, Decreased range of motion, Decreased coordination, Decreased strength, Decreased endurance, Decreased mobility, Decreased activity tolerance, Impaired sensation, Difficulty walking, Postural dysfunction  Visit Diagnosis: Acute pain of right knee  Stiffness of right knee, not elsewhere classified     Problem List Patient Active Problem List   Diagnosis Date Noted  . Syncope 02/03/2019  .  New onset atrial fibrillation ([place]) 02/03/2019  . Arthritis 07/30/2017  . Chronic rhinitis 07/30/2017    Blythe Stanford, PT DPT 06/15/2019, 4:58 PM  [facility] [address] [place], [address] Phone: [phone]   Fax:  [phone]  Name: Glenn Perez MRN: [id number] Date of Birth: September 15, 1946

## 2019-06-19 ENCOUNTER — Ambulatory Visit: Payer: Medicare HMO

## 2019-06-19 ENCOUNTER — Other Ambulatory Visit: Payer: Self-pay

## 2019-06-19 DIAGNOSIS — M25661 Stiffness of right knee, not elsewhere classified: Secondary | ICD-10-CM | POA: Diagnosis not present

## 2019-06-19 DIAGNOSIS — M25561 Pain in right knee: Secondary | ICD-10-CM | POA: Diagnosis not present

## 2019-06-19 NOTE — Therapy (Signed)
[facility] [address] [place], [address] Phone: [phone]   Fax:  [phone]  Physical Therapy Treatment  Patient Details  Name: Glenn Perez MRN: [id number] Date of Birth: 1946-06-11 Referring Provider (PT): Aluisio MD   Encounter Date: 06/19/2019  PT End of Session - 06/19/19 1224    Visit Number  9    Number of Visits  13    Date for PT Re-Evaluation  06/29/19    PT Start Time  1115    PT Stop Time  1200    PT Time Calculation (min)  45 min    Activity Tolerance  Patient tolerated treatment well    Behavior During Therapy  [facility] for tasks assessed/performed       Past Medical History:  Diagnosis Date  . Syncope     Past Surgical History:  Procedure Laterality Date  . APPENDECTOMY    . KNEE SURGERY    . NOSE SURGERY     x 2.  . REPLACEMENT TOTAL KNEE    . SHOULDER SURGERY  10/2015   for partially torn rotator cuff and detached bicep tendon.    There were no vitals filed for this visit.  Subjective Assessment - 06/19/19 1159    Subjective  Patient reports his knee has been feeling much better since the previous thursday. Patient states improvement with walking but is still have difficulties with knee AROM.    Pertinent History  L TKA, previous shoulder surgery, A-Fib, syncope episdoes    Limitations  Lifting    Diagnostic tests  X-Ray    Patient Stated Goals  Playing tennis    Currently in Pain?  No/denies    Pain Onset  More than a month ago       Therex Hamstring stretch in long sitting, use of BUE to increase extension 3 x 30 sec Contract relax stretch into flexion x 3 for increased ROM TKE with green TB x 10, x10 with DF - cues for improved excursion and target muscle activation and reduction of compensation Squats -- x 10 with verbal cues for use of UEs and even WB Split squats in standing - x 15 Lunges in standing - 2 x 10  Leg Press unilaterally - 3 x 10 30# Ball  roll outs in sitting - x 20   Unbilled after session: Cryotherapy and High Volt E-stim to decrease inflammation and swelling with LE's elevated above heart level with patient positioned in supine. X67min with two large e-stim pads placed along the affected LE's quad   Cryotherapy to improve pain and speed recovery following session Education on muscle recovery with focus on sleep and heat post workout  PT Education - 06/19/19 1223    Education provided  Yes    Education Details  form/technique with exercises; lunges, split squats, straight leg knee extension with towel under ankle    Person(s) Educated  Patient    Methods  Explanation;Demonstration    Comprehension  Verbalized understanding;Returned demonstration          PT Long Term Goals - 05/18/19 1558      PT LONG TERM GOAL #1   Title  Patient will be independent with HEP focused on improving elbow strengthening and coordination.     Baseline  dependent with HEP    Time  6    Period  Weeks    Status  New    Target Date  05/18/19  PT LONG TERM GOAL #2   Title  Patient will improve his LEFS to over 64/80 to indicate significant improvement in LE function and better ability to walk.    Baseline  LEFS: 27/80    Time  6    Period  Weeks    Status  New    Target Date  05/18/19      PT LONG TERM GOAL #3   Title  Patient will have a worst pain of 2/10 to indicatine significant improvement in pain and spasms within the elbow.    Baseline  6/10 worst pain    Time  6    Period  Weeks    Status  New      PT LONG TERM GOAL #4   Title  Patient will be able to ambulate over one mile to return to recreational activity of hiking.    Baseline  unable to amb over 187ft    Time  6    Period  Weeks    Status  New            Plan - 06/19/19 1238    Clinical Impression Statement  Patient demonstrates overall less pain during and after today's session. Patient also able to improve knee flexion to 99degrees passively and  90 degreee actively more than any other visit. Patient demosntrates no increase in foot pain and is improving overall. Will continue to progress patient in terms of improving knee flexion and extension with exercises and manual therapy as indicated. Patient will benefit from further skilled therapy to return to prior level of function.    Personal Factors and Comorbidities  Age;Comorbidity 2    Comorbidities  a fib, Previous TKA, drainage with TKA    Examination-Activity Limitations  Bend;Lift;Caring for Others;Bathing;Squat;Stairs;Stand;Toileting;Transfers    Examination-Participation Restrictions  Church;Laundry;Other;Community Activity    Stability/Clinical Decision Making  Evolving/Moderate complexity    Rehab Potential  Good    Clinical Impairments Affecting Rehab Potential  (+) highly active, highly motivated (-) age    PT Frequency  2x / week    PT Duration  6 weeks    PT Treatment/Interventions  Iontophoresis 4mg /ml Dexamethasone;Moist Heat;Electrical Stimulation;Cryotherapy;Ultrasound;Neuromuscular re-education;Patient/family education;Passive range of motion;Dry needling    PT Next Visit Plan  Progress strengthening and AROM    PT Home Exercise Plan  See education section    Consulted and Agree with Plan of Care  Patient       Patient will benefit from skilled therapeutic intervention in order to improve the following deficits and impairments:  Pain, Increased muscle spasms, Increased fascial restricitons, Abnormal gait, Decreased balance, Decreased cognition, Decreased range of motion, Decreased coordination, Decreased strength, Decreased endurance, Decreased mobility, Decreased activity tolerance, Impaired sensation, Difficulty walking, Postural dysfunction  Visit Diagnosis: Acute pain of right knee  Stiffness of right knee, not elsewhere classified     Problem List Patient Active Problem List   Diagnosis Date Noted  . Syncope 02/03/2019  . New onset atrial fibrillation  ([place]) 02/03/2019  . Arthritis 07/30/2017  . Chronic rhinitis 07/30/2017    Blythe Stanford, PT DPT 06/19/2019, 12:44 PM  [facility] [address] [place], [address] Phone: [phone]   Fax:  [phone]  Name: Glenn Perez MRN: [id number] Date of Birth: 08-05-1946

## 2019-06-29 ENCOUNTER — Ambulatory Visit: Payer: Medicare HMO | Attending: Orthopedic Surgery

## 2019-06-29 ENCOUNTER — Other Ambulatory Visit: Payer: Self-pay

## 2019-06-29 DIAGNOSIS — M25561 Pain in right knee: Secondary | ICD-10-CM | POA: Insufficient documentation

## 2019-06-29 DIAGNOSIS — M25661 Stiffness of right knee, not elsewhere classified: Secondary | ICD-10-CM | POA: Insufficient documentation

## 2019-06-29 DIAGNOSIS — Z96651 Presence of right artificial knee joint: Secondary | ICD-10-CM | POA: Diagnosis not present

## 2019-06-29 DIAGNOSIS — Z471 Aftercare following joint replacement surgery: Secondary | ICD-10-CM | POA: Diagnosis not present

## 2019-06-29 NOTE — Therapy (Signed)
[facility] [address] [place], [address] Phone: [phone]   Fax:  [phone]  Physical Therapy Treatment  Patient Details  Name: Glenn Perez MRN: [id number] Date of Birth: 1946-07-19 Referring Provider (PT): Aluisio MD   Encounter Date: 06/29/2019  PT End of Session - 06/29/19 1247    Visit Number  10    Number of Visits  13    Date for PT Re-Evaluation  06/29/19    PT Start Time  1115    PT Stop Time  1200    PT Time Calculation (min)  45 min    Activity Tolerance  Patient tolerated treatment well    Behavior During Therapy  [facility] for tasks assessed/performed       Past Medical History:  Diagnosis Date  . Syncope     Past Surgical History:  Procedure Laterality Date  . APPENDECTOMY    . KNEE SURGERY    . NOSE SURGERY     x 2.  . REPLACEMENT TOTAL KNEE    . SHOULDER SURGERY  10/2015   for partially torn rotator cuff and detached bicep tendon.    There were no vitals filed for this visit.  Subjective Assessment - 06/29/19 1129    Subjective  Patient states his time at the beach was beenficial and reports he was able to perform his exercises without difficulty. Patient reports he went to his 57 office who is happy with his progress.    Pertinent History  L TKA, previous shoulder surgery, A-Fib, syncope episdoes    Limitations  Lifting    Diagnostic tests  X-Ray    Patient Stated Goals  Playing tennis    Currently in Pain?  No/denies    Pain Onset  More than a month ago         TREATMENT Therapeutic Exercise Sitting ball rolls -- x 5 min Squats with affected leg on step -- x20 Knee flexion with grey band arounnd foot -- x20 Foot on step hip flexor stretch -- x 20  Squats on total gym -- x 20 level 26 Single leg squats on total gym -- x20 level 18 Single leg stance on airex pad -- x 20 Quad sets in sitting -- x 20   Performed exercises to improve LE  strength  Unbilled after session: Cryotherapy and High Volt E-stim to decrease inflammation and swelling with LE's elevated above heart level with patient positioned in supine. X69min with two large e-stim pads placed along the affected LE's quad  PT Education - 06/29/19 1246    Education provided  Yes    Education Details  Form/technique with exercise; straight leg quad set    Person(s) Educated  Patient    Methods  Explanation;Demonstration    Comprehension  Verbalized understanding;Returned demonstration          PT Long Term Goals - 06/29/19 1255      PT LONG TERM GOAL #1   Title  Patient will be independent with HEP focused on improving elbow strengthening and coordination.     Baseline  dependent with HEP; 06/29/2019: independent with exercise    Time  6    Period  Weeks    Status  Achieved      PT LONG TERM GOAL #2   Title  Patient will improve his LEFS to over 64/80 to indicate significant improvement in LE function and better ability to walk.    Baseline  LEFS:  27/80; 06/29/2019: 44/80    Time  6    Period  Weeks    Status  On-going      PT LONG TERM GOAL #3   Title  Patient will have a worst pain of 2/10 to indicatine significant improvement in pain and spasms within the elbow.    Baseline  6/10 worst pain; 06/29/2019: 6/10    Time  6    Period  Weeks    Status  On-going      PT LONG TERM GOAL #4   Title  Patient will be able to ambulate over one mile to return to recreational activity of hiking.    Baseline  unable to amb over 131ft; 06/29/2019: 1078ft    Time  6    Period  Weeks    Status  On-going            Plan - 06/29/19 1248    Clinical Impression Statement  Patient demonstrates improvement during today's session and is making progress towards long term goals. Patient demonstrates ability to perform 114 degrees of knee flexion and 4 degrees from neutral. Patient also demosntrates improvement with LEFS and walking ability. Patient is improving  overall but continues to have limitations with walking, benidng, and AROM knee flexion. Patient will benefit from further skilled therapy to return to prior level of function.    Personal Factors and Comorbidities  Age;Comorbidity 2    Comorbidities  a fib, Previous TKA, drainage with TKA    Examination-Activity Limitations  Bend;Lift;Caring for Others;Bathing;Squat;Stairs;Stand;Toileting;Transfers    Examination-Participation Restrictions  Church;Laundry;Other;Community Activity    Stability/Clinical Decision Making  Evolving/Moderate complexity    Rehab Potential  Good    Clinical Impairments Affecting Rehab Potential  (+) highly active, highly motivated (-) age    PT Frequency  2x / week    PT Duration  6 weeks    PT Treatment/Interventions  Iontophoresis 4mg /ml Dexamethasone;Moist Heat;Electrical Stimulation;Cryotherapy;Ultrasound;Neuromuscular re-education;Patient/family education;Passive range of motion;Dry needling    PT Next Visit Plan  Progress strengthening and AROM    PT Home Exercise Plan  See education section    Consulted and Agree with Plan of Care  Patient       Patient will benefit from skilled therapeutic intervention in order to improve the following deficits and impairments:  Pain, Increased muscle spasms, Increased fascial restricitons, Abnormal gait, Decreased balance, Decreased cognition, Decreased range of motion, Decreased coordination, Decreased strength, Decreased endurance, Decreased mobility, Decreased activity tolerance, Impaired sensation, Difficulty walking, Postural dysfunction  Visit Diagnosis: Acute pain of right knee  Stiffness of right knee, not elsewhere classified     Problem List Patient Active Problem List   Diagnosis Date Noted  . Syncope 02/03/2019  . New onset atrial fibrillation ([place]) 02/03/2019  . Arthritis 07/30/2017  . Chronic rhinitis 07/30/2017    Blythe Stanford, PT DPT 06/29/2019, 1:00 PM  [facility] [address], [place], [address] Phone: [phone]   Fax:  [phone]  Name: Glenn Perez MRN: [id number] Date of Birth: 02-11-1946

## 2019-07-03 ENCOUNTER — Other Ambulatory Visit: Payer: Self-pay

## 2019-07-03 ENCOUNTER — Ambulatory Visit: Payer: Medicare HMO

## 2019-07-03 DIAGNOSIS — M25661 Stiffness of right knee, not elsewhere classified: Secondary | ICD-10-CM

## 2019-07-03 DIAGNOSIS — Z9889 Other specified postprocedural states: Secondary | ICD-10-CM | POA: Diagnosis not present

## 2019-07-03 DIAGNOSIS — Z20828 Contact with and (suspected) exposure to other viral communicable diseases: Secondary | ICD-10-CM | POA: Diagnosis not present

## 2019-07-03 DIAGNOSIS — R531 Weakness: Secondary | ICD-10-CM | POA: Diagnosis not present

## 2019-07-03 DIAGNOSIS — R55 Syncope and collapse: Secondary | ICD-10-CM | POA: Diagnosis not present

## 2019-07-03 DIAGNOSIS — I4891 Unspecified atrial fibrillation: Secondary | ICD-10-CM | POA: Diagnosis not present

## 2019-07-03 DIAGNOSIS — R002 Palpitations: Secondary | ICD-10-CM | POA: Diagnosis not present

## 2019-07-03 DIAGNOSIS — M25561 Pain in right knee: Secondary | ICD-10-CM | POA: Diagnosis not present

## 2019-07-03 DIAGNOSIS — M1710 Unilateral primary osteoarthritis, unspecified knee: Secondary | ICD-10-CM | POA: Diagnosis not present

## 2019-07-03 DIAGNOSIS — J329 Chronic sinusitis, unspecified: Secondary | ICD-10-CM | POA: Diagnosis not present

## 2019-07-03 DIAGNOSIS — Z20822 Contact with and (suspected) exposure to covid-19: Secondary | ICD-10-CM

## 2019-07-03 NOTE — Therapy (Signed)
[facility] [address] [place], [address] Phone: [phone]   Fax:  [phone]  Physical Therapy Treatment  Patient Details  Name: Glenn Perez MRN: [id number] Date of Birth: 11-27-1945 Referring Provider (PT): Aluisio MD   Encounter Date: 07/03/2019  PT End of Session - 07/03/19 1433    Visit Number  11    Number of Visits  13    Date for PT Re-Evaluation  06/29/19    PT Start Time  1430    PT Stop Time  1510    PT Time Calculation (min)  40 min    Activity Tolerance  No increased pain;Patient tolerated treatment well    Behavior During Therapy  [facility] for tasks assessed/performed       Past Medical History:  Diagnosis Date  . Syncope     Past Surgical History:  Procedure Laterality Date  . APPENDECTOMY    . KNEE SURGERY    . NOSE SURGERY     x 2.  . REPLACEMENT TOTAL KNEE    . SHOULDER SURGERY  10/2015   for partially torn rotator cuff and detached bicep tendon.    There were no vitals filed for this visit.  Subjective Assessment - 07/03/19 1432    Subjective  Pt reports his ankle swelling continues to be a problem with rehabing his ankle.    Pertinent History  L TKA, previous shoulder surgery, A-Fib, syncope episdoes    Currently in Pain?  No/denies        INTERVENTION  THIS DATE: -Rt heel slides on slider x2 minutes -ankle pumps seated, balls on 2x4 2x20 -Right knee lunging flexion stretch 5x15sec (110 degrees after -Heel hang stretch 2x30sec (bilat) -Standing knee flexion 2x15, 2lb AW  -Stand RLE hip/knee flexion to end range 2x15 c 2lb AW -Supine Rt knee sustained TKE stretch with 10lb AW at knee, heel elevated 3x60sec -Rt Femur on tibial posterior glide to improve TKE ROM 5x30sec Grade III posterior femoral glide -Rt SAQ 2x15 on bolster;  1x15 c 3lb weight 1x15 c 5lb  * ice concurrent with SAQ   PT Long Term Goals - 06/29/19 1255      PT LONG TERM GOAL #1   Title  Patient will be independent with HEP focused on improving elbow strengthening and coordination.     Baseline  dependent with HEP; 06/29/2019: independent with exercise    Time  6    Period  Weeks    Status  Achieved      PT LONG TERM GOAL #2   Title  Patient will improve his LEFS to over 64/80 to indicate significant improvement in LE function and better ability to walk.    Baseline  LEFS: 27/80; 06/29/2019: 44/80    Time  6    Period  Weeks    Status  On-going      PT LONG TERM GOAL #3   Title  Patient will have a worst pain of 2/10 to indicatine significant improvement in pain and spasms within the elbow.    Baseline  6/10 worst pain; 06/29/2019: 6/10    Time  6    Period  Weeks    Status  On-going      PT LONG TERM GOAL #4   Title  Patient will be able to ambulate over one mile to return to recreational activity of hiking.    Baseline  unable to amb over 160ft; 06/29/2019: 1081ft  Time  6    Period  Weeks    Status  On-going            Plan - 07/03/19 1434    Clinical Impression Statement  In general, patient demonstrating good tolerance to therapy session this date, reasonable accommodations are alllowed in-session to allow adequate rest between activities as needed. All interventional executed without any exacerbation of pain or other symptoms. Pt demonstrates focused motivation to fully participatein session. Pt is able to progress in certain interventions, although strength deficits remain obvious to author, as well as easy fatiguability.  Pt given intermittent multimodal cues to teach best possible form with exercises. Pt continues to make steady progress toward most goals. No home exercise updates made at this time.    Rehab Potential  Good    PT Frequency  2x / week    PT Duration  6 weeks    PT Treatment/Interventions  Iontophoresis 4mg /ml Dexamethasone;Moist Heat;Electrical Stimulation;Cryotherapy;Ultrasound;Neuromuscular re-education;Patient/family  education;Passive range of motion;Dry needling    PT Next Visit Plan  Progress strengthening and AROM    PT Home Exercise Plan  No updates this date.    Consulted and Agree with Plan of Care  Patient       Patient will benefit from skilled therapeutic intervention in order to improve the following deficits and impairments:  Pain, Increased muscle spasms, Increased fascial restricitons, Abnormal gait, Decreased balance, Decreased cognition, Decreased range of motion, Decreased coordination, Decreased strength, Decreased endurance, Decreased mobility, Decreased activity tolerance, Impaired sensation, Difficulty walking, Postural dysfunction  Visit Diagnosis: Acute pain of right knee  Stiffness of right knee, not elsewhere classified     Problem List Patient Active Problem List   Diagnosis Date Noted  . Syncope 02/03/2019  . New onset atrial fibrillation ([place]) 02/03/2019  . Arthritis 07/30/2017  . Chronic rhinitis 07/30/2017   3:13 PM, 07/03/19 Etta Grandchild, PT, DPT Physical Therapist - [place] [phone] (Office)    , C 07/03/2019, 2:36 PM  [facility] [address], [place], [address] Phone: [phone]   Fax:  [phone]  Name: Glenn Perez MRN: [id number] Date of Birth: 1946-04-09

## 2019-07-04 LAB — NOVEL CORONAVIRUS, NAA: SARS-CoV-2, NAA: NOT DETECTED

## 2019-07-05 ENCOUNTER — Telehealth: Payer: Self-pay

## 2019-07-05 NOTE — Telephone Encounter (Signed)
Patient returned call regarding his [place] lab results - DOB/Address verified -Negative results given, no further questions.

## 2019-07-06 ENCOUNTER — Other Ambulatory Visit: Payer: Self-pay

## 2019-07-06 ENCOUNTER — Ambulatory Visit: Payer: Medicare HMO

## 2019-07-06 DIAGNOSIS — M25561 Pain in right knee: Secondary | ICD-10-CM

## 2019-07-06 DIAGNOSIS — M25661 Stiffness of right knee, not elsewhere classified: Secondary | ICD-10-CM

## 2019-07-06 NOTE — Therapy (Signed)
[facility] [address], [place], [address] Phone: [phone]   Fax:  [phone]  Physical Therapy Treatment  Patient Details  Name: Glenn Perez MRN: [id number] Date of Birth: Feb 24, 1946 Referring Provider (PT): Aluisio MD   Encounter Date: 07/06/2019  PT End of Session - 07/06/19 1344    Visit Number  12    Number of Visits  13    Date for PT Re-Evaluation  06/29/19    PT Start Time  [id number]    PT Stop Time  1430    PT Time Calculation (min)  45 min    Activity Tolerance  No increased pain;Patient tolerated treatment well    Behavior During Therapy  [facility] for tasks assessed/performed       Past Medical History:  Diagnosis Date  . Syncope     Past Surgical History:  Procedure Laterality Date  . APPENDECTOMY    . KNEE SURGERY    . NOSE SURGERY     x 2.  . REPLACEMENT TOTAL KNEE    . SHOULDER SURGERY  10/2015   for partially torn rotator cuff and detached bicep tendon.    There were no vitals filed for this visit.  Subjective Assessment - 07/06/19 1343    Subjective  Patient reports going on bike. Initial soreness but "everything has been smoother". Has been doing exercises for knee. Dx'd with gout, currently medicated and reports that swelling is decreased. Pt is enthusiastically attending the gym with a variety of exercises.    Pertinent History  L TKA, previous shoulder surgery, A-Fib, syncope episdoes    Currently in Pain?  No/denies          TREATMENT  TE Heel slides x 20 for increased tissue perfusion and greater flexion Squats with LLE on step for increased flexion x 10 Squats with RLE on step for increased strength 2 x 10 HS curls 10# x 8 HS curls 5# 2 x 10 TE for strengthening and increased range at knee Knee flexion: 112 Knee ext: -8  NMR SLS: moderate instability L>R with increased use of hip strategy Tandem walk 6 lengths of 15 ft with HHA for  stability Sidestepping with red band 8 lengths of 15 ft  MT Patellar inferior/superior PAM hypomobile; improved with grade 2-3 glides Screw home tibial ER mobilization in prone with concurrent extension HS stretch x 30 sec Knee ext increased to -6  PLAN Examine L Great toe/ankle mobility for possible contribution to reduced functional mobility       PT Education - 07/06/19 1343    Education provided  Yes    Education Details  Form technique with exercise.    Person(s) Educated  Patient    Methods  Explanation;Demonstration;Tactile cues;Verbal cues    Comprehension  Verbalized understanding;Returned demonstration;Verbal cues required;Tactile cues required          PT Long Term Goals - 06/29/19 1255      PT LONG TERM GOAL #1   Title  Patient will be independent with HEP focused on improving elbow strengthening and coordination.     Baseline  dependent with HEP; 06/29/2019: independent with exercise    Time  6    Period  Weeks    Status  Achieved      PT LONG TERM GOAL #2   Title  Patient will improve his LEFS to over 64/80 to indicate significant improvement in LE function and better ability to walk.  Baseline  LEFS: 27/80; 06/29/2019: 44/80    Time  6    Period  Weeks    Status  On-going      PT LONG TERM GOAL #3   Title  Patient will have a worst pain of 2/10 to indicatine significant improvement in pain and spasms within the elbow.    Baseline  6/10 worst pain; 06/29/2019: 6/10    Time  6    Period  Weeks    Status  On-going      PT LONG TERM GOAL #4   Title  Patient will be able to ambulate over one mile to return to recreational activity of hiking.    Baseline  unable to amb over 122ft; 06/29/2019: 1070ft    Time  6    Period  Weeks    Status  On-going            Plan - 07/06/19 1435    Clinical Impression Statement  Patient shows consistent improvement in knee ROM achieving 112 deg today after activity; remains at -6 deg. ext with HS guarding,  possibly influenced by jt swelling secondary to gout; gains made after screw home mobilization. Patient shows mild instability in hips and compensatory lumbar extension likely related to decreased activity but is able to correct with cues. Patient will benefit from skilled physical therapy to gain full ROM and retun to PLOF.    Rehab Potential  Good    PT Frequency  2x / week    PT Duration  6 weeks    PT Treatment/Interventions  Iontophoresis 4mg /ml Dexamethasone;Moist Heat;Electrical Stimulation;Cryotherapy;Ultrasound;Neuromuscular re-education;Patient/family education;Passive range of motion;Dry needling    PT Next Visit Plan  Progress strengthening and AROM    PT Home Exercise Plan  No updates this date.    Consulted and Agree with Plan of Care  Patient       Patient will benefit from skilled therapeutic intervention in order to improve the following deficits and impairments:  Pain, Increased muscle spasms, Increased fascial restricitons, Abnormal gait, Decreased balance, Decreased cognition, Decreased range of motion, Decreased coordination, Decreased strength, Decreased endurance, Decreased mobility, Decreased activity tolerance, Impaired sensation, Difficulty walking, Postural dysfunction  Visit Diagnosis: Acute pain of right knee  Stiffness of right knee, not elsewhere classified     Problem List Patient Active Problem List   Diagnosis Date Noted  . Syncope 02/03/2019  . New onset atrial fibrillation ([place]) 02/03/2019  . Arthritis 07/30/2017  . Chronic rhinitis 07/30/2017    Glenn Perez, SPT 07/06/2019, 2:46 PM  [facility] [address] [place], [address] Phone: [phone]   Fax:  [phone]  Name: Glenn Perez MRN: [id number] Date of Birth: Dec 10, 1945

## 2019-07-10 ENCOUNTER — Other Ambulatory Visit: Payer: Self-pay

## 2019-07-10 ENCOUNTER — Ambulatory Visit: Payer: Medicare HMO

## 2019-07-10 DIAGNOSIS — M25561 Pain in right knee: Secondary | ICD-10-CM

## 2019-07-10 DIAGNOSIS — M25661 Stiffness of right knee, not elsewhere classified: Secondary | ICD-10-CM

## 2019-07-10 NOTE — Therapy (Signed)
[facility] [address] [place], [address] Phone: [phone]   Fax:  [phone]  Physical Therapy Treatment  Patient Details  Name: Glenn Perez MRN: [id number] Date of Birth: 1946/08/29 Referring Provider (PT): Aluisio MD   Encounter Date: 07/10/2019  PT End of Session - 07/10/19 1524    Visit Number  13    Number of Visits  13    Date for PT Re-Evaluation  08/03/19    PT Start Time  1435    PT Stop Time  1525    PT Time Calculation (min)  50 min    Activity Tolerance  No increased pain;Patient tolerated treatment well    Behavior During Therapy  [facility] for tasks assessed/performed       Past Medical History:  Diagnosis Date  . Syncope     Past Surgical History:  Procedure Laterality Date  . APPENDECTOMY    . KNEE SURGERY    . NOSE SURGERY     x 2.  . REPLACEMENT TOTAL KNEE    . SHOULDER SURGERY  10/2015   for partially torn rotator cuff and detached bicep tendon.    There were no vitals filed for this visit.  Subjective Assessment - 07/10/19 1435    Subjective  Patient is riding 30 min and lifting on leg press increasing weight.    Pertinent History  L TKA, previous shoulder surgery, A-Fib, syncope episdoes    Currently in Pain?  No/denies         TREATMENT  MT Patellar inferior/superior PAM hypomobile; improved with grade 2-3 glides Screw home tibial ER mobilization with pt in long sitting with concurrent extension Knee ext increased to -5 Ankle R demonstrates some edema with mild reduced PF compared to L but is not felt to be contributory Great toe L demonstrates mild hallux valgus with severely hypomobile 1st ray MTP; some crepitus noted on superolateral aspect with palpation; not felt to be contributory to remaining deficits  TE Squats x 10 with faded UE assist for increased strengthening and ROM Squats with RLE elevated 2 x 10 with faded UE assist   Glute med hip  hikes x 10 - discontinued due to compensation with knee extension influenced by instability in knee TKE black band 2 x 10 to increase extension Leg press 65# BLE concentric RLE eccentric 2 x 10 for increased strength in quads and increased flexion ROM Calf press 45# R/L Calf stretch gastric soleus 2 x 30 B  NMR Tandem stance foam 2 x 10 sec B for hip/ankle stability Tandem walk on foam 4 x 10 ft for hip/ankle stability Tandem walk on stable surface 4 x 10 for hip/ankle stability  Cryotherapy following exercise to reduce swelling and improve recovery. Patient advised to use ice, elevation, and hanging stretch to increase extension  PLAN: Evaluate SLS and progress to sports-specific exercises       PT Education - 07/10/19 1435    Education provided  Yes    Education Details  form technique with exercise; importance of icing for ext    Person(s) Educated  Patient    Methods  Demonstration;Tactile cues;Verbal cues;Explanation    Comprehension  Verbalized understanding;Returned demonstration;Verbal cues required;Tactile cues required          PT Long Term Goals - 06/29/19 1255      PT LONG TERM GOAL #1   Title  Patient will be independent with HEP focused on improving elbow strengthening  and coordination.     Baseline  dependent with HEP; 06/29/2019: independent with exercise    Time  6    Period  Weeks    Status  Achieved      PT LONG TERM GOAL #2   Title  Patient will improve his LEFS to over 64/80 to indicate significant improvement in LE function and better ability to walk.    Baseline  LEFS: 27/80; 06/29/2019: 44/80    Time  6    Period  Weeks    Status  On-going      PT LONG TERM GOAL #3   Title  Patient will have a worst pain of 2/10 to indicatine significant improvement in pain and spasms within the elbow.    Baseline  6/10 worst pain; 06/29/2019: 6/10    Time  6    Period  Weeks    Status  On-going      PT LONG TERM GOAL #4   Title  Patient will be able to  ambulate over one mile to return to recreational activity of hiking.    Baseline  unable to amb over 135ft; 06/29/2019: 1070ft    Time  6    Period  Weeks    Status  On-going            Plan - 07/10/19 1624    Clinical Impression Statement  Patient demonstrates improved patellar mobility following mobilizations but remains at -5 deg. ext. likely influenced by joint swelling. Patient advised to ice knee to reduce swelling in joint for incresaed ROM. Compensatory lumbar extension notably decreased since last visit with improvements in tandem stance. Patient demonstrates high motivation with gym attendance; plan to focus PT on neuromuscular re-education and return-to-sport progression with manual therapy for extension. Patient will benefit from skilled physical therapy to return to PLOF.    Rehab Potential  Good    PT Frequency  2x / week    PT Duration  6 weeks    PT Treatment/Interventions  Iontophoresis 4mg /ml Dexamethasone;Moist Heat;Electrical Stimulation;Cryotherapy;Ultrasound;Neuromuscular re-education;Patient/family education;Passive range of motion;Dry needling    PT Next Visit Plan  Progress strengthening and AROM    PT Home Exercise Plan  No updates this date.    Consulted and Agree with Plan of Care  Patient       Patient will benefit from skilled therapeutic intervention in order to improve the following deficits and impairments:  Pain, Increased muscle spasms, Increased fascial restricitons, Abnormal gait, Decreased balance, Decreased cognition, Decreased range of motion, Decreased coordination, Decreased strength, Decreased endurance, Decreased mobility, Decreased activity tolerance, Impaired sensation, Difficulty walking, Postural dysfunction  Visit Diagnosis: Acute pain of right knee  Stiffness of right knee, not elsewhere classified     Problem List Patient Active Problem List   Diagnosis Date Noted  . Syncope 02/03/2019  . New onset atrial fibrillation ([place])  02/03/2019  . Arthritis 07/30/2017  . Chronic rhinitis 07/30/2017    Virgia Land, SPT 07/10/2019, 4:26 PM  [facility] [address] [place], [address] Phone: [phone]   Fax:  [phone]  Name: Glenn Perez MRN: [id number] Date of Birth: [birth year]

## 2019-07-13 ENCOUNTER — Ambulatory Visit: Payer: Medicare HMO

## 2019-07-13 ENCOUNTER — Other Ambulatory Visit: Payer: Self-pay

## 2019-07-13 DIAGNOSIS — M25661 Stiffness of right knee, not elsewhere classified: Secondary | ICD-10-CM | POA: Diagnosis not present

## 2019-07-13 DIAGNOSIS — M25561 Pain in right knee: Secondary | ICD-10-CM

## 2019-07-13 NOTE — Therapy (Cosign Needed)
[place] [facility] [address] [place], [address] Phone: [phone]   Fax:  [phone]  Physical Therapy Treatment  Patient Details  Name: Glenn Perez MRN: [id number] Date of Birth: Jun 14, 1946 Referring Provider (PT): Aluisio MD   Encounter Date: 07/13/2019  PT End of Session - 07/13/19 1454    Visit Number  14    Number of Visits  13    Date for PT Re-Evaluation  08/03/19    PT Start Time  [id number]    PT Stop Time  1430    PT Time Calculation (min)  41 min    Activity Tolerance  No increased pain;Patient tolerated treatment well    Behavior During Therapy  [facility] for tasks assessed/performed       Past Medical History:  Diagnosis Date  . Syncope     Past Surgical History:  Procedure Laterality Date  . APPENDECTOMY    . KNEE SURGERY    . NOSE SURGERY     x 2.  . REPLACEMENT TOTAL KNEE    . SHOULDER SURGERY  10/2015   for partially torn rotator cuff and detached bicep tendon.    There were no vitals filed for this visit.  Subjective Assessment - 07/13/19 1348    Subjective  Patient reports working hard in gym to increase strength with leg. Taking vacation next week.    Pertinent History  L TKA, previous shoulder surgery, A-Fib, syncope episdoes    Currently in Pain?  No/denies        TE Education on TERT stretching  Tandem walks 2 x 10 ft - patient demonstrates significant improvement relative to last week Tandem walks bwkd 4 x 10 ft Monster walks green TB 6 x 10 lengths fwd/bkwd cues for target muscle activation Lateral steps green TB 4 x 10 lengths cues for torso for improved hip target muscle activation Ball bridges wide narrow SL R/L x 10 each Donkey kicks B 2 x10 TRX squats 2 x 10 TRX SLS squats over R x 10    NMR SLS on noncompliant surface - patient demonstrates good stability SLS on foam 3 x 10 B  SLS on foam 1 x 10 B with UE perturbations BOSU step ups onto dome x 10 for  balanece BOSU stands with UE perturbations for stability     PT Long Term Goals - 06/29/19 1255      PT LONG TERM GOAL #1   Title  Patient will be independent with HEP focused on improving elbow strengthening and coordination.     Baseline  dependent with HEP; 06/29/2019: independent with exercise    Time  6    Period  Weeks    Status  Achieved      PT LONG TERM GOAL #2   Title  Patient will improve his LEFS to over 64/80 to indicate significant improvement in LE function and better ability to walk.    Baseline  LEFS: 27/80; 06/29/2019: 44/80    Time  6    Period  Weeks    Status  On-going      PT LONG TERM GOAL #3   Title  Patient will have a worst pain of 2/10 to indicatine significant improvement in pain and spasms within the elbow.    Baseline  6/10 worst pain; 06/29/2019: 6/10    Time  6    Period  Weeks    Status  On-going      PT  LONG TERM GOAL #4   Title  Patient will be able to ambulate over one mile to return to recreational activity of hiking.    Baseline  unable to amb over 1107ft; 06/29/2019: 1044ft    Time  6    Period  Weeks    Status  On-going            Plan - 07/13/19 1455    Clinical Impression Statement  Treatment today focused on education for HEP and gym since patient will be away for a week. Patient expressed comprehension of TERT stretching program and exercises for HEP and gym. Demonstrates improved stability with RLE approaching or equal to LLE with challenging balance exercises. Patient will benefit from skilled physical therapy to return to PLOF.    Rehab Potential  Good    PT Frequency  2x / week    PT Duration  6 weeks    PT Treatment/Interventions  Iontophoresis 4mg /ml Dexamethasone;Moist Heat;Electrical Stimulation;Cryotherapy;Ultrasound;Neuromuscular re-education;Patient/family education;Passive range of motion;Dry needling    PT Next Visit Plan  Progress strengthening and AROM    PT Home Exercise Plan  SLS on foam mat, ball bridges     Consulted and Agree with Plan of Care  Patient       Patient will benefit from skilled therapeutic intervention in order to improve the following deficits and impairments:  Pain, Increased muscle spasms, Increased fascial restricitons, Abnormal gait, Decreased balance, Decreased cognition, Decreased range of motion, Decreased coordination, Decreased strength, Decreased endurance, Decreased mobility, Decreased activity tolerance, Impaired sensation, Difficulty walking, Postural dysfunction  Visit Diagnosis: Acute pain of right knee  Stiffness of right knee, not elsewhere classified     Problem List Patient Active Problem List   Diagnosis Date Noted  . Syncope 02/03/2019  . New onset atrial fibrillation ([place]) 02/03/2019  . Arthritis 07/30/2017  . Chronic rhinitis 07/30/2017    Virgia Land, SPT 07/13/2019, 2:58 PM   Session supervised by licensed PT. Milana Na Porfilio, MPT [place] [facility] [address], [place], [address] Phone: [phone]   Fax:  [phone]  Name: Glenn Perez MRN: [id number] Date of Birth: Aug 08, 1946

## 2019-07-24 ENCOUNTER — Other Ambulatory Visit: Payer: Self-pay

## 2019-07-24 ENCOUNTER — Ambulatory Visit: Payer: Medicare HMO | Attending: Orthopedic Surgery

## 2019-07-24 DIAGNOSIS — M25561 Pain in right knee: Secondary | ICD-10-CM | POA: Diagnosis not present

## 2019-07-24 DIAGNOSIS — M25661 Stiffness of right knee, not elsewhere classified: Secondary | ICD-10-CM | POA: Insufficient documentation

## 2019-07-24 NOTE — Therapy (Signed)
[place] [facility] [address] [place], [address] Phone: [phone]   Fax:  [phone]  Physical Therapy Treatment  Patient Details  Name: Glenn Perez MRN: [id number] Date of Birth: 11/07/1945 Referring Provider (PT): Aluisio MD   Encounter Date: 07/24/2019  PT End of Session - 07/24/19 1416    Visit Number  15    Number of Visits  13    Date for PT Re-Evaluation  08/03/19    PT Start Time  1115    PT Stop Time  1200    PT Time Calculation (min)  45 min    Activity Tolerance  No increased pain;Patient tolerated treatment well    Behavior During Therapy  [facility] for tasks assessed/performed       Past Medical History:  Diagnosis Date  . Syncope     Past Surgical History:  Procedure Laterality Date  . APPENDECTOMY    . KNEE SURGERY    . NOSE SURGERY     x 2.  . REPLACEMENT TOTAL KNEE    . SHOULDER SURGERY  10/2015   for partially torn rotator cuff and detached bicep tendon.    There were no vitals filed for this visit.  Subjective Assessment - 07/24/19 1115    Subjective  Patient reports 700 mile drive to visit mother over last week. Little stiff but able to ride bike. Performing TERT as able.    Pertinent History  L TKA, previous shoulder surgery, A-Fib, syncope episdoes    Currently in Pain?  No/denies       TREATMENT  TE Deadlifts 10# 2 x 10, 25# 2 x 10 for increased knee extension, HS strengthening Goblet squat 25# 2 x 10 mild compensation to LLE corrected with cues  NMR Tandem walk forward 2 x 10 ft - patient demonstrates improvement in dynamic postural control relative to prior session Tandem walk backward 4 x 10 ft SLS on foam x 10 sec hold - patient demonstrates mod instability R>L but no CGA required  Ball toss on compliant surface with moving target requiring truncal rotation, reach outside BOS, feedforward/feedback control x 30 tosses Ball toss with lateral stepping on  complaint surface Weight shift into BOSU dome R/L x 10 with TTWB on contralateral LE for stability BOSU platform quiet standing x 30 sec BOSU platform squats 2 x 10 to encourage equal reliance on RLE with ankle/hip strategies BOSU platform lateral wt shift, saggital wt shift x 10 to encourage equal reliance on RLE with ankle/hip strategies  Post session: Knee ext 4 deg. Flex 112.     PT Education - 07/24/19 1114    Education provided  Yes    Education Details  form/technique with exercise; importance of regaining full knee ext for joint protection    Person(s) Educated  Patient    Methods  Explanation;Demonstration;Tactile cues;Verbal cues    Comprehension  Verbalized understanding;Returned demonstration;Verbal cues required;Tactile cues required          PT Long Term Goals - 07/24/19 1423      PT LONG TERM GOAL #1   Title  Patient will be independent with HEP focused on improving elbow strengthening and coordination.     Baseline  dependent with HEP; 06/29/2019: independent with exercise    Time  6    Period  Weeks    Status  Achieved      PT LONG TERM GOAL #2   Title  Patient will improve his LEFS  to over 64/80 to indicate significant improvement in LE function and better ability to walk.    Baseline  LEFS: 27/80; 06/29/2019: 44/80; 11/02 deferred to NV    Time  6    Period  Weeks    Status  Deferred      PT LONG TERM GOAL #3   Title  Patient will have a worst pain of 2/10 to indicatine significant improvement in pain and spasms within the elbow.    Baseline  6/10 worst pain; 06/29/2019: 6/10; 07/24/2019 0/10, pain only with leg curls    Time  6    Period  Weeks    Status  Achieved      PT LONG TERM GOAL #4   Title  Patient will be able to ambulate over one mile to return to recreational activity of hiking.    Baseline  unable to amb over 131ft; 06/29/2019: 1085ft; 11/02 2.75 miles    Time  6    Period  Weeks    Status  Achieved      PT LONG TERM GOAL #5    Title  Patient will demonstrate knee ROM flexion 120 ext -3 to allow for normal knee arthrokinematics with ADLs and sport participation.    Baseline  Knee flexion 112 extension -4    Time  4    Period  Weeks    Status  New    Target Date  08/21/19      Additional Long Term Goals   Additional Long Term Goals  Yes      PT LONG TERM GOAL #6   Title  Patient will report ability to play doubles tennis x 30 min without increase in pain to demonstrate improved functional capacity with leisure activities.    Baseline  Cannot play tennis    Time  4    Period  Weeks    Status  New    Target Date  08/21/19            Plan - 07/24/19 1417    Clinical Impression Statement  Patient tolerated challenging balance exercises well incorporating truncal rotation, dynamic stability, and feedforward/feedback control for safe return to tennis. Mild but promising gains noted in knee ext; patient educated regarding importance of regaining knee ext for normal knee biomechanics and joint protection and expresses good comprehension. Deficits remain in hip girdle stability representing a risk to returning to tennis. Patient will benefit from skilled physical therapy to return to PLOF.    Rehab Potential  Good    PT Frequency  2x / week    PT Duration  6 weeks    PT Treatment/Interventions  Iontophoresis 4mg /ml Dexamethasone;Moist Heat;Electrical Stimulation;Cryotherapy;Ultrasound;Neuromuscular re-education;Patient/family education;Passive range of motion;Dry needling    PT Next Visit Plan  Progress strengthening and AROM    PT Home Exercise Plan  SLS on foam mat, ball bridges    Consulted and Agree with Plan of Care  Patient       Patient will benefit from skilled therapeutic intervention in order to improve the following deficits and impairments:  Pain, Increased muscle spasms, Increased fascial restricitons, Abnormal gait, Decreased balance, Decreased cognition, Decreased range of motion, Decreased  coordination, Decreased strength, Decreased endurance, Decreased mobility, Decreased activity tolerance, Impaired sensation, Difficulty walking, Postural dysfunction  Visit Diagnosis: Acute pain of right knee  Stiffness of right knee, not elsewhere classified     Problem List Patient Active Problem List   Diagnosis Date Noted  . Syncope 02/03/2019  .  New onset atrial fibrillation ([place]) 02/03/2019  . Arthritis 07/30/2017  . Chronic rhinitis 07/30/2017    Virgia Land, SPT 07/24/2019, 2:34 PM  [place] [facility] [address], [place], [address] Phone: [phone]   Fax:  [phone]  Name: KALETH JACKS MRN: [id number] Date of Birth: 1946-02-29

## 2019-07-27 ENCOUNTER — Ambulatory Visit: Payer: Medicare HMO

## 2019-07-27 ENCOUNTER — Other Ambulatory Visit: Payer: Self-pay

## 2019-07-27 DIAGNOSIS — M25561 Pain in right knee: Secondary | ICD-10-CM

## 2019-07-27 DIAGNOSIS — M25661 Stiffness of right knee, not elsewhere classified: Secondary | ICD-10-CM | POA: Diagnosis not present

## 2019-07-27 NOTE — Therapy (Signed)
[place] [facility] [address], [place], [address] Phone: [phone]   Fax:  [phone]  Physical Therapy Treatment  Patient Details  Name: Glenn Perez MRN: [id number] Date of Birth: Feb 05, 1946 Referring Provider (PT): Aluisio MD   Encounter Date: 07/27/2019  PT End of Session - 07/27/19 1128    Visit Number  16    Number of Visits  21    Date for PT Re-Evaluation  08/03/19    Authorization Type  6 / 10    PT Start Time  1119    PT Stop Time  1200    PT Time Calculation (min)  41 min    Activity Tolerance  No increased pain;Patient tolerated treatment well    Behavior During Therapy  [facility] for tasks assessed/performed       Past Medical History:  Diagnosis Date  . Syncope     Past Surgical History:  Procedure Laterality Date  . APPENDECTOMY    . KNEE SURGERY    . NOSE SURGERY     x 2.  . REPLACEMENT TOTAL KNEE    . SHOULDER SURGERY  10/2015   for partially torn rotator cuff and detached bicep tendon.    There were no vitals filed for this visit.  Subjective Assessment - 07/27/19 1127    Subjective  Patient reports hamstrings sore after last session but have recovered well.    Pertinent History  L TKA, previous shoulder surgery, A-Fib, syncope episdoes    Currently in Pain?  No/denies       TREATMENT TE Trx squats x 10  BUE support at bar with R ankle hops 3 x 10 Sidestepping with black band 4 x 10 ft TE for increased strength, power BLE through full range of motion with functional activities  NMR BOSU dome step ups with floating leg 2 x 10 R/L BOSU dome SLS 2 x 10 sec R/L BOSU platform squats x 10 BOSU ankle DF/PF, inv/ev x 10 Tandem walk backwards 6 x 10 ft Sidestepping on foam with ball toss - deferred Sidestepping with ball toss and thoracic rotation with increased tempo x 20 tosses Square shuffle forward/back/lateral with increased tempo 15 ft square x 5 cw/ccw - note  reduced tempo going backwards, hesitation during cutting/directional changes  Agility latter: Quick step: In-in-out-out 4 x 20 ft In-in-out 4 x 20 ft Diagonal patterns 4 x 20 ft Hops: Ankle hops B 1 x 20 ft - shows asymmetric loading L>R Ankle hops R - patient cannot perform  NMR for improved static and dynamic stability with functional movements for return to leisure activities     PT Education - 07/27/19 1128    Education provided  Yes    Education Details  form/technique with exercise    Person(s) Educated  Patient    Methods  Explanation;Demonstration;Tactile cues;Verbal cues    Comprehension  Verbalized understanding;Returned demonstration;Verbal cues required;Tactile cues required          PT Long Term Goals - 07/24/19 1423      PT LONG TERM GOAL #1   Title  Patient will be independent with HEP focused on improving elbow strengthening and coordination.     Baseline  dependent with HEP; 06/29/2019: independent with exercise    Time  6    Period  Weeks    Status  Achieved      PT LONG TERM GOAL #2   Title  Patient will improve his LEFS to  over 64/80 to indicate significant improvement in LE function and better ability to walk.    Baseline  LEFS: 27/80; 06/29/2019: 44/80; 11/02 deferred to NV    Time  6    Period  Weeks    Status  Deferred      PT LONG TERM GOAL #3   Title  Patient will have a worst pain of 2/10 to indicatine significant improvement in pain and spasms within the elbow.    Baseline  6/10 worst pain; 06/29/2019: 6/10; 07/24/2019 0/10, pain only with leg curls    Time  6    Period  Weeks    Status  Achieved      PT LONG TERM GOAL #4   Title  Patient will be able to ambulate over one mile to return to recreational activity of hiking.    Baseline  unable to amb over 169ft; 06/29/2019: 1026ft; 11/02 2.75 miles    Time  6    Period  Weeks    Status  Achieved      PT LONG TERM GOAL #5   Title  Patient will demonstrate knee ROM flexion 120 ext -3  to allow for normal knee arthrokinematics with ADLs and sport participation.    Baseline  Knee flexion 112 extension -4    Time  4    Period  Weeks    Status  New    Target Date  08/21/19      Additional Long Term Goals   Additional Long Term Goals  Yes      PT LONG TERM GOAL #6   Title  Patient will report ability to play doubles tennis x 30 min without increase in pain to demonstrate improved functional capacity with leisure activities.    Baseline  Cannot play tennis    Time  4    Period  Weeks    Status  New    Target Date  08/21/19            Plan - 07/27/19 1255    Clinical Impression Statement  Patient demonstrates marked gains with challenging SLS balance exercises. Deficits noted in power, agility, and dynamic postural control with ankle hops/jumps and shuffles, especially moving backwards. Patient is active in gym practicing PT exercises with good retention and gains; plan to reduce to 1/wk focusing on gym program. Patient will benefit from skilled physical therapy to return to PLOF.    Rehab Potential  Good    PT Frequency  1x / week    PT Duration  4 weeks    PT Treatment/Interventions  Iontophoresis 4mg /ml Dexamethasone;Moist Heat;Electrical Stimulation;Cryotherapy;Ultrasound;Neuromuscular re-education;Patient/family education;Passive range of motion;Dry needling    PT Next Visit Plan  Progress strengthening and AROM    PT Home Exercise Plan  SLS on foam mat, ball bridges    Consulted and Agree with Plan of Care  Patient       Patient will benefit from skilled therapeutic intervention in order to improve the following deficits and impairments:  Pain, Increased muscle spasms, Increased fascial restricitons, Abnormal gait, Decreased balance, Decreased cognition, Decreased range of motion, Decreased coordination, Decreased strength, Decreased endurance, Decreased mobility, Decreased activity tolerance, Impaired sensation, Difficulty walking, Postural  dysfunction  Visit Diagnosis: Acute pain of right knee  Stiffness of right knee, not elsewhere classified     Problem List Patient Active Problem List   Diagnosis Date Noted  . Syncope 02/03/2019  . New onset atrial fibrillation ([place]) 02/03/2019  . Arthritis 07/30/2017  .  Chronic rhinitis 07/30/2017    Virgia Land, SPT 07/27/2019, 1:59 PM  [place] [facility] [address], [place], [address] Phone: [phone]   Fax:  [phone]  Name: SHAMS DOZOIS MRN: [id number] Date of Birth: 10-Jan-1946

## 2019-07-31 ENCOUNTER — Ambulatory Visit: Payer: Medicare HMO

## 2019-08-03 ENCOUNTER — Other Ambulatory Visit: Payer: Self-pay

## 2019-08-03 ENCOUNTER — Ambulatory Visit: Payer: Medicare HMO

## 2019-08-03 DIAGNOSIS — M25561 Pain in right knee: Secondary | ICD-10-CM

## 2019-08-03 DIAGNOSIS — M25661 Stiffness of right knee, not elsewhere classified: Secondary | ICD-10-CM

## 2019-08-03 NOTE — Therapy (Signed)
[place] [facility] [address], [place], [address] Phone: [phone]   Fax:  [phone]  Physical Therapy Treatment  Patient Details  Name: Glenn Perez MRN: [id number] Date of Birth: 1946-07-28 Referring Provider (PT): Aluisio MD   Encounter Date: 08/03/2019  PT End of Session - 08/03/19 1149    Visit Number  17    Number of Visits  21    Date for PT Re-Evaluation  08/03/19    Authorization Type  7 / 10    PT Start Time  1119    PT Stop Time  1200    PT Time Calculation (min)  41 min    Activity Tolerance  No increased pain;Patient tolerated treatment well    Behavior During Therapy  [facility] for tasks assessed/performed       Past Medical History:  Diagnosis Date  . Syncope     Past Surgical History:  Procedure Laterality Date  . APPENDECTOMY    . KNEE SURGERY    . NOSE SURGERY     x 2.  . REPLACEMENT TOTAL KNEE    . SHOULDER SURGERY  10/2015   for partially torn rotator cuff and detached bicep tendon.    There were no vitals filed for this visit.  Subjective Assessment - 08/03/19 1120    Subjective  Patient reports good report from MD. Going to gym with good results.    Pertinent History  L TKA, previous shoulder surgery, A-Fib, syncope episdoes    Limitations  Lifting    Currently in Pain?  No/denies        TE TRX squats  X 20  Ankle hops R/L 2 x 10 Alfredson heel raises R x 20 L x 10 Calf stretch x 30 sec BLE jump landings x 20 note minor favoring of LLE Deadlifts 20# x 20 with cues for avoiding lumbar flexion Lateral step ups onto 8" step for eccentric control and concentric power in RLE 2 x 15  Education on gym program progression for RTS  Goal assessment: Flexion 112 Ext -4 LEFS: 58/80 Patient reports he hasn't been on tennis court yet  PT Education - 08/03/19 1121    Education provided  Yes    Education Details  form/technique with exercise; exercises for gym program;  general RTS progression    Person(s) Educated  Patient    Methods  Explanation;Demonstration;Verbal cues    Comprehension  Verbalized understanding;Returned demonstration;Verbal cues required          PT Long Term Goals - 08/03/19 1121      PT LONG TERM GOAL #1   Title  Patient will be independent with HEP focused on improving elbow strengthening and coordination.     Baseline  dependent with HEP; 06/29/2019: independent with exercise    Time  6    Period  Weeks    Status  Achieved      PT LONG TERM GOAL #2   Title  Patient will improve his LEFS to over 64/80 to indicate significant improvement in LE function and better ability to walk.    Baseline  LEFS: 27/80; 06/29/2019: 44/80; 11/02 deferred to NV; 08/03/2019 58/80    Time  6    Period  Weeks    Status  Partially Met      PT LONG TERM GOAL #3   Title  Patient will have a worst pain of 2/10 to indicatine significant improvement in pain and spasms within the  elbow.    Baseline  6/10 worst pain; 06/29/2019: 6/10; 07/24/2019 0/10, pain only with leg curls    Time  6    Period  Weeks    Status  Achieved      PT LONG TERM GOAL #4   Title  Patient will be able to ambulate over one mile to return to recreational activity of hiking.    Baseline  unable to amb over 181f; 06/29/2019: 10042f 11/02 2.75 miles    Time  6    Period  Weeks    Status  Achieved      PT LONG TERM GOAL #5   Title  Patient will demonstrate knee ROM flexion 120 ext -3 to allow for normal knee arthrokinematics with ADLs and sport participation.    Baseline  Knee flexion 112 extension -4; 11/12 knee flexion 112 extension -4    Time  4    Period  Weeks    Status  Not Met      PT LONG TERM GOAL #6   Title  Patient will report ability to play doubles tennis x 30 min without increase in pain to demonstrate improved functional capacity with leisure activities.    Baseline  Cannot play tennis    Time  4    Period  Weeks    Status  Not Met             Plan - 08/03/19 1224    Clinical Impression Statement  Patient demonstrates mild gains in deadlift mechanics, calf strength, and quad eccentric control but deficits remain in power and agility with residual deficits in knee ROM. Patient feels he has made good progress and wishes to continue with his rehab independently at gym. Extensive education provided concerning appropriate progression for strengthening and RTS. Patient will d/c to gym program.    Personal Factors and Comorbidities  Age;Comorbidity 2    Rehab Potential  Good    PT Frequency  1x / week    PT Duration  4 weeks    PT Treatment/Interventions  Iontophoresis 50m66ml Dexamethasone;Moist Heat;Electrical Stimulation;Cryotherapy;Ultrasound;Neuromuscular re-education;Patient/family education;Passive range of motion;Dry needling    PT Next Visit Plan  N/A    PT Home Exercise Plan  gym program including step up exercises, leg press, deadlifts    Consulted and Agree with Plan of Care  Patient       Patient will benefit from skilled therapeutic intervention in order to improve the following deficits and impairments:  Pain, Increased muscle spasms, Increased fascial restricitons, Abnormal gait, Decreased balance, Decreased cognition, Decreased range of motion, Decreased coordination, Decreased strength, Decreased endurance, Decreased mobility, Decreased activity tolerance, Impaired sensation, Difficulty walking, Postural dysfunction  Visit Diagnosis: Acute pain of right knee  Stiffness of right knee, not elsewhere classified     Problem List Patient Active Problem List   Diagnosis Date Noted  . Syncope 02/03/2019  . [place]2020  . Arthritis 07/30/2017  . Chronic rhinitis 07/30/2017    RorVirgia LandPT 08/03/2019, 12:31 PM  [facility] [address],[place]one: [phone]:   [phone]: RanHIROYUKI OZANICHN: [id number] of Birth: [birth year]

## 2019-08-28 DIAGNOSIS — M109 Gout, unspecified: Secondary | ICD-10-CM | POA: Diagnosis not present

## 2019-08-30 DIAGNOSIS — R69 Illness, unspecified: Secondary | ICD-10-CM | POA: Diagnosis not present

## 2019-09-25 DIAGNOSIS — R69 Illness, unspecified: Secondary | ICD-10-CM | POA: Diagnosis not present

## 2019-09-28 DIAGNOSIS — R69 Illness, unspecified: Secondary | ICD-10-CM | POA: Diagnosis not present

## 2019-10-19 DIAGNOSIS — L570 Actinic keratosis: Secondary | ICD-10-CM | POA: Diagnosis not present

## 2019-10-19 DIAGNOSIS — X32XXXA Exposure to sunlight, initial encounter: Secondary | ICD-10-CM | POA: Diagnosis not present

## 2019-10-19 DIAGNOSIS — L821 Other seborrheic keratosis: Secondary | ICD-10-CM | POA: Diagnosis not present

## 2019-10-19 DIAGNOSIS — D2261 Melanocytic nevi of right upper limb, including shoulder: Secondary | ICD-10-CM | POA: Diagnosis not present

## 2019-10-19 DIAGNOSIS — D2271 Melanocytic nevi of right lower limb, including hip: Secondary | ICD-10-CM | POA: Diagnosis not present

## 2019-10-19 DIAGNOSIS — D2262 Melanocytic nevi of left upper limb, including shoulder: Secondary | ICD-10-CM | POA: Diagnosis not present

## 2019-10-19 DIAGNOSIS — D225 Melanocytic nevi of trunk: Secondary | ICD-10-CM | POA: Diagnosis not present

## 2019-10-19 DIAGNOSIS — D2272 Melanocytic nevi of left lower limb, including hip: Secondary | ICD-10-CM | POA: Diagnosis not present

## 2020-02-15 DIAGNOSIS — R030 Elevated blood-pressure reading, without diagnosis of hypertension: Secondary | ICD-10-CM | POA: Diagnosis not present

## 2020-02-15 DIAGNOSIS — M109 Gout, unspecified: Secondary | ICD-10-CM | POA: Diagnosis not present

## 2020-03-07 DIAGNOSIS — H2512 Age-related nuclear cataract, left eye: Secondary | ICD-10-CM | POA: Diagnosis not present

## 2020-03-07 DIAGNOSIS — H52223 Regular astigmatism, bilateral: Secondary | ICD-10-CM | POA: Diagnosis not present

## 2020-03-07 DIAGNOSIS — Z9841 Cataract extraction status, right eye: Secondary | ICD-10-CM | POA: Diagnosis not present

## 2020-03-07 DIAGNOSIS — H5203 Hypermetropia, bilateral: Secondary | ICD-10-CM | POA: Diagnosis not present

## 2020-03-07 DIAGNOSIS — H524 Presbyopia: Secondary | ICD-10-CM | POA: Diagnosis not present

## 2020-04-30 DIAGNOSIS — Z03818 Encounter for observation for suspected exposure to other biological agents ruled out: Secondary | ICD-10-CM | POA: Diagnosis not present

## 2020-04-30 DIAGNOSIS — Z1152 Encounter for screening for COVID-19: Secondary | ICD-10-CM | POA: Diagnosis not present

## 2020-05-02 DIAGNOSIS — J019 Acute sinusitis, unspecified: Secondary | ICD-10-CM | POA: Diagnosis not present

## 2020-07-10 DIAGNOSIS — S46012A Strain of muscle(s) and tendon(s) of the rotator cuff of left shoulder, initial encounter: Secondary | ICD-10-CM | POA: Diagnosis not present

## 2020-07-13 DIAGNOSIS — J0100 Acute maxillary sinusitis, unspecified: Secondary | ICD-10-CM | POA: Diagnosis not present

## 2020-07-13 DIAGNOSIS — Z03818 Encounter for observation for suspected exposure to other biological agents ruled out: Secondary | ICD-10-CM | POA: Diagnosis not present

## 2020-08-24 DIAGNOSIS — Z23 Encounter for immunization: Secondary | ICD-10-CM | POA: Diagnosis not present

## 2021-02-18 ENCOUNTER — Ambulatory Visit: Payer: Self-pay | Admitting: *Deleted

## 2021-02-18 DIAGNOSIS — U071 COVID-19: Secondary | ICD-10-CM

## 2021-02-18 NOTE — Addendum Note (Signed)
Addended by: Birdie Sons on: 02/18/2021 12:38 PM   Modules accepted: Orders

## 2021-02-18 NOTE — Telephone Encounter (Signed)
Per initial encounter, "Patients wife called in stating advice to were they can find a [facility] in [place]. Patient did test positive for Covid a few days ago, and wife believes it will help, since she did the same thing prior"; contacted pt's wife to discuss; she states he tested positive home test 02/15/21; he is having sinus congestion, temp 101.7 on 02/15/21, temp 97.6 on 5/31; runny nose, headache, body aches, fatigue; recommendations made per nurse triage protocol; the pt is normally seen by Dr Miguel Aschoff at [facility]; there is no availability today; his was was also advised the pt can be evaluated via a [facility]; she verbalized understanding and would like for this to be sent to the office for provider review; they can be contacted at [phone]; will route to office for provider review. Reason for Disposition . [1] HIGH RISK for severe COVID complications (e.g., weak immune system, age > 96 years, obesity with BMI > 25, pregnant, chronic lung disease or other chronic medical condition) AND [2] COVID symptoms (e.g., cough, fever)  (Exceptions: Already seen by PCP and no new or worsening symptoms.)  Answer Assessment - Initial Assessment Questions 1. COVID-19 DIAGNOSIS: "Who made your COVID-19 diagnosis?" "Was it confirmed by a positive lab test or self-test?" If not diagnosed by a doctor (or NP/PA), ask "Are there lots of cases (community spread) where you live?" Note: See public health department website, if unsure. Home test 02/16/20 2. COVID-19 EXPOSURE: "Was there any known exposure to COVID before the symptoms began?" CDC Definition of close contact: within 6 feet (2 meters) for a total of 15 minutes or more over a 24-hour period.      Wife positive covid 3. ONSET: "When did the COVID-19 symptoms start?"     02/16/20 4. WORST SYMPTOM: "What is your worst symptom?" (e.g., cough, fever, shortness of breath, muscle aches)    "tremendous sinus  congestion 5. COUGH: "Do you have a cough?" If Yes, ask: "How bad is the cough?"       6. FEVER: "Do you have a fever?" If Yes, ask: "What is your temperature, how was it measured, and when did it start?"     ?101.7 02/15/21 7. RESPIRATORY STATUS: "Describe your breathing?" (e.g., shortness of breath, wheezing, unable to speak)      8. BETTER-SAME-WORSE: "Are you getting better, staying the same or getting worse compared to yesterday?"  If getting worse, ask, "In what way?"    9. HIGH RISK DISEASE: "Do you have any chronic medical problems?" (e.g., asthma, heart or lung disease, weak immune system, obesity, etc.)    75 years old 36. VACCINE: "Have you had the COVID-19 vaccine?" If Yes, ask: "Which one, how many shots, when did you get it?"       Moderna 08/24/20 11. BOOSTER: "Have you received your COVID-19 booster?" If Yes, ask: "Which one and when did you get it?"       12. PREGNANCY: "Is there any chance you are pregnant?" "When was your last menstrual period?"       n/a 13. OTHER SYMPTOMS: "Do you have any other symptoms?"  (e.g., chills, fatigue, headache, loss of smell or taste, muscle pain, sore throat)     Body aches, fatigue 14. O2 SATURATION MONITOR:  "Do you use an oxygen saturation monitor (pulse oximeter) at home?" If Yes, ask "What is your reading (oxygen level) today?" "What is your usual oxygen saturation reading?" (e.g., 95%)  Protocols used: CORONAVIRUS (COVID-19) DIAGNOSED  OR SUSPECTED-A-AH

## 2021-02-18 NOTE — Telephone Encounter (Signed)
May enter order to refer to Covid treatment clinic, but need answers to vaccine history completed.

## 2021-02-18 NOTE — Addendum Note (Signed)
Addended by: Randal Buba on: 02/18/2021 01:57 PM   Modules accepted: Orders

## 2021-02-18 NOTE — Telephone Encounter (Signed)
Referral entered and signed off on. Patient advised.

## 2021-02-19 ENCOUNTER — Telehealth: Payer: Self-pay

## 2021-02-19 ENCOUNTER — Other Ambulatory Visit: Payer: Self-pay | Admitting: Physician Assistant

## 2021-02-19 DIAGNOSIS — U071 COVID-19: Secondary | ICD-10-CM | POA: Diagnosis not present

## 2021-02-19 MED ORDER — MOLNUPIRAVIR EUA 200MG CAPSULE: 4.0000 | ORAL_CAPSULE | Freq: Two times a day (BID) | ORAL | 0 refills | Status: AC

## 2021-02-19 NOTE — Telephone Encounter (Signed)
Called to discuss with patient about COVID-19 symptoms and the use of one of the available treatments for those with mild to moderate Covid symptoms and at a high risk of hospitalization.  Pt appears to qualify for outpatient treatment due to co-morbid conditions and/or a member of an at-risk group in accordance with the FDA Emergency Use Authorization.    Symptom onset: 02/15/21 Sinus,fever,body aches,headache Vaccinated: Yes Booster? Yes Immunocompromised? No Qualifiers: Age NIH Criteria: Tier 1  Pt. Would like to speak with APP.  Glenn Perez

## 2021-02-19 NOTE — Progress Notes (Signed)
Outpatient Oral COVID Treatment Note  I connected with Glenn Perez on 02/19/2021/10:46 AM by telephone and verified that I am speaking with the correct person using two identifiers.  I discussed the limitations, risks, security, and privacy concerns of performing an evaluation and management service by telephone and the availability of in person appointments. I also discussed with the patient that there may be a patient responsible charge related to this service. The patient expressed understanding and agreed to proceed.  Patient location: Home Provider location: Home  Diagnosis: COVID-19 infection  Purpose of visit: Discussion of potential use of Molnupiravir or Paxlovid, a new treatment for mild to moderate COVID-19 viral infection in non-hospitalized patients.   Subjective: Patient is a 75 y.o. male who has been diagnosed with COVID 19 viral infection.  Their symptoms began on 5/28  with sore throat, fever, headache and congestion.    Past Medical History:  Diagnosis Date  . Syncope     No Known Allergies  No current outpatient medications on file.  Objective: Patient appears/sounds sick..  They are in no apparent distress.  Breathing is non labored.  Mood and behavior are normal.  Laboratory Data:  No results found for this or any previous visit (from the past 2160 hour(s)).   Assessment: 75 y.o. male with mild/moderate COVID 19 viral infection diagnosed on 5/28  at high risk for progression to severe COVID 19.  Plan:  This patient is a 75 y.o. male that meets the following criteria for Emergency Use Authorization of: Molnupiravir  1. Age >18 yr 2. SARS-COV-2 positive test 3. Symptom onset < 5 days 4. Mild-to-moderate COVID disease with high risk for severe progression to hospitalization or death   I have spoken and communicated the following to the patient or parent/caregiver regarding: 1. Molnupiravir is an unapproved drug that is authorized for use under an  Print production planner.  2. There are no adequate, approved, available products for the treatment of COVID-19 in adults who have mild-to-moderate COVID-19 and are at high risk for progressing to severe COVID-19, including hospitalization or death. 3. Other therapeutics are currently authorized. For additional information on all products authorized for treatment or prevention of COVID-19, please see [employer].  4. There are benefits and risks of taking this treatment as outlined in the "Fact Sheet for Patients and Caregivers."  5. "Fact Sheet for Patients and Caregivers" was reviewed with patient. A hard copy will be provided to patient from pharmacy prior to the patient receiving treatment. 6. Patients should continue to self-isolate and use infection control measures (e.g., wear mask, isolate, social distance, avoid sharing personal items, clean and disinfect "high touch" surfaces, and frequent handwashing) according to CDC guidelines.  7. The patient or parent/caregiver has the option to accept or refuse treatment. 8. [place] has established a pregnancy surveillance program. 9. Females of childbearing potential should use a reliable method of contraception correctly and consistently, as applicable, for the duration of treatment and for 4 days after the last dose of Molnupiravir. 96. Males of reproductive potential who are sexually active with females of childbearing potential should use a reliable method of contraception correctly and consistently during treatment and for at least 3 months after the last dose. 11. Pregnancy status and risk was assessed. Patient verbalized understanding of precautions.   After reviewing above information with the patient, the patient agrees to receive molnupiravir.  Follow up instructions:    . Take prescription BID x 5 days as  directed .  Reach out to pharmacist for counseling on medication if desired . For concerns regarding further COVID symptoms please follow up with your PCP or urgent care . For urgent or life-threatening issues, seek care at your local emergency department  The patient was provided an opportunity to ask questions, and all were answered. The patient agreed with the plan and demonstrated an understanding of the instructions.   Script sent to  [facility] and opted to pick up RX.  The patient was advised to call their PCP or seek an in-person evaluation if the symptoms worsen or if the condition fails to improve as anticipated.   I provided 17 minutes of non face-to-face telephone visit time during this encounter, and > 50% was spent counseling as documented under my assessment & plan.  [place], [place] 02/19/2021 /10:46 AM

## 2021-03-12 ENCOUNTER — Encounter: Payer: Self-pay | Admitting: Family Medicine

## 2021-03-12 ENCOUNTER — Other Ambulatory Visit: Payer: Self-pay

## 2021-03-12 ENCOUNTER — Ambulatory Visit (INDEPENDENT_AMBULATORY_CARE_PROVIDER_SITE_OTHER): Payer: Medicare HMO | Admitting: Family Medicine

## 2021-03-12 VITALS — BP 119/68 | HR 55 | Resp 16 | Ht 72.0 in | Wt 178.0 lb

## 2021-03-12 DIAGNOSIS — R972 Elevated prostate specific antigen [PSA]: Secondary | ICD-10-CM

## 2021-03-12 DIAGNOSIS — R5383 Other fatigue: Secondary | ICD-10-CM

## 2021-03-12 DIAGNOSIS — U099 Post covid-19 condition, unspecified: Secondary | ICD-10-CM | POA: Diagnosis not present

## 2021-03-12 DIAGNOSIS — S0300XA Dislocation of jaw, unspecified side, initial encounter: Secondary | ICD-10-CM

## 2021-03-12 MED ORDER — NAPROXEN 500 MG PO TBEC: 500.0000 mg | DELAYED_RELEASE_TABLET | Freq: Two times a day (BID) | ORAL | 0 refills | Status: DC | PRN

## 2021-03-12 NOTE — Progress Notes (Signed)
I,April Miller,acting as a scribe for Wilhemena Durie, MD.,have documented all relevant documentation on the behalf of Wilhemena Durie, MD,as directed by  Wilhemena Durie, MD while in the presence of Wilhemena Durie, MD.   Established patient visit   Patient: Glenn Perez   DOB: 1945/10/11   75 y.o. Male  MRN: [id number] Visit Date: 03/12/2021  Today's healthcare provider: Wilhemena Durie, MD   Chief Complaint  Patient presents with   Follow-up   Subjective    HPI Patient is here for follow up. He had recent COVID but had a mild case.  He has been fully vaccinated. He has had some fatigue post COVID and has some pain in his right jaw that is very vague. No chest pain and no jaw claudication.  No cardiovascular symptoms and no headache.  He is now involved in the care of his 46 year old mother       Medications: No outpatient medications prior to visit.   No facility-administered medications prior to visit.    Review of Systems  Constitutional:  Negative for appetite change, chills and fever.  Respiratory:  Negative for chest tightness, shortness of breath and wheezing.   Cardiovascular:  Negative for chest pain and palpitations.  Gastrointestinal:  Negative for abdominal pain, nausea and vomiting.       Objective    BP 119/68 (BP Location: Left Arm, Patient Position: Sitting, Cuff Size: Normal)   Pulse (!) 55   Resp 16   Ht 6' (1.829 m)   Wt 178 lb (80.7 kg)   SpO2 98%   BMI 24.14 kg/m  BP Readings from Last 3 Encounters:  03/12/21 119/68  04/19/19 120/68  02/04/19 96/77   Wt Readings from Last 3 Encounters:  03/12/21 178 lb (80.7 kg)  04/19/19 189 lb (85.7 kg)  02/03/19 187 lb 3.2 oz (84.9 kg)       Physical Exam Vitals reviewed.  Constitutional:      Appearance: Normal appearance.  HENT:     Head: Normocephalic and atraumatic.     Right Ear: External ear normal.     Left Ear: External ear normal.     Nose: Nose  normal.     Mouth/Throat:     Mouth: Mucous membranes are moist.     Pharynx: Oropharynx is clear.  Eyes:     Extraocular Movements: Extraocular movements intact.     Conjunctiva/sclera: Conjunctivae normal.     Pupils: Pupils are equal, round, and reactive to light.  Cardiovascular:     Rate and Rhythm: Normal rate and regular rhythm.     Pulses: Normal pulses.     Heart sounds: Normal heart sounds.  Pulmonary:     Effort: Pulmonary effort is normal.     Breath sounds: Normal breath sounds.  Abdominal:     General: Abdomen is flat. Bowel sounds are normal.     Palpations: Abdomen is soft.  Musculoskeletal:     Cervical back: Normal range of motion and neck supple.  Skin:    General: Skin is warm and dry.  Neurological:     General: No focal deficit present.     Mental Status: He is alert and oriented to person, place, and time.  Psychiatric:        Mood and Affect: Mood normal.        Behavior: Behavior normal.        Thought Content: Thought content normal.  Judgment: Judgment normal.      No results found for any visits on 03/12/21.  Assessment & Plan     1. Post covid-19 condition, unspecified Follow-up lab work.  I think this is a normal COVID course in a vaccinated patient - CBC w/Diff/Platelet - Comprehensive Metabolic Panel (CMET) - TSH - Sed Rate (ESR) - C-reactive protein - Lipid panel  2. Fatigue, unspecified type Post COVID most likely - CBC w/Diff/Platelet - Comprehensive Metabolic Panel (CMET) - TSH - Sed Rate (ESR) - C-reactive protein - Lipid panel  3. Elevated PSA PSA is probably normal for his age but he wishes to have it checked.  I think in his overall setting of excellent health this is reasonable.  If it is elevated will refer directly to urology. - CBC w/Diff/Platelet - Comprehensive Metabolic Panel (CMET) - TSH - PSA - Sed Rate (ESR) - C-reactive protein - Lipid panel  4. Dislocation of temporomandibular joint, initial  encounter Patient describing some mild TMJ.  Discussed talking with his dentist to try bite block but at this time will check sed rate and CRPto make sure no possibility of temporal arteritis.  Try naproxen for 2 weeks - CBC w/Diff/Platelet - Comprehensive Metabolic Panel (CMET) - TSH - Sed Rate (ESR) - C-reactive protein - Lipid panel - naproxen (EC NAPROSYN) 500 MG EC tablet; Take 1 tablet (500 mg total) by mouth 2 (two) times daily as needed.  Dispense: 30 tablet; Refill: 0   Return in about 6 months (around 09/11/2021).      I, Wilhemena Durie, MD, have reviewed all documentation for this visit. The documentation on 03/17/21 for the exam, diagnosis, procedures, and orders are all accurate and complete.    Reyne Falconi Cranford Mon, MD  [facility] [phone] (phone) [phone] (fax)  [place]

## 2021-03-13 ENCOUNTER — Telehealth: Payer: Self-pay

## 2021-03-13 LAB — C-REACTIVE PROTEIN: CRP: 31 mg/L — ABNORMAL HIGH (ref 0–10)

## 2021-03-13 LAB — CBC WITH DIFFERENTIAL/PLATELET
Basophils Absolute: 0.1 10*3/uL (ref 0.0–0.2)
Basos: 1 %
EOS (ABSOLUTE): 0.2 10*3/uL (ref 0.0–0.4)
Eos: 3 %
Hematocrit: 38.2 % (ref 37.5–51.0)
Hemoglobin: 12.6 g/dL — ABNORMAL LOW (ref 13.0–17.7)
Immature Grans (Abs): 0 10*3/uL (ref 0.0–0.1)
Immature Granulocytes: 0 %
Lymphocytes Absolute: 1.8 10*3/uL (ref 0.7–3.1)
Lymphs: 25 %
MCH: 27.2 pg (ref 26.6–33.0)
MCHC: 33 g/dL (ref 31.5–35.7)
MCV: 82 fL (ref 79–97)
Monocytes Absolute: 0.8 10*3/uL (ref 0.1–0.9)
Monocytes: 12 %
Neutrophils Absolute: 4.2 10*3/uL (ref 1.4–7.0)
Neutrophils: 59 %
Platelets: 301 10*3/uL (ref 150–450)
RBC: 4.64 x10E6/uL (ref 4.14–5.80)
RDW: 12.8 % (ref 11.6–15.4)
WBC: 7.1 10*3/uL (ref 3.4–10.8)

## 2021-03-13 LAB — COMPREHENSIVE METABOLIC PANEL
ALT: 16 IU/L (ref 0–44)
AST: 27 IU/L (ref 0–40)
Albumin/Globulin Ratio: 1.9 (ref 1.2–2.2)
Albumin: 3.9 g/dL (ref 3.7–4.7)
Alkaline Phosphatase: 143 IU/L — ABNORMAL HIGH (ref 44–121)
BUN/Creatinine Ratio: 13 (ref 10–24)
BUN: 13 mg/dL (ref 8–27)
Bilirubin Total: 0.3 mg/dL (ref 0.0–1.2)
CO2: 24 mmol/L (ref 20–29)
Calcium: 9.3 mg/dL (ref 8.6–10.2)
Chloride: 102 mmol/L (ref 96–106)
Creatinine, Ser: 1.03 mg/dL (ref 0.76–1.27)
Globulin, Total: 2.1 g/dL (ref 1.5–4.5)
Glucose: 94 mg/dL (ref 65–99)
Potassium: 4.2 mmol/L (ref 3.5–5.2)
Sodium: 141 mmol/L (ref 134–144)
Total Protein: 6 g/dL (ref 6.0–8.5)
eGFR: 77 mL/min/{1.73_m2} (ref >59)

## 2021-03-13 LAB — LIPID PANEL
Chol/HDL Ratio: 3 ratio (ref 0.0–5.0)
Cholesterol, Total: 170 mg/dL (ref 100–199)
HDL: 57 mg/dL (ref >39)
LDL Chol Calc (NIH): 97 mg/dL (ref 0–99)
Triglycerides: 89 mg/dL (ref 0–149)
VLDL Cholesterol Cal: 16 mg/dL (ref 5–40)

## 2021-03-13 LAB — SEDIMENTATION RATE: Sed Rate: 40 mm/hr — ABNORMAL HIGH (ref 0–30)

## 2021-03-13 LAB — TSH: TSH: 3 u[IU]/mL (ref 0.450–4.500)

## 2021-03-13 LAB — PSA: Prostate Specific Ag, Serum: 5.1 ng/mL — ABNORMAL HIGH (ref 0.0–4.0)

## 2021-03-13 NOTE — Telephone Encounter (Signed)
Copied from [place] [id number]. Topic: General - Inquiry >> Mar 13, 2021  9:00 AM Loma Boston wrote: Reason for CRM: Pt picked up a med from pharmacy this am and was told that it was documented in his chart of allergic reactions and he is upset as he feels that is not true and wants to know if Dr Darnell Level has documented something he is not aware of, pt wants a FU re as upsetting.  FYI Also mentioned when calling needs to discuss his recent labs, has not been advised, does not understand the report. [phone] Phone may be on vibrate, if does not pu leave a message

## 2021-03-13 NOTE — Telephone Encounter (Signed)
Please advise results? 

## 2021-03-18 ENCOUNTER — Telehealth: Payer: Self-pay

## 2021-03-18 DIAGNOSIS — R972 Elevated prostate specific antigen [PSA]: Secondary | ICD-10-CM

## 2021-03-18 NOTE — Telephone Encounter (Signed)
Pt advised of lab results.  Will mail a hard copy to his home address.  Referral for Urology ordered.   I also advised pt we do not show he is allergic to any medications and to contact his pharmacy to remove it from their records.   Thanks,    -Mickel Baas

## 2021-03-18 NOTE — Telephone Encounter (Signed)
Copied from [place] [id number]. Topic: General - Inquiry >> Mar 13, 2021  9:00 AM Loma Boston wrote: Reason for CRM: Pt picked up a med from pharmacy this am and was told that it was documented in his chart of allergic reactions and he is upset as he feels that is not true and wants to know if Dr Darnell Level has documented something he is not aware of, pt wants a FU re as upsetting.  FYI Also mentioned when calling needs to discuss his recent labs, has not been advised, does not understand the report. [phone] Phone may be on vibrate, if does not pu leave a message >> Mar 17, 2021  4:57 PM [place], Ja-Kwan wrote: Pt stated he never received a call back in regards to his request to have his chart updated with the correct information. Pt stated he is not allergic to anything and he needs that information to be corrected on his chart. Pt also stated that he needs a hard copy of the most recent lab results with an explanation of what everything means. Pt requests call back once lab results are ready for pick up.

## 2021-03-28 ENCOUNTER — Other Ambulatory Visit: Payer: Self-pay

## 2021-03-28 ENCOUNTER — Ambulatory Visit: Payer: Medicare HMO | Admitting: Urology

## 2021-03-28 ENCOUNTER — Encounter: Payer: Self-pay | Admitting: Urology

## 2021-03-28 VITALS — BP 148/73 | HR 56 | Ht 72.0 in | Wt 172.0 lb

## 2021-03-28 DIAGNOSIS — N400 Enlarged prostate without lower urinary tract symptoms: Secondary | ICD-10-CM

## 2021-03-28 DIAGNOSIS — R972 Elevated prostate specific antigen [PSA]: Secondary | ICD-10-CM | POA: Diagnosis not present

## 2021-03-28 MED ORDER — TAMSULOSIN HCL 0.4 MG PO CAPS: 0.4000 mg | ORAL_CAPSULE | Freq: Every day | ORAL | 0 refills | Status: DC

## 2021-03-28 NOTE — Progress Notes (Signed)
03/28/2021 11:20 AM   Glenn Perez 18-Jan-1946 [id number]  Referring provider: Jerrol Banana., MD [address] [place],  [place] [address]  Chief Complaint  Patient presents with   Elevated PSA    HPI: Glenn Perez is a 75 y.o. male referred for evaluation of an elevated PSA.  PSA 03/12/2021 elevated 5.1; prior PSA January 2020 4.0 Mild urinary frequency but no bothersome LUTS Denies dysuria, gross hematuria No flank, abdominal or pelvic pain No prior urologic problems Older brother (3 years) diagnosed with prostate cancer 5-6 years ago and treated with prostate brachytherapy, doing well   PMH: Past Medical History:  Diagnosis Date   Syncope     Surgical History: Past Surgical History:  Procedure Laterality Date   APPENDECTOMY     KNEE SURGERY     NOSE SURGERY     x 2.   REPLACEMENT TOTAL KNEE     SHOULDER SURGERY  10/2015   for partially torn rotator cuff and detached bicep tendon.    Home Medications:  Allergies as of 03/28/2021   No Known Allergies      Medication List        Accurate as of March 28, 2021 11:20 AM. If you have any questions, ask your nurse or doctor.          STOP taking these medications    naproxen 500 MG EC tablet Commonly known as: EC NAPROSYN Stopped by: Abbie Sons, MD        Allergies: No Known Allergies  Family History: Family History  Problem Relation Age of Onset   Diverticulosis Mother    Cancer Brother        unsure of what kind    Social History:  reports that he has never smoked. He has never used smokeless tobacco. He reports current alcohol use of about 1.0 standard drink of alcohol per week. He reports that he does not use drugs.   Physical Exam: BP (!) 148/73 (BP Location: Left Arm, Patient Position: Sitting, Cuff Size: Normal)   Pulse (!) 56   Ht 6' (1.829 m)   Wt 172 lb (78 kg)   BMI 23.33 kg/m   Constitutional:  Alert and oriented, No acute  distress. HEENT: [place] AT, moist mucus membranes.  Trachea midline, no masses. Cardiovascular: No clubbing, cyanosis, or edema. Respiratory: Normal respiratory effort, no increased work of breathing. GU: Prostate 50 g, smooth without nodules Skin: No rashes, bruises or suspicious lesions. Neurologic: Grossly intact, no focal deficits, moving all 4 extremities. Psychiatric: Normal mood and affect.   Assessment & Plan:    1.  Elevated PSA Although PSA is a prostate cancer screening test he was informed that cancer is not the most common cause of an elevated PSA. Other potential causes including BPH and inflammation were discussed. He was informed that the only way to adequately diagnose prostate cancer would be a transrectal ultrasound and biopsy of the prostate. The procedure was discussed including potential risks of bleeding and infection/sepsis. He was also informed that a negative biopsy does not conclusively rule out the possibility that prostate cancer may be present and that continued monitoring is required. The use of newer adjunctive blood tests including 4kScore was discussed. The use of multiparametric prostate MRI was also discussed as well as surveillance was also discussed. Will initially repeat his PSA after a 30-day Rx tamsulosin and he will think over these options if the PSA remains persistently elevated  2.  BPH without lower urinary tract symptoms   Abbie Sons, MD  [facility] [address] [place], [place] [address] [phone]

## 2021-03-29 ENCOUNTER — Encounter: Payer: Self-pay | Admitting: Urology

## 2021-03-29 DIAGNOSIS — R972 Elevated prostate specific antigen [PSA]: Secondary | ICD-10-CM | POA: Insufficient documentation

## 2021-03-29 DIAGNOSIS — N400 Enlarged prostate without lower urinary tract symptoms: Secondary | ICD-10-CM | POA: Insufficient documentation

## 2021-04-20 ENCOUNTER — Other Ambulatory Visit: Payer: Self-pay | Admitting: Urology

## 2021-05-01 ENCOUNTER — Other Ambulatory Visit: Payer: Medicare HMO

## 2021-05-01 ENCOUNTER — Other Ambulatory Visit: Payer: Self-pay

## 2021-05-01 DIAGNOSIS — R972 Elevated prostate specific antigen [PSA]: Secondary | ICD-10-CM

## 2021-05-02 ENCOUNTER — Encounter: Payer: Self-pay | Admitting: Urology

## 2021-05-02 LAB — PSA: Prostate Specific Ag, Serum: 5.4 ng/mL — ABNORMAL HIGH (ref 0.0–4.0)

## 2021-05-05 ENCOUNTER — Other Ambulatory Visit: Payer: Self-pay | Admitting: Urology

## 2021-05-05 DIAGNOSIS — R972 Elevated prostate specific antigen [PSA]: Secondary | ICD-10-CM

## 2021-05-08 DIAGNOSIS — Z0100 Encounter for examination of eyes and vision without abnormal findings: Secondary | ICD-10-CM | POA: Diagnosis not present

## 2021-05-08 DIAGNOSIS — H5203 Hypermetropia, bilateral: Secondary | ICD-10-CM | POA: Diagnosis not present

## 2021-05-08 DIAGNOSIS — H2512 Age-related nuclear cataract, left eye: Secondary | ICD-10-CM | POA: Diagnosis not present

## 2021-05-08 DIAGNOSIS — H524 Presbyopia: Secondary | ICD-10-CM | POA: Diagnosis not present

## 2021-05-08 DIAGNOSIS — H52223 Regular astigmatism, bilateral: Secondary | ICD-10-CM | POA: Diagnosis not present

## 2021-05-08 DIAGNOSIS — Z9841 Cataract extraction status, right eye: Secondary | ICD-10-CM | POA: Diagnosis not present

## 2021-05-15 ENCOUNTER — Ambulatory Visit
Admission: RE | Admit: 2021-05-15 | Discharge: 2021-05-15 | Disposition: A | Discharge status: Home / Self Care | Payer: Medicare HMO | Source: Ambulatory Visit | Attending: Urology | Admitting: Urology

## 2021-05-15 ENCOUNTER — Other Ambulatory Visit: Payer: Self-pay

## 2021-05-15 DIAGNOSIS — R972 Elevated prostate specific antigen [PSA]: Secondary | ICD-10-CM

## 2021-05-15 DIAGNOSIS — R590 Localized enlarged lymph nodes: Secondary | ICD-10-CM | POA: Diagnosis not present

## 2021-05-15 DIAGNOSIS — M316 Other giant cell arteritis: Secondary | ICD-10-CM | POA: Diagnosis not present

## 2021-05-15 MED ORDER — GADOBUTROL 1 MMOL/ML IV SOLN
7.0000 mL | Freq: Once | INTRAVENOUS | Status: AC | PRN
  Administered 2021-05-15: 7 mL via INTRAVENOUS

## 2021-05-16 ENCOUNTER — Telehealth: Payer: Self-pay

## 2021-05-16 ENCOUNTER — Other Ambulatory Visit: Payer: Self-pay

## 2021-05-16 ENCOUNTER — Inpatient Hospital Stay
Admission: EM | Admit: 2021-05-16 | Discharge: 2021-05-20 | DRG: 517 | Disposition: A | Discharge status: Home / Self Care | Payer: Medicare HMO | Attending: Internal Medicine | Admitting: Internal Medicine

## 2021-05-16 ENCOUNTER — Encounter: Payer: Self-pay | Admitting: Intensive Care

## 2021-05-16 ENCOUNTER — Other Ambulatory Visit: Payer: Self-pay | Admitting: Urology

## 2021-05-16 DIAGNOSIS — M316 Other giant cell arteritis: Secondary | ICD-10-CM | POA: Diagnosis not present

## 2021-05-16 DIAGNOSIS — Z8616 Personal history of COVID-19: Secondary | ICD-10-CM

## 2021-05-16 DIAGNOSIS — N400 Enlarged prostate without lower urinary tract symptoms: Secondary | ICD-10-CM | POA: Diagnosis not present

## 2021-05-16 DIAGNOSIS — M25561 Pain in right knee: Secondary | ICD-10-CM | POA: Diagnosis not present

## 2021-05-16 DIAGNOSIS — I480 Paroxysmal atrial fibrillation: Secondary | ICD-10-CM | POA: Diagnosis present

## 2021-05-16 DIAGNOSIS — H539 Unspecified visual disturbance: Secondary | ICD-10-CM | POA: Diagnosis not present

## 2021-05-16 DIAGNOSIS — I771 Stricture of artery: Secondary | ICD-10-CM | POA: Diagnosis not present

## 2021-05-16 DIAGNOSIS — H47332 Pseudopapilledema of optic disc, left eye: Secondary | ICD-10-CM | POA: Diagnosis not present

## 2021-05-16 DIAGNOSIS — Z20822 Contact with and (suspected) exposure to covid-19: Secondary | ICD-10-CM | POA: Diagnosis not present

## 2021-05-16 DIAGNOSIS — R55 Syncope and collapse: Secondary | ICD-10-CM | POA: Diagnosis not present

## 2021-05-16 DIAGNOSIS — R9389 Abnormal findings on diagnostic imaging of other specified body structures: Secondary | ICD-10-CM

## 2021-05-16 DIAGNOSIS — Z96652 Presence of left artificial knee joint: Secondary | ICD-10-CM | POA: Diagnosis present

## 2021-05-16 DIAGNOSIS — H547 Unspecified visual loss: Secondary | ICD-10-CM | POA: Diagnosis not present

## 2021-05-16 DIAGNOSIS — R972 Elevated prostate specific antigen [PSA]: Secondary | ICD-10-CM

## 2021-05-16 DIAGNOSIS — H53132 Sudden visual loss, left eye: Secondary | ICD-10-CM | POA: Diagnosis not present

## 2021-05-16 DIAGNOSIS — H47012 Ischemic optic neuropathy, left eye: Secondary | ICD-10-CM | POA: Diagnosis not present

## 2021-05-16 DIAGNOSIS — H5461 Unqualified visual loss, right eye, normal vision left eye: Secondary | ICD-10-CM

## 2021-05-16 DIAGNOSIS — I6523 Occlusion and stenosis of bilateral carotid arteries: Secondary | ICD-10-CM | POA: Diagnosis not present

## 2021-05-16 DIAGNOSIS — H538 Other visual disturbances: Secondary | ICD-10-CM | POA: Diagnosis not present

## 2021-05-16 LAB — COMPREHENSIVE METABOLIC PANEL
ALT: 17 U/L (ref 0–44)
AST: 23 U/L (ref 15–41)
Albumin: 3.9 g/dL (ref 3.5–5.0)
Alkaline Phosphatase: 118 U/L (ref 38–126)
Anion gap: 6 (ref 5–15)
BUN: 13 mg/dL (ref 8–23)
CO2: 28 mmol/L (ref 22–32)
Calcium: 9.3 mg/dL (ref 8.9–10.3)
Chloride: 104 mmol/L (ref 98–111)
Creatinine, Ser: 0.94 mg/dL (ref 0.61–1.24)
GFR, Estimated: >60 mL/min (ref >60)
Glucose, Bld: 93 mg/dL (ref 70–99)
Potassium: 4 mmol/L (ref 3.5–5.1)
Sodium: 138 mmol/L (ref 135–145)
Total Bilirubin: 0.6 mg/dL (ref 0.3–1.2)
Total Protein: 7.2 g/dL (ref 6.5–8.1)

## 2021-05-16 LAB — RESP PANEL BY RT-PCR (FLU A&B, COVID) ARPGX2
Influenza A by PCR: NEGATIVE
Influenza B by PCR: NEGATIVE
SARS Coronavirus 2 by RT PCR: NEGATIVE

## 2021-05-16 LAB — CBC WITH DIFFERENTIAL/PLATELET
Abs Immature Granulocytes: 0.02 10*3/uL (ref 0.00–0.07)
Basophils Absolute: 0.1 10*3/uL (ref 0.0–0.1)
Basophils Relative: 1 %
Eosinophils Absolute: 0.1 10*3/uL (ref 0.0–0.5)
Eosinophils Relative: 2 %
HCT: 39.6 % (ref 39.0–52.0)
Hemoglobin: 12.7 g/dL — ABNORMAL LOW (ref 13.0–17.0)
Immature Granulocytes: 0 %
Lymphocytes Relative: 19 %
Lymphs Abs: 1.4 10*3/uL (ref 0.7–4.0)
MCH: 26 pg (ref 26.0–34.0)
MCHC: 32.1 g/dL (ref 30.0–36.0)
MCV: 81.1 fL (ref 80.0–100.0)
Monocytes Absolute: 0.8 10*3/uL (ref 0.1–1.0)
Monocytes Relative: 10 %
Neutro Abs: 5.2 10*3/uL (ref 1.7–7.7)
Neutrophils Relative %: 68 %
Platelets: 311 10*3/uL (ref 150–400)
RBC: 4.88 MIL/uL (ref 4.22–5.81)
RDW: 13.8 % (ref 11.5–15.5)
WBC: 7.6 10*3/uL (ref 4.0–10.5)
nRBC: 0 % (ref 0.0–0.2)

## 2021-05-16 LAB — CREATININE, SERUM
Creatinine, Ser: 0.92 mg/dL (ref 0.61–1.24)
GFR, Estimated: >60 mL/min (ref >60)

## 2021-05-16 LAB — SEDIMENTATION RATE: Sed Rate: 46 mm/hr — ABNORMAL HIGH (ref 0–20)

## 2021-05-16 LAB — C-REACTIVE PROTEIN: CRP: 5.3 mg/dL — ABNORMAL HIGH (ref <1.0)

## 2021-05-16 MED ORDER — ENOXAPARIN SODIUM 40 MG/0.4ML IJ SOSY
40.0000 mg | PREFILLED_SYRINGE | INTRAMUSCULAR | Status: DC
Start: 2021-05-16 — End: 2021-05-19
  Administered 2021-05-16 – 2021-05-18 (×3): 40 mg via SUBCUTANEOUS
  Filled 2021-05-16 (×3): qty 0.4

## 2021-05-16 MED ORDER — ONDANSETRON HCL 4 MG PO TABS: 4.0000 mg | ORAL_TABLET | Freq: Four times a day (QID) | ORAL | Status: DC | PRN

## 2021-05-16 MED ORDER — SODIUM CHLORIDE 0.9 % IV SOLN: INTRAVENOUS | Status: DC

## 2021-05-16 MED ORDER — ONDANSETRON HCL 4 MG/2ML IJ SOLN: 4.0000 mg | Freq: Four times a day (QID) | INTRAMUSCULAR | Status: DC | PRN

## 2021-05-16 MED ORDER — SODIUM CHLORIDE 0.9 % IV SOLN
1000.0000 mg | Freq: Every day | INTRAVENOUS | Status: DC
  Administered 2021-05-17: 1000 mg via INTRAVENOUS
  Filled 2021-05-16 (×3): qty 8

## 2021-05-16 MED ORDER — SODIUM CHLORIDE 0.9 % IV SOLN
1000.0000 mg | Freq: Once | INTRAVENOUS | Status: AC
  Administered 2021-05-16: 1000 mg via INTRAVENOUS
  Filled 2021-05-16: qty 8

## 2021-05-16 NOTE — ED Notes (Signed)
Hospitalist at bedside 

## 2021-05-16 NOTE — ED Triage Notes (Signed)
Yesterday afternoon, patient started having a cloud over his left. Patients eye doctor believes he has inflammation of the arteries and was sent to ER. Also c/o fatigue.

## 2021-05-16 NOTE — ED Notes (Signed)
Pharmacy called at this time to get solu-medrol to ED for patient. Per ophthalmologic they want asap to prevent vision loss. Pharmacy states they will get it down as soon as possible.

## 2021-05-16 NOTE — Telephone Encounter (Signed)
Incoming call from pt on triage line requesting the results of his MRI, he has seen it on MyChart. He believes that his next step is a referral to [facility] which he would like placed. He would like a call to discuss the next steps specifically. Please advise.

## 2021-05-16 NOTE — ED Notes (Signed)
Message sent to pharmacy to get solu-medrol asap.

## 2021-05-16 NOTE — Telephone Encounter (Signed)
I have already sent a MyChart message to Glenn Perez. (Look under the prostate MRI result) regarding the results and the next steps which are a fusion biopsy in [place].  We have already sent the referral to [facility] and Glenn Perez has faxed a cardiac clearance to Glenn Perez office to stop his ASA.  If he has further questions, Glenn Perez will be back next week and he can send a MyChart message.  [name] is currently in the ED regarding an eye issue, so you will likely not be able to reach him today.

## 2021-05-16 NOTE — Telephone Encounter (Signed)
-----   Message from Nori Riis, PA-C sent at 05/16/2021  7:25 AM EDT ----- He needs to be cleared to stop his ASA prior to his prostate biopsy in [place].  His cardiologist is Dr. Clayborn Bigness.

## 2021-05-16 NOTE — Progress Notes (Signed)
Patient ID: Glenn Perez, male   DOB: 12/07/1945, 75 y.o.   MRN: [id number]  Reason for Consult:loss of vision OS Referring Physician: Siadecki  Dantonio T Perez is an 75 y.o. male.  Chief complaint:sudden va loss OS <principal problem not specified>  HPI: 75 yo healthy WM noted blurry vision OS while playing tennis match last night.  Went to bed and woke this AM still blurry, perhaps worse.  Played tennis this AM and noted definite worsening of Va OS and loss of depth perception.  Went to OD and recommended come to ED for evaluation of possible temporal arteritis.  Recent history: TMJ symptoms with jaw pain treated with tooth extraction and PO Prednisone (June 2022). At the time, elevated CRP 3x normal.  Elevated PSA and pt had pelvic MRI on 05/15/21.  Pt denies HA's but has had more fatigue.  No fevers, wgt loss, myalgias, or scalp tenderness. Jaw pain as noted above. Pt takes no meds except vitamins and denies HTN and sleep apnea.  POH: hyphema OD (age 20's) CE/IOL OD 2013.  Cataract OS - mild  Past Medical History:  Diagnosis Date   Syncope     ROS  Past Surgical History:  Procedure Laterality Date   APPENDECTOMY     KNEE SURGERY     NOSE SURGERY     x 2.   REPLACEMENT TOTAL KNEE     SHOULDER SURGERY  10/2015   for partially torn rotator cuff and detached bicep tendon.    Family History  Problem Relation Age of Onset   Diverticulosis Mother    Cancer Brother        unsure of what kind    Social History:  reports that he has never smoked. He has never used smokeless tobacco. He reports current alcohol use of about 1.0 standard drink per week. He reports that he does not use drugs.  Allergies: No Known Allergies  Medications: I have reviewed the patient's current medications.  Results for orders placed or performed during the hospital encounter of 05/16/21 (from the past 48 hour(s))  Sedimentation rate     Status: Abnormal   Collection Time: 05/16/21  3:18  PM  Result Value Ref Range   Sed Rate 46 (H) 0 - 20 mm/hr    Comment: Performed at [facility], [address] [place], [place] [address]  CBC with Differential     Status: Abnormal   Collection Time: 05/16/21  3:18 PM  Result Value Ref Range   WBC 7.6 4.0 - 10.5 K/uL   RBC 4.88 4.22 - 5.81 MIL/uL   Hemoglobin 12.7 (L) 13.0 - 17.0 g/dL   HCT 39.6 39.0 - 52.0 %   MCV 81.1 80.0 - 100.0 fL   MCH 26.0 26.0 - 34.0 pg   MCHC 32.1 30.0 - 36.0 g/dL   RDW 13.8 11.5 - 15.5 %   Platelets 311 150 - 400 K/uL   nRBC 0.0 0.0 - 0.2 %   Neutrophils Relative % 68 %   Neutro Abs 5.2 1.7 - 7.7 K/uL   Lymphocytes Relative 19 %   Lymphs Abs 1.4 0.7 - 4.0 K/uL   Monocytes Relative 10 %   Monocytes Absolute 0.8 0.1 - 1.0 K/uL   Eosinophils Relative 2 %   Eosinophils Absolute 0.1 0.0 - 0.5 K/uL   Basophils Relative 1 %   Basophils Absolute 0.1 0.0 - 0.1 K/uL   Immature Granulocytes 0 %   Abs Immature Granulocytes 0.02 0.00 -   0.07 K/uL    Comment: Performed at [facility], [address] [place], [place] [address]  Comprehensive metabolic panel     Status: None   Collection Time: 05/16/21  3:18 PM  Result Value Ref Range   Sodium 138 135 - 145 mmol/L   Potassium 4.0 3.5 - 5.1 mmol/L   Chloride 104 98 - 111 mmol/L   CO2 28 22 - 32 mmol/L   Glucose, Bld 93 70 - 99 mg/dL    Comment: Glucose reference range applies only to samples taken after fasting for at least 8 hours.   BUN 13 8 - 23 mg/dL   Creatinine, Ser 0.94 0.61 - 1.24 mg/dL   Calcium 9.3 8.9 - 10.3 mg/dL   Total Protein 7.2 6.5 - 8.1 g/dL   Albumin 3.9 3.5 - 5.0 g/dL   AST 23 15 - 41 U/L   ALT 17 0 - 44 U/L   Alkaline Phosphatase 118 38 - 126 U/L   Total Bilirubin 0.6 0.3 - 1.2 mg/dL   GFR, Estimated >60 >60 mL/min    Comment: (NOTE) Calculated using the CKD-EPI Creatinine Equation (2021)    Anion gap 6 5 - 15    Comment: Performed at [facility], [address] [place], [place] [address]    MR  PROSTATE W WO CONTRAST  Result Date: 05/15/2021 CLINICAL DATA:  Elevated PSA in a 75 year old male, increasing over time. EXAM: MR PROSTATE WITHOUT AND WITH CONTRAST TECHNIQUE: Multiplanar multisequence MRI images were obtained of the pelvis centered about the prostate. Pre and post contrast images were obtained. CONTRAST:  60m GADAVIST GADOBUTROL 1 MMOL/ML IV SOLN COMPARISON:  CT of the abdomen and pelvis of July of 2013 FINDINGS: Prostate: Transitional zone: Signs of BPH in the transitional zone. Area of T2 hypointensity blending with anterior fibromuscular stroma but without the typical pattern of fibromuscular stroma displays potential increased/strict diffusion and is centered in the mid to apical anterior midline tracking towards the apex (image 17/5) 14 x 6 mm. T2 hypointensity with relative homogeneous appearance and indistinct margins. PIRADS category 4 Peripheral zone: Linear and wedge-shaped areas of T2 hypointensity throughout the peripheral zone without high-risk lesion. Volume: 29 cc Transcapsular spread:  Absent Seminal vesicle involvement: Absent Neurovascular bundle involvement: Absent Pelvic adenopathy: Absent Bone metastasis: Absent Other findings: Cystic area in the LEFT hemiscrotum measures approximately 2.8 cm and is well-circumscribed and only partially visible. This is suggested on previous CT evaluations from 2013 as well. IMPRESSION: PIRADS category 4 lesion in the mid to apical prostate in the anterior gland as described. Prostate segmented and lesion marked in DynaCAD. No evidence of extracapsular extension, seminal vesicle invasion, or metastatic disease. Cystic area in the LEFT hemiscrotum measures approximately 2.8 cm and is only partially visible. This is suggested on previous CT evaluation from 2013 as well. This may represent a seminal vesicle cyst or spermatocele. Please correlate with physical exam and history with ultrasound correlation as warranted. Electronically Signed   By:  GZetta BillsM.D.   On: 05/15/2021 16:43    Blood pressure (!) 152/72, pulse (!) 58, temperature 97.7 F (36.5 C), temperature source Oral, resp. rate 18, height 6' (1.829 m), weight 77.6 kg, SpO2 100 %.  Mental status: Alert and Oriented x 4  Visual Acuity:  20/25 OD  20/200 near [place]  Pupils:  dilated at OD office  Motility:  Full/ orthophoric  Visual Fields:  Full to confrontation OD.  Superior central scotoma OS  IOP:  deferred, done at OD office  External/ Lids/ Lashes:  Normal  Anterior Segment:  Conjunctiva:  Normal  OU  Cornea:  Normal  OU  Anterior Chamber: Normal  OU  Lens:   IOL OD mild NS OS  Posterior Segment: Dilated OU at outside OD office Exam with 20 D lens/ indirect  Discs:   Normal c/d 0.1 ratio, no pallor, no edema OD     OS 0.1  mild edema no hemorrhage.  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  Should start with IV steroids (solumedrol 1gm) and may transition to PO Pred 80 mg qday.  Recommend TA biopsy with vascular surgery within the next several days. PPI (omeprazole) if needed for GI prophylaxis. The history is highly suggestive of GCA, though non-ischemic anterior ischemic optic neuropathy or branch retinal artery occlusion is possible.   Other causes of disc edema (compressive) less likely given the acute onset of vision loss.  I would also recommend carotid dopplers.  Follow up this week at [facility] with me.   Taylore Hinde 05/16/2021, 5:58 PM

## 2021-05-16 NOTE — ED Provider Notes (Signed)
[facility] Emergency Department Provider Note ____________________________________________   Event Date/Time   First MD Initiated Contact with Patient 05/16/21 1528     (approximate)  I have reviewed the triage vital signs and the nursing notes.   HISTORY  Chief Complaint Blurred Vision    HPI Glenn Perez is a 75 y.o. male with PMH as noted below including BPH and atrial fibrillation who presents with left eye vision loss, acute onset yesterday described as a dark cloud in the center of the vision of his left eye.  He denies any associated eye pain, headache, flashes or floaters, nausea or vomiting, or fever.  He does report some generalized fatigue.  Past Medical History:  Diagnosis Date   Syncope     Patient Active Problem List   Diagnosis Date Noted   Temporal arteritis ([place]) 05/16/2021   Elevated PSA 03/29/2021   Benign prostatic hyperplasia without lower urinary tract symptoms 03/29/2021   COVID-19 02/19/2021   History of total knee replacement, left 03/31/2019   Syncope 02/03/2019   Paroxysmal A-fib ([place]) 02/03/2019   Pain in right knee 05/26/2018   Arthritis 07/30/2017   Chronic rhinitis 07/30/2017    Past Surgical History:  Procedure Laterality Date   APPENDECTOMY     KNEE SURGERY     NOSE SURGERY     x 2.   REPLACEMENT TOTAL KNEE     SHOULDER SURGERY  10/2015   for partially torn rotator cuff and detached bicep tendon.    Prior to Admission medications   Medication Sig Start Date End Date Taking? Authorizing Provider  tamsulosin (FLOMAX) 0.4 MG CAPS capsule Take 1 capsule (0.4 mg total) by mouth daily. Patient not taking: Reported on 05/16/2021 03/28/21   Abbie Sons, MD    Allergies Patient has no known allergies.  Family History  Problem Relation Age of Onset   Diverticulosis Mother    Cancer Brother        unsure of what kind    Social History Social History   Tobacco Use   Smoking status: Never    Smokeless tobacco: Never  Vaping Use   Vaping Use: Never used  Substance Use Topics   Alcohol use: Yes    Alcohol/week: 1.0 standard drink    Types: 1 Standard drinks or equivalent per week   Drug use: No    Review of Systems  Constitutional: No fever.  Positive for fatigue. Eyes: Positive for left eye vision loss. ENT: No sore throat. Cardiovascular: Denies chest pain. Respiratory: Denies shortness of breath. Gastrointestinal: No vomiting or diarrhea.  Genitourinary: Negative for dysuria.  Musculoskeletal: Negative for back pain. Skin: Negative for rash. Neurological: Negative for headaches, focal weakness or numbness.   ____________________________________________   PHYSICAL EXAM:  VITAL SIGNS: ED Triage Vitals  Enc Vitals Group     BP 05/16/21 1508 (!) 151/70     Pulse Rate 05/16/21 1508 (!) 56     Resp 05/16/21 1508 16     Temp 05/16/21 1508 97.7 F (36.5 C)     Temp Source 05/16/21 1508 Oral     SpO2 05/16/21 1508 99 %     Weight 05/16/21 1509 171 lb (77.6 kg)     Height 05/16/21 1509 6' (1.829 m)     Head Circumference --      Peak Flow --      Pain Score 05/16/21 1509 0     Pain Loc --      Pain  Edu? --      Excl. in [place]? --     Constitutional: Alert and oriented. Well appearing and in no acute distress. Eyes: Conjunctivae are normal.  Pupils dilated.  EOMI.  Bilateral pupils reactive to light, left pupil dilated more than right. Head: Atraumatic.  No temporal artery tenderness. Nose: No congestion/rhinnorhea. Mouth/Throat: Mucous membranes are moist.   Neck: Normal range of motion.  Cardiovascular: Normal rate, regular rhythm. Good peripheral circulation. Respiratory: Normal respiratory effort.  No retractions.  Gastrointestinal: No distention.  Musculoskeletal: Extremities warm and well perfused.  Neurologic:  Normal speech and language.  Motor intact in all extremities.  Normal gait.  Normal coordination.  No gross focal neurologic deficits are  appreciated.  Skin:  Skin is warm and dry. No rash noted. Psychiatric: Mood and affect are normal. Speech and behavior are normal.  ____________________________________________   LABS (all labs ordered are listed, but only abnormal results are displayed)  Labs Reviewed  SEDIMENTATION RATE - Abnormal; Notable for the following components:      Result Value   Sed Rate 46 (*)    All other components within normal limits  C-REACTIVE PROTEIN - Abnormal; Notable for the following components:   CRP 5.3 (*)    All other components within normal limits  CBC WITH DIFFERENTIAL/PLATELET - Abnormal; Notable for the following components:   Hemoglobin 12.7 (*)    All other components within normal limits  RESP PANEL BY RT-PCR (FLU A&B, COVID) ARPGX2  COMPREHENSIVE METABOLIC PANEL  CREATININE, SERUM  COMPREHENSIVE METABOLIC PANEL  CBC  CBC   ____________________________________________  EKG   ____________________________________________  RADIOLOGY    ____________________________________________   PROCEDURES  Procedure(s) performed: No  Procedures  Critical Care performed: No ____________________________________________   INITIAL IMPRESSION / ASSESSMENT AND PLAN / ED COURSE  Pertinent labs & imaging results that were available during my care of the patient were reviewed by me and considered in my medical decision making (see chart for details).  75 year old male with PMH as noted above presents referred from his optometrist office due to left eye vision change and concern for possible giant cell arteritis.  The patient reports a cloudy area of vision loss in that eye since yesterday and has had some fatigue but has no headache or other acute symptoms.  He states that the optometrist, Dr. Leata Mouse, evaluated him with multiple tests and said that there was a high probability of giant cell arteritis based on his evaluation and due to elevated ESR and CRP on recent labs.  I  reviewed the past medical records in epic; the patient did have lab work-up from 6/22 showing CRP of 31 and ESR of 40.  On exam currently the patient is well-appearing.  His vital signs are normal.  The physical exam is unremarkable.  He has no temporal artery tenderness.  We are unable to obtain a visual acuity since the pupils are dilated.  We will obtain lab work-up here.  I have reached out to get in contact with the optometrist who referred him for further information as I do not have any access to the chart.  I will also consult ophthalmology here.  ----------------------------------------- 5:09 PM on 05/16/2021 -----------------------------------------  I discussed the case with Dr. Rick Duff, the patient's optometrist, who stated that on his evaluation the patient's optic disc was swollen on the left.  He reported that the patient had also had some jaw claudication recently.  The combination of this, the fatigue, and the central scotoma,  as well as the recent elevated ESR and CRP concerned him for giant cell arteritis.  I consulted Dr. Wallace Going from ophthalmology.  He will evaluate the patient here in the ED.  He recommends to hold off on empiric steroids until he sees the patient.  ----------------------------------------- 6:33 PM on 05/16/2021 -----------------------------------------  Dr. Wallace Going has evaluated the patient and recommended starting IV steroids.  He also recommends admission for further workup and monitoring; I consulted Dr. Jonelle Sidle from the hospitalist service.    ____________________________________________   FINAL CLINICAL IMPRESSION(S) / ED DIAGNOSES  Final diagnoses:  Vision disturbance      NEW MEDICATIONS STARTED DURING THIS VISIT:  New Prescriptions   No medications on file     Note:  This document was prepared using Dragon voice recognition software and may include unintentional dictation errors.    Arta Silence, MD 05/16/21  [phone]

## 2021-05-16 NOTE — H&P (Signed)
History and Physical   Glenn Perez HQR:[id number] DOB: November 19, 1945 DOA: 05/16/2021  Referring MD/NP/PA: Dr. Cherylann Banas  PCP: Glenn Perez., MD   Outpatient Specialists: None   Patient coming from: None  Chief Complaint: Visual loss  HPI: Glenn Perez is a 75 y.o. male with medical history significant of history of syncope, paroxysmal atrial fibrillation, BPH who apparently was playing tennis last night when he felt blurry vision.  He went to bed and woke up this morning still blurry especially in the right eye.  He went to play tennis again this morning but his vision was getting worse.  He went to see him ophthalmologist who recommend that he comes to the ER for evaluation of temporal arteritis.  In the ER patient was seen and evaluated and found to have elevated CRP as well as sed rate.  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  Recommend admission with high-dose steroids.  Medical admission requested for.  Patient denied any headaches.  He still blurry in his vision.  No prior diagnosis of visual changes or temporal arteritis.  He is little bit fatigued.  Denied any tenderness over the TMJ symptoms.  He apparently had TMJ symptoms back in June when he had tooth extraction but then got prednisone.  Patient is being followed by urology for elevated PSA and known history of BPH.  At this point patient is being admitted to the hospital for evaluation and treatment..  ED Course: Temperature is 97.7 blood pressure 128/64, pulse 58, respirate of 18 and oxygen sats 98% on room air.  White count 7.6 hemoglobin 12.7 platelets 311.  The rest of the chemistry appeared to be within normal.  Sed rate is 46.  Respiratory panel currently pending.  Ophthalmology assessment has been done and patient is being admitted with suspicion for temporal arteritis.  Review of Systems: As per HPI otherwise 10 point review of systems negative.    Past Medical History:   Diagnosis Date   Syncope     Past Surgical History:  Procedure Laterality Date   APPENDECTOMY     KNEE SURGERY     NOSE SURGERY     x 2.   REPLACEMENT TOTAL KNEE     SHOULDER SURGERY  10/2015   for partially torn rotator cuff and detached bicep tendon.     reports that he has never smoked. He has never used smokeless tobacco. He reports current alcohol use of about 1.0 standard drink per week. He reports that he does not use drugs.  No Known Allergies  Family History  Problem Relation Age of Onset   Diverticulosis Mother    Cancer Brother        unsure of what kind     Prior to Admission medications   Medication Sig Start Date End Date Taking? Authorizing Provider  tamsulosin (FLOMAX) 0.4 MG CAPS capsule Take 1 capsule (0.4 mg total) by mouth daily. Patient not taking: Reported on 05/16/2021 03/28/21   Abbie Sons, MD    Physical Exam: Vitals:   05/16/21 1700 05/16/21 1730 05/16/21 1800 05/16/21 1830  BP: 134/75 (!) 152/72 136/66 (!) 143/69  Pulse: (!) 50 (!) 58 (!) 52 (!) 56  Resp: 18 18 18 18   Temp:      TempSrc:      SpO2: 100% 100% 99% 98%  Weight:      Height:          Constitutional: Acutely ill looking, frustrated but no  distress Vitals:   05/16/21 1700 05/16/21 1730 05/16/21 1800 05/16/21 1830  BP: 134/75 (!) 152/72 136/66 (!) 143/69  Pulse: (!) 50 (!) 58 (!) 52 (!) 56  Resp: 18 18 18 18   Temp:      TempSrc:      SpO2: 100% 100% 99% 98%  Weight:      Height:       Eyes: PERRL, lids and conjunctivae normal, blurry vision ENMT: Mucous membranes are moist. Posterior pharynx clear of any exudate or lesions.Normal dentition.  Neck: normal, supple, no masses, no thyromegaly Respiratory: clear to auscultation bilaterally, no wheezing, no crackles. Normal respiratory effort. No accessory muscle use.  Cardiovascular: Sinus bradycardia, no murmurs / rubs / gallops. No extremity edema. 2+ pedal pulses. No carotid bruits.  Abdomen: no tenderness, no  masses palpated. No hepatosplenomegaly. Bowel sounds positive.  Musculoskeletal: no clubbing / cyanosis. No joint deformity upper and lower extremities. Good ROM, no contractures. Normal muscle tone.  Skin: no rashes, lesions, ulcers. No induration Neurologic: CN 2-12 grossly intact. Sensation intact, DTR normal. Strength 5/5 in all 4.  Psychiatric: Normal judgment and insight. Alert and oriented x 3. Normal mood.     Labs on Admission: I have personally reviewed following labs and imaging studies  CBC: Recent Labs  Lab 05/16/21 1518  WBC 7.6  NEUTROABS 5.2  HGB 12.7*  HCT 39.6  MCV 81.1  PLT 413   Basic Metabolic Panel: Recent Labs  Lab 05/16/21 1518  NA 138  K 4.0  CL 104  CO2 28  GLUCOSE 93  BUN 13  CREATININE 0.94  CALCIUM 9.3   GFR: Estimated Creatinine Clearance: 76.8 mL/min (by C-G formula based on SCr of 0.94 mg/dL). Liver Function Tests: Recent Labs  Lab 05/16/21 1518  AST 23  ALT 17  ALKPHOS 118  BILITOT 0.6  PROT 7.2  ALBUMIN 3.9   No results for input(s): LIPASE, AMYLASE in the last 168 hours. No results for input(s): AMMONIA in the last 168 hours. Coagulation Profile: No results for input(s): INR, PROTIME in the last 168 hours. Cardiac Enzymes: No results for input(s): CKTOTAL, CKMB, CKMBINDEX, TROPONINI in the last 168 hours. BNP (last 3 results) No results for input(s): PROBNP in the last 8760 hours. HbA1C: No results for input(s): HGBA1C in the last 72 hours. CBG: No results for input(s): GLUCAP in the last 168 hours. Lipid Profile: No results for input(s): CHOL, HDL, LDLCALC, TRIG, CHOLHDL, LDLDIRECT in the last 72 hours. Thyroid Function Tests: No results for input(s): TSH, T4TOTAL, FREET4, T3FREE, THYROIDAB in the last 72 hours. Anemia Panel: No results for input(s): VITAMINB12, FOLATE, FERRITIN, TIBC, IRON, RETICCTPCT in the last 72 hours. Urine analysis:    Component Value Date/Time   COLORURINE Yellow 03/24/2012 0420    COLORURINE YELLOW 05/21/2009 0839   APPEARANCEUR Cloudy 03/24/2012 0420   LABSPEC 1.020 03/24/2012 0420   PHURINE 7.0 03/24/2012 0420   PHURINE 6.0 05/21/2009 0839   GLUCOSEU Negative 03/24/2012 0420   HGBUR 3+ 03/24/2012 0420   HGBUR NEGATIVE 05/21/2009 0839   BILIRUBINUR Negative 03/24/2012 0420   KETONESUR Negative 03/24/2012 0420   KETONESUR NEGATIVE 05/21/2009 0839   PROTEINUR 30 mg/dL 03/24/2012 0420   PROTEINUR NEGATIVE 05/21/2009 0839   UROBILINOGEN 0.2 05/21/2009 0839   NITRITE Negative 03/24/2012 0420   NITRITE NEGATIVE 05/21/2009 0839   LEUKOCYTESUR Negative 03/24/2012 0420   Sepsis Labs: @LABRCNTIP (procalcitonin:4,lacticidven:4) ) Recent Results (from the past 240 hour(s))  Resp Panel by RT-PCR (Flu A&B, Covid) Nasopharyngeal  Swab     Status: None   Collection Time: 05/16/21  5:51 PM   Specimen: Nasopharyngeal Swab; Nasopharyngeal(NP) swabs in vial transport medium  Result Value Ref Range Status   SARS Coronavirus 2 by RT PCR NEGATIVE NEGATIVE Final    Comment: (NOTE) SARS-CoV-2 target nucleic acids are NOT DETECTED.  The SARS-CoV-2 RNA is generally detectable in upper respiratory specimens during the acute phase of infection. The lowest concentration of SARS-CoV-2 viral copies this assay can detect is 138 copies/mL. A negative result does not preclude SARS-Cov-2 infection and should not be used as the sole basis for treatment or other patient management decisions. A negative result may occur with  improper specimen collection/handling, submission of specimen other than nasopharyngeal swab, presence of viral mutation(s) within the areas targeted by this assay, and inadequate number of viral copies(<138 copies/mL). A negative result must be combined with clinical observations, patient history, and epidemiological information. The expected result is Negative.  Fact Sheet for Patients:  EntrepreneurPulse.com.au  Fact Sheet for Healthcare  Providers:  IncredibleEmployment.be  This test is no t yet approved or cleared by the [place] FDA and  has been authorized for detection and/or diagnosis of SARS-CoV-2 by FDA under an Emergency Use Authorization (EUA). This EUA will remain  in effect (meaning this test can be used) for the duration of the COVID-19 declaration under Section 564(b)(1) of the Act, 21 U.S.C.section 360bbb-3(b)(1), unless the authorization is terminated  or revoked sooner.       Influenza A by PCR NEGATIVE NEGATIVE Final   Influenza B by PCR NEGATIVE NEGATIVE Final    Comment: (NOTE) The Xpert Xpress SARS-CoV-2/FLU/RSV plus assay is intended as an aid in the diagnosis of influenza from Nasopharyngeal swab specimens and should not be used as a sole basis for treatment. Nasal washings and aspirates are unacceptable for Xpert Xpress SARS-CoV-2/FLU/RSV testing.  Fact Sheet for Patients: EntrepreneurPulse.com.au  Fact Sheet for Healthcare Providers: IncredibleEmployment.be  This test is not yet approved or cleared by the [place] FDA and has been authorized for detection and/or diagnosis of SARS-CoV-2 by FDA under an Emergency Use Authorization (EUA). This EUA will remain in effect (meaning this test can be used) for the duration of the COVID-19 declaration under Section 564(b)(1) of the Act, 21 U.S.C. section 360bbb-3(b)(1), unless the authorization is terminated or revoked.  Performed at [facility], [address] [place], [place] [address]      Radiological Exams on Admission: MR PROSTATE W WO CONTRAST  Result Date: 05/15/2021 CLINICAL DATA:  Elevated PSA in a 75 year old male, increasing over time. EXAM: MR PROSTATE WITHOUT AND WITH CONTRAST TECHNIQUE: Multiplanar multisequence MRI images were obtained of the pelvis centered about the prostate. Pre and post contrast images were obtained. CONTRAST:  85m GADAVIST  GADOBUTROL 1 MMOL/ML IV SOLN COMPARISON:  CT of the abdomen and pelvis of July of 2013 FINDINGS: Prostate: Transitional zone: Signs of BPH in the transitional zone. Area of T2 hypointensity blending with anterior fibromuscular stroma but without the typical pattern of fibromuscular stroma displays potential increased/strict diffusion and is centered in the mid to apical anterior midline tracking towards the apex (image 17/5) 14 x 6 mm. T2 hypointensity with relative homogeneous appearance and indistinct margins. PIRADS category 4 Peripheral zone: Linear and wedge-shaped areas of T2 hypointensity throughout the peripheral zone without high-risk lesion. Volume: 29 cc Transcapsular spread:  Absent Seminal vesicle involvement: Absent Neurovascular bundle involvement: Absent Pelvic adenopathy: Absent Bone metastasis: Absent Other findings: Cystic area  in the LEFT hemiscrotum measures approximately 2.8 cm and is well-circumscribed and only partially visible. This is suggested on previous CT evaluations from 2013 as well. IMPRESSION: PIRADS category 4 lesion in the mid to apical prostate in the anterior gland as described. Prostate segmented and lesion marked in DynaCAD. No evidence of extracapsular extension, seminal vesicle invasion, or metastatic disease. Cystic area in the LEFT hemiscrotum measures approximately 2.8 cm and is only partially visible. This is suggested on previous CT evaluation from 2013 as well. This may represent a seminal vesicle cyst or spermatocele. Please correlate with physical exam and history with ultrasound correlation as warranted. Electronically Signed   By: Zetta Bills M.D.   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  Will be placed on IV Solu-Medrol 1000 mg daily per ophthalmology to transition to oral prednisone 80 mg daily starting from tomorrow.  Monitor closely and supportive care.  Temporal artery biopsy recommended also by vascular surgery.  Differential is ischemic and non ischemic optic neuropathy also suspected.  #2 history of syncope: Not on any significant treatment at this point.  #3 BPH: Used to be on Flomax but not anymore.  #4 history of osteoarthritis: Previous surgeries.  Not on any medicines in the morning  #5 history of paroxysmal atrial fibrillation: Patient is not on any home medications.  We will therefore defer to his PCP.   DVT prophylaxis: Lovenox Code Status: Full code Family Communication: No family at bedside Disposition Plan: Home Consults called: Ophthalmology Dr. Wallace Going Admission status: Inpatient  Severity of Illness: The appropriate patient status for this patient is INPATIENT. Inpatient status is judged to be reasonable and necessary in order to provide the required intensity of service to ensure the patient's safety. The patient's presenting symptoms, physical exam findings, and initial radiographic and laboratory data in the context of their chronic comorbidities is felt to place them at high risk for further clinical deterioration. Furthermore, it is not anticipated that the patient will be medically stable for discharge from the hospital within 2 midnights of admission. The following factors support the patient status of inpatient.   " The patient's presenting symptoms include visual changes. " The worrisome physical exam findings include blurred vision. " The initial radiographic and laboratory data are worrisome because of elevated ESR. " The chronic co-morbidities include none.   * I certify that at the point of admission it is my clinical judgment that the patient will require inpatient hospital care spanning beyond 2 midnights from the point of  admission due to high intensity of service, high risk for further deterioration and high frequency of surveillance required.Barbette Merino MD [facility] Pager [phone]  If 7PM-7AM, please contact night-coverage [email or web address] Password [facility]  05/16/2021, 7:15 PM

## 2021-05-16 NOTE — ED Notes (Addendum)
Unable to complete visual acuity screening due to patient just having eyes dilated at eye doctor. Siadecki, MD aware.

## 2021-05-16 NOTE — ED Notes (Signed)
Patient given warm blankets. Family member at bedside.

## 2021-05-16 NOTE — Telephone Encounter (Signed)
Cardiac clearance faxed to Dr Clayborn Bigness office 05/16/21.

## 2021-05-16 NOTE — ED Notes (Signed)
Dr. Jonelle Sidle states pt. Does not need cbc redraw tonight.

## 2021-05-17 ENCOUNTER — Inpatient Hospital Stay: Payer: Medicare HMO

## 2021-05-17 DIAGNOSIS — M316 Other giant cell arteritis: Secondary | ICD-10-CM | POA: Diagnosis not present

## 2021-05-17 LAB — CBC
HCT: 38.6 % — ABNORMAL LOW (ref 39.0–52.0)
Hemoglobin: 12.6 g/dL — ABNORMAL LOW (ref 13.0–17.0)
MCH: 26.1 pg (ref 26.0–34.0)
MCHC: 32.6 g/dL (ref 30.0–36.0)
MCV: 80.1 fL (ref 80.0–100.0)
Platelets: 282 10*3/uL (ref 150–400)
RBC: 4.82 MIL/uL (ref 4.22–5.81)
RDW: 13.6 % (ref 11.5–15.5)
WBC: 7.5 10*3/uL (ref 4.0–10.5)
nRBC: 0 % (ref 0.0–0.2)

## 2021-05-17 LAB — COMPREHENSIVE METABOLIC PANEL
ALT: 17 U/L (ref 0–44)
AST: 24 U/L (ref 15–41)
Albumin: 3.3 g/dL — ABNORMAL LOW (ref 3.5–5.0)
Alkaline Phosphatase: 119 U/L (ref 38–126)
Anion gap: 9 (ref 5–15)
BUN: 15 mg/dL (ref 8–23)
CO2: 24 mmol/L (ref 22–32)
Calcium: 9.1 mg/dL (ref 8.9–10.3)
Chloride: 105 mmol/L (ref 98–111)
Creatinine, Ser: 0.81 mg/dL (ref 0.61–1.24)
GFR, Estimated: >60 mL/min (ref >60)
Glucose, Bld: 174 mg/dL — ABNORMAL HIGH (ref 70–99)
Potassium: 4.2 mmol/L (ref 3.5–5.1)
Sodium: 138 mmol/L (ref 135–145)
Total Bilirubin: 0.6 mg/dL (ref 0.3–1.2)
Total Protein: 6.3 g/dL — ABNORMAL LOW (ref 6.5–8.1)

## 2021-05-17 MED ORDER — TAMSULOSIN HCL 0.4 MG PO CAPS: 0.4000 mg | ORAL_CAPSULE | Freq: Every day | ORAL | Status: DC

## 2021-05-17 MED ORDER — OXYCODONE HCL 5 MG PO TABS: 5.0000 mg | ORAL_TABLET | ORAL | Status: DC | PRN

## 2021-05-17 MED ORDER — ACETAMINOPHEN 325 MG PO TABS: 650.0000 mg | ORAL_TABLET | Freq: Four times a day (QID) | ORAL | Status: DC | PRN

## 2021-05-17 NOTE — Progress Notes (Signed)
Patient ID: Glenn Perez, male   DOB: [birth year], 75 y.o.   MRN: [id number] F/u vision loss - presumptive GCA assoc AION OS PT reports increased va OS. Received IV solumedrol and plan to get 3 IV doses at 24 hour intervals. TA bx planned for early this week (?Monday)  VA 20/25 OD near 20/200 OS VF nml OD - superior central scotoma OS - somewhat smaller scotoma than 8/26 Pupils - no APD  A/P AION OS - highly suspicious for GCA with increased ESR and CRP  "Pre-biopsy probablility" of GCA >60% based on calculation.  One complicating factor is his recent steroid use.  Additional history: pt had tooth extracted in May.  He also had COVID in May.  Because of lingering TMJ-type symptoms of jaw pain, he was prescribed PO Prednisone which he took for about 30 days in June.  This could potentially complicate biopsy result.  Continue IV steroid w transition to PO Pred 80 mg qday. Biopsy soon  F/u with me at [facility] within 1-2 days of discharge.

## 2021-05-17 NOTE — ED Notes (Signed)
Transport called for patient to go to the room.

## 2021-05-17 NOTE — Progress Notes (Signed)
PROGRESS NOTE    Glenn Perez  ZOX:[id number] DOB: 02/29/1946 DOA: 05/16/2021 PCP: Jerrol Banana., MD    Brief Narrative:  75 y.o. male with medical history significant of history of syncope, paroxysmal atrial fibrillation, BPH who apparently was playing tennis last night when he felt blurry vision.  He went to bed and woke up this morning still blurry especially in the right eye.  He went to play tennis again this morning but his vision was getting worse.  He went to see him ophthalmologist who recommend that he comes to the ER for evaluation of temporal arteritis.  In the ER patient was seen and evaluated and found to have elevated CRP as well as sed rate.  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  Recommend admission with high-dose steroids  Patient received 1000 mg IV Solu-Medrol on the day of admission.  I evaluated the patient the following morning and stated that his vision was slightly improved from prior.  Of note the patient is also being followed by urology for elevated PSA known history of BPH.  He had a recent MRI prostate for which he is awaiting follow-up with urology  The patient will be admitted for empiric treatment of giant cell arteritis and ophthalmology and vascular surgery consultation.   Assessment & Plan:   Principal Problem:   Temporal arteritis ([place]) Active Problems:   Paroxysmal A-fib ([facility])   Pain in right knee   Benign prostatic hyperplasia without lower urinary tract symptoms  Unilateral vision loss Concern for temporal arteritis Patient presented to optometry and was referred to ophthalmology as outpatient Findings concerning for gentle arteritis considering elevated inflammatory markers ESR typically greater than 100 however currently ESR 46 does not exclude this diagnosis Plan: Solu-Medrol 1 g IV daily x3 doses Check bilateral carotid Dopplers Vascular surgery consultation on Monday for temporal artery  biopsy As needed pain control Ophthalmology follow-up  History of BPH History of elevated PSA Patient is undergoing outpatient work-up with urology.  Recently had a prostate MRI.  Will defer to urology regarding follow-up  History of PAF No medications, not anticoagulated Defer to outpatient providers     DVT prophylaxis: SQ Lovenox Code Status: Full Family Communication: Wife at bedside Disposition Plan: Status is: Inpatient  Remains inpatient appropriate because:Inpatient level of care appropriate due to severity of illness  Dispo: The patient is from: Home              Anticipated d/c is to: Home              Patient currently is not medically stable to d/c.   Difficult to place patient No   Level of care: Med-Surg  Consultants:  Ophthalmology  Procedures:  None  Antimicrobials:  None   Subjective: Seen and examined.  Wife at bedside.  Patient endorses mild improvement in left blurred vision.  Objective: Vitals:   05/17/21 0800 05/17/21 1100 05/17/21 1130 05/17/21 1200  BP: 133/75     Pulse: (!) 59 67 (!) 56 60  Resp: 16     Temp:      TempSrc:      SpO2: 98% 95% 94% 95%  Weight:      Height:        Intake/Output Summary (Last 24 hours) at 05/17/2021 1425 Last data filed at 05/17/2021 1238 Gross per 24 hour  Intake 480 ml  Output 1200 ml  Net -720 ml   Filed Weights   05/16/21 1509  Weight: 77.6 kg    Examination:  General exam: No acute distress Respiratory system: Clear to auscultation. Respiratory effort normal. Cardiovascular system: S1-S2, regular rate and rhythm, no murmurs Gastrointestinal system: Soft, nontender, nondistended, normal bowel sounds Central nervous system: Alert and oriented. No focal neurological deficits. Extremities: Symmetric 5 x 5 power. Skin: No rashes, lesions or ulcers Psychiatry: Judgement and insight appear normal. Mood & affect appropriate.     Data Reviewed: I have personally reviewed following labs  and imaging studies  CBC: Recent Labs  Lab 05/16/21 1518 05/17/21 0720  WBC 7.6 7.5  NEUTROABS 5.2  --   HGB 12.7* 12.6*  HCT 39.6 38.6*  MCV 81.1 80.1  PLT 311 419   Basic Metabolic Panel: Recent Labs  Lab 05/16/21 1518 05/17/21 0720  NA 138 138  K 4.0 4.2  CL 104 105  CO2 28 24  GLUCOSE 93 174*  BUN 13 15  CREATININE 0.92  0.94 0.81  CALCIUM 9.3 9.1   GFR: Estimated Creatinine Clearance: 89.1 mL/min (by C-G formula based on SCr of 0.81 mg/dL). Liver Function Tests: Recent Labs  Lab 05/16/21 1518 05/17/21 0720  AST 23 24  ALT 17 17  ALKPHOS 118 119  BILITOT 0.6 0.6  PROT 7.2 6.3*  ALBUMIN 3.9 3.3*   No results for input(s): LIPASE, AMYLASE in the last 168 hours. No results for input(s): AMMONIA in the last 168 hours. Coagulation Profile: No results for input(s): INR, PROTIME in the last 168 hours. Cardiac Enzymes: No results for input(s): CKTOTAL, CKMB, CKMBINDEX, TROPONINI in the last 168 hours. BNP (last 3 results) No results for input(s): PROBNP in the last 8760 hours. HbA1C: No results for input(s): HGBA1C in the last 72 hours. CBG: No results for input(s): GLUCAP in the last 168 hours. Lipid Profile: No results for input(s): CHOL, HDL, LDLCALC, TRIG, CHOLHDL, LDLDIRECT in the last 72 hours. Thyroid Function Tests: No results for input(s): TSH, T4TOTAL, FREET4, T3FREE, THYROIDAB in the last 72 hours. Anemia Panel: No results for input(s): VITAMINB12, FOLATE, FERRITIN, TIBC, IRON, RETICCTPCT in the last 72 hours. Sepsis Labs: No results for input(s): PROCALCITON, LATICACIDVEN in the last 168 hours.  Recent Results (from the past 240 hour(s))  Resp Panel by RT-PCR (Flu A&B, Covid) Nasopharyngeal Swab     Status: None   Collection Time: 05/16/21  5:51 PM   Specimen: Nasopharyngeal Swab; Nasopharyngeal(NP) swabs in vial transport medium  Result Value Ref Range Status   SARS Coronavirus 2 by RT PCR NEGATIVE NEGATIVE Final    Comment:  (NOTE) SARS-CoV-2 target nucleic acids are NOT DETECTED.  The SARS-CoV-2 RNA is generally detectable in upper respiratory specimens during the acute phase of infection. The lowest concentration of SARS-CoV-2 viral copies this assay can detect is 138 copies/mL. A negative result does not preclude SARS-Cov-2 infection and should not be used as the sole basis for treatment or other patient management decisions. A negative result may occur with  improper specimen collection/handling, submission of specimen other than nasopharyngeal swab, presence of viral mutation(s) within the areas targeted by this assay, and inadequate number of viral copies(<138 copies/mL). A negative result must be combined with clinical observations, patient history, and epidemiological information. The expected result is Negative.  Fact Sheet for Patients:  EntrepreneurPulse.com.au  Fact Sheet for Healthcare Providers:  IncredibleEmployment.be  This test is no t yet approved or cleared by the [place] FDA and  has been authorized for detection and/or diagnosis of SARS-CoV-2 by FDA under an Emergency  Use Authorization (EUA). This EUA will remain  in effect (meaning this test can be used) for the duration of the COVID-19 declaration under Section 564(b)(1) of the Act, 21 U.S.C.section 360bbb-3(b)(1), unless the authorization is terminated  or revoked sooner.       Influenza A by PCR NEGATIVE NEGATIVE Final   Influenza B by PCR NEGATIVE NEGATIVE Final    Comment: (NOTE) The Xpert Xpress SARS-CoV-2/FLU/RSV plus assay is intended as an aid in the diagnosis of influenza from Nasopharyngeal swab specimens and should not be used as a sole basis for treatment. Nasal washings and aspirates are unacceptable for Xpert Xpress SARS-CoV-2/FLU/RSV testing.  Fact Sheet for Patients: EntrepreneurPulse.com.au  Fact Sheet for Healthcare  Providers: IncredibleEmployment.be  This test is not yet approved or cleared by the [place] FDA and has been authorized for detection and/or diagnosis of SARS-CoV-2 by FDA under an Emergency Use Authorization (EUA). This EUA will remain in effect (meaning this test can be used) for the duration of the COVID-19 declaration under Section 564(b)(1) of the Act, 21 U.S.C. section 360bbb-3(b)(1), unless the authorization is terminated or revoked.  Performed at [facility], [address] [place], [place] [address]          Radiology Studies: US Carotid Bilateral  Result Date: 05/17/2021 CLINICAL DATA:  Vision loss, syncope EXAM: BILATERAL CAROTID DUPLEX ULTRASOUND TECHNIQUE: Pearline Cables scale imaging, color Doppler and duplex ultrasound were performed of bilateral carotid and vertebral arteries in the neck. COMPARISON:  03/27/2014 FINDINGS: Criteria: Quantification of carotid stenosis is based on velocity parameters that correlate the residual internal carotid diameter with NASCET-based stenosis levels, using the diameter of the distal internal carotid lumen as the denominator for stenosis measurement. The following velocity measurements were obtained: RIGHT ICA: 134/23 cm/sec CCA: 292/44 cm/sec SYSTOLIC ICA/CCA RATIO:  1.3 ECA: 146 cm/sec LEFT ICA: 91/23 cm/sec CCA: 628/63 cm/sec SYSTOLIC ICA/CCA RATIO:  0.9 ECA: 144 cm/sec RIGHT CAROTID ARTERY: Eccentric noncalcified plaque in the bulb and proximal ICA normal waveforms and color Doppler signal throughout. Mild tortuosity. Resulting in mild stenosis. RIGHT VERTEBRAL ARTERY:  Normal flow direction and waveform. LEFT CAROTID ARTERY: Mild noncalcified plaque in the bulb, proximal ICA, and ECA origin with only mild stenosis. Normal waveforms and color Doppler signal throughout. LEFT VERTEBRAL ARTERY:  Normal flow direction and waveform. IMPRESSION: 1. Bilateral carotid bifurcation plaque resulting in less than 50% diameter  ICA stenosis. 2. Antegrade bilateral vertebral arterial flow. Electronically Signed   By: Lucrezia Europe M.D.   On: 05/17/2021 09:38        Scheduled Meds:  enoxaparin (LOVENOX) injection  40 mg Subcutaneous Q24H   tamsulosin  0.4 mg Oral QPC supper   Continuous Infusions:  methylPREDNISolone (SOLU-MEDROL) injection       LOS: 1 day    Time spent: 25 minutes    Sidney Ace, MD [facility] Pager [phone]  If 7PM-7AM, please contact night-coverage 05/17/2021, 2:25 PM

## 2021-05-17 NOTE — ED Notes (Signed)
US at bedside

## 2021-05-17 NOTE — Plan of Care (Signed)
Continuing with plan of care. 

## 2021-05-17 NOTE — ED Notes (Signed)
Patient is eating lunch, waiting for room to be cleaned.

## 2021-05-17 NOTE — ED Notes (Signed)
Hospitalist at bedside 

## 2021-05-17 NOTE — ED Notes (Signed)
Family at bedside. 

## 2021-05-17 NOTE — ED Notes (Signed)
Lab at bedside

## 2021-05-18 DIAGNOSIS — M316 Other giant cell arteritis: Secondary | ICD-10-CM | POA: Diagnosis not present

## 2021-05-18 LAB — SEDIMENTATION RATE: Sed Rate: 33 mm/hr — ABNORMAL HIGH (ref 0–20)

## 2021-05-18 LAB — C-REACTIVE PROTEIN: CRP: 1.9 mg/dL — ABNORMAL HIGH (ref <1.0)

## 2021-05-18 MED ORDER — SODIUM CHLORIDE 0.9 % IV SOLN
1000.0000 mg | Freq: Every day | INTRAVENOUS | Status: DC
  Administered 2021-05-18: 1000 mg via INTRAVENOUS
  Filled 2021-05-18 (×2): qty 8

## 2021-05-18 NOTE — Plan of Care (Signed)
Continuing with plan of care. 

## 2021-05-18 NOTE — Progress Notes (Signed)
05/18/21 1335  Clinical Encounter Type  Visited With Patient  Visit Type Initial;Spiritual support;Social support  Referral From Other (Comment) (other staff)  Consult/Referral To Chaplain  Spiritual Encounters  Spiritual Needs Prayer;Emotional;Other (Comment) (difficult prognosis, uncertain)  Chaplain Burris met with Pt who is facing an uncertain outcome and potentially challenging diagnosis/prognosis that impacts his vision. Chaplain Burris provided reflective listening and supportive presence. Visit interrupted by a phone call so will attempt to return for prayer together later this evening.

## 2021-05-18 NOTE — Progress Notes (Signed)
PROGRESS NOTE    Glenn Perez  NGE:[id number] DOB: [birth year] DOA: 05/16/2021 PCP: Jerrol Banana., MD    Brief Narrative:  75 y.o. male with medical history significant of history of syncope, paroxysmal atrial fibrillation, BPH who apparently was playing tennis last night when he felt blurry vision.  He went to bed and woke up this morning still blurry especially in the right eye.  He went to play tennis again this morning but his vision was getting worse.  He went to see him ophthalmologist who recommend that he comes to the ER for evaluation of temporal arteritis.  In the ER patient was seen and evaluated and found to have elevated CRP as well as sed rate.  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  Recommend admission with high-dose steroids  Patient received 1000 mg IV Solu-Medrol on the day of admission.  I evaluated the patient the following morning and stated that his vision was slightly improved from prior.  Of note the patient is also being followed by urology for elevated PSA known history of BPH.  He had a recent MRI prostate for which he is awaiting follow-up with urology.  The patient will be admitted for empiric treatment of giant cell arteritis and ophthalmology and vascular surgery consultation.   Assessment & Plan:   Principal Problem:   Temporal arteritis ([place]) Active Problems:   Paroxysmal A-fib ([facility])   Pain in right knee   Benign prostatic hyperplasia without lower urinary tract symptoms  Unilateral vision loss Concern for temporal arteritis Patient presented to optometry and was referred to ophthalmology as outpatient Findings concerning for gentle arteritis considering elevated inflammatory markers ESR typically greater than 100 however currently ESR 46 does not exclude this diagnosis -Carotid Doppler less than 50% hemodynamic stenosis Plan: Continue pulse dose steroids, 1 g Solu-Medrol IV daily x3 days Will make  n.p.o. after midnight We will reach out to vascular surgery in a.m. for consideration of temporal artery biopsy As needed pain control Ophthalmology follow-up Rheumatology outpatient follow-up  History of BPH History of elevated PSA Patient is undergoing outpatient work-up with urology.  Recently had a prostate MRI.   Outpatient follow-up with urology being arranged  History of PAF No medications, not anticoagulated Defer to outpatient providers     DVT prophylaxis: SQ Lovenox Code Status: Full Family Communication: Wife at bedside Disposition Plan: Status is: Inpatient  Remains inpatient appropriate because:Inpatient level of care appropriate due to severity of illness  Dispo: The patient is from: Home              Anticipated d/c is to: Home              Patient currently is not medically stable to d/c.   Difficult to place patient No   Level of care: Med-Surg  Consultants:  Ophthalmology  Procedures:  None  Antimicrobials:  None   Subjective: Seen and examined.  Wife at bedside.  Stability of visual impairment over the last 24 hours  Objective: Vitals:   05/17/21 1430 05/17/21 1446 05/17/21 1936 05/18/21 0404  BP:  125/67 136/82 123/63  Pulse: 63 67 60 64  Resp:  _0 Temp:  97.6 F (36.4 C) 97.8 F (36.6 C) 97.7 F (36.5 C)  TempSrc:  Oral Oral Oral  SpO2: 97% 97% 97% 97%  Weight:      Height:        Intake/Output Summary (Last 24 hours) at 05/18/2021 1052 Last  data filed at 05/17/2021 1700 Gross per 24 hour  Intake 240 ml  Output 250 ml  Net -10 ml   Filed Weights   05/16/21 1509  Weight: 77.6 kg    Examination:  General exam: No acute distress Respiratory system: Clear to auscultation. Respiratory effort normal. Cardiovascular system: S1-S2, regular rate and rhythm, no murmurs Gastrointestinal system: Soft, nontender, nondistended, normal bowel sounds Central nervous system: Alert and oriented. No focal neurological  deficits. Extremities: Symmetric 5 x 5 power. Skin: No rashes, lesions or ulcers Psychiatry: Judgement and insight appear normal. Mood & affect appropriate.     Data Reviewed: I have personally reviewed following labs and imaging studies  CBC: Recent Labs  Lab 05/16/21 1518 05/17/21 0720  WBC 7.6 7.5  NEUTROABS 5.2  --   HGB 12.7* 12.6*  HCT 39.6 38.6*  MCV 81.1 80.1  PLT 311 295   Basic Metabolic Panel: Recent Labs  Lab 05/16/21 1518 05/17/21 0720  NA 138 138  K 4.0 4.2  CL 104 105  CO2 28 24  GLUCOSE 93 174*  BUN 13 15  CREATININE 0.92  0.94 0.81  CALCIUM 9.3 9.1   GFR: Estimated Creatinine Clearance: 89.1 mL/min (by C-G formula based on SCr of 0.81 mg/dL). Liver Function Tests: Recent Labs  Lab 05/16/21 1518 05/17/21 0720  AST 23 24  ALT 17 17  ALKPHOS 118 119  BILITOT 0.6 0.6  PROT 7.2 6.3*  ALBUMIN 3.9 3.3*   No results for input(s): LIPASE, AMYLASE in the last 168 hours. No results for input(s): AMMONIA in the last 168 hours. Coagulation Profile: No results for input(s): INR, PROTIME in the last 168 hours. Cardiac Enzymes: No results for input(s): CKTOTAL, CKMB, CKMBINDEX, TROPONINI in the last 168 hours. BNP (last 3 results) No results for input(s): PROBNP in the last 8760 hours. HbA1C: No results for input(s): HGBA1C in the last 72 hours. CBG: No results for input(s): GLUCAP in the last 168 hours. Lipid Profile: No results for input(s): CHOL, HDL, LDLCALC, TRIG, CHOLHDL, LDLDIRECT in the last 72 hours. Thyroid Function Tests: No results for input(s): TSH, T4TOTAL, FREET4, T3FREE, THYROIDAB in the last 72 hours. Anemia Panel: No results for input(s): VITAMINB12, FOLATE, FERRITIN, TIBC, IRON, RETICCTPCT in the last 72 hours. Sepsis Labs: No results for input(s): PROCALCITON, LATICACIDVEN in the last 168 hours.  Recent Results (from the past 240 hour(s))  Resp Panel by RT-PCR (Flu A&B, Covid) Nasopharyngeal Swab     Status: None    Collection Time: 05/16/21  5:51 PM   Specimen: Nasopharyngeal Swab; Nasopharyngeal(NP) swabs in vial transport medium  Result Value Ref Range Status   SARS Coronavirus 2 by RT PCR NEGATIVE NEGATIVE Final    Comment: (NOTE) SARS-CoV-2 target nucleic acids are NOT DETECTED.  The SARS-CoV-2 RNA is generally detectable in upper respiratory specimens during the acute phase of infection. The lowest concentration of SARS-CoV-2 viral copies this assay can detect is 138 copies/mL. A negative result does not preclude SARS-Cov-2 infection and should not be used as the sole basis for treatment or other patient management decisions. A negative result may occur with  improper specimen collection/handling, submission of specimen other than nasopharyngeal swab, presence of viral mutation(s) within the areas targeted by this assay, and inadequate number of viral copies(<138 copies/mL). A negative result must be combined with clinical observations, patient history, and epidemiological information. The expected result is Negative.  Fact Sheet for Patients:  EntrepreneurPulse.com.au  Fact Sheet for Healthcare Providers:  IncredibleEmployment.be  This test is no t yet approved or cleared by the [place] and  has been authorized for detection and/or diagnosis of SARS-CoV-2 by FDA under an Emergency Use Authorization (EUA). This EUA will remain  in effect (meaning this test can be used) for the duration of the COVID-19 declaration under Section 564(b)(1) of the Act, 21 U.S.C.section 360bbb-3(b)(1), unless the authorization is terminated  or revoked sooner.       Influenza A by PCR NEGATIVE NEGATIVE Final   Influenza B by PCR NEGATIVE NEGATIVE Final    Comment: (NOTE) The Xpert Xpress SARS-CoV-2/FLU/RSV plus assay is intended as an aid in the diagnosis of influenza from Nasopharyngeal swab specimens and should not be used as a sole basis for treatment.  Nasal washings and aspirates are unacceptable for Xpert Xpress SARS-CoV-2/FLU/RSV testing.  Fact Sheet for Patients: EntrepreneurPulse.com.au  Fact Sheet for Healthcare Providers: IncredibleEmployment.be  This test is not yet approved or cleared by the [place] FDA and has been authorized for detection and/or diagnosis of SARS-CoV-2 by FDA under an Emergency Use Authorization (EUA). This EUA will remain in effect (meaning this test can be used) for the duration of the COVID-19 declaration under Section 564(b)(1) of the Act, 21 U.S.C. section 360bbb-3(b)(1), unless the authorization is terminated or revoked.  Performed at [facility], [address], [place], [place] [address]          Radiology Studies: US Carotid Bilateral  Result Date: 05/17/2021 CLINICAL DATA:  Vision loss, syncope EXAM: BILATERAL CAROTID DUPLEX ULTRASOUND TECHNIQUE: Pearline Cables scale imaging, color Doppler and duplex ultrasound were performed of bilateral carotid and vertebral arteries in the neck. COMPARISON:  03/27/2014 FINDINGS: Criteria: Quantification of carotid stenosis is based on velocity parameters that correlate the residual internal carotid diameter with NASCET-based stenosis levels, using the diameter of the distal internal carotid lumen as the denominator for stenosis measurement. The following velocity measurements were obtained: RIGHT ICA: 134/23 cm/sec CCA: 322/02 cm/sec SYSTOLIC ICA/CCA RATIO:  1.3 ECA: 146 cm/sec LEFT ICA: 91/23 cm/sec CCA: 542/70 cm/sec SYSTOLIC ICA/CCA RATIO:  0.9 ECA: 144 cm/sec RIGHT CAROTID ARTERY: Eccentric noncalcified plaque in the bulb and proximal ICA normal waveforms and color Doppler signal throughout. Mild tortuosity. Resulting in mild stenosis. RIGHT VERTEBRAL ARTERY:  Normal flow direction and waveform. LEFT CAROTID ARTERY: Mild noncalcified plaque in the bulb, proximal ICA, and ECA origin with only mild stenosis. Normal  waveforms and color Doppler signal throughout. LEFT VERTEBRAL ARTERY:  Normal flow direction and waveform. IMPRESSION: 1. 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 2. Antegrade bilateral vertebral arterial flow. Electronically Signed   By: Lucrezia Europe M.D.   On: 05/17/2021 09:38        Scheduled Meds:  enoxaparin (LOVENOX) injection  40 mg Subcutaneous Q24H   Continuous Infusions:  methylPREDNISolone (SOLU-MEDROL) injection       LOS: 2 days    Time spent: 25 minutes    Sidney Ace, MD [facility] Pager [phone]  If 7PM-7AM, please contact night-coverage 05/18/2021, 10:52 AM

## 2021-05-19 ENCOUNTER — Other Ambulatory Visit (INDEPENDENT_AMBULATORY_CARE_PROVIDER_SITE_OTHER): Payer: Self-pay | Admitting: Vascular Surgery

## 2021-05-19 DIAGNOSIS — M316 Other giant cell arteritis: Secondary | ICD-10-CM | POA: Diagnosis not present

## 2021-05-19 LAB — C-REACTIVE PROTEIN: CRP: 1 mg/dL — ABNORMAL HIGH (ref <1.0)

## 2021-05-19 LAB — SEDIMENTATION RATE: Sed Rate: 23 mm/hr — ABNORMAL HIGH (ref 0–20)

## 2021-05-19 MED ORDER — PREDNISONE 20 MG PO TABS
80.0000 mg | ORAL_TABLET | Freq: Every day | ORAL | Status: DC
  Administered 2021-05-20: 80 mg via ORAL
  Filled 2021-05-19: qty 4

## 2021-05-19 MED ORDER — CHLORHEXIDINE GLUCONATE CLOTH 2 % EX PADS: 6.0000 | MEDICATED_PAD | Freq: Once | CUTANEOUS | Status: DC

## 2021-05-19 MED ORDER — SODIUM CHLORIDE 0.9 % IV SOLN
1000.0000 mg | Freq: Once | INTRAVENOUS | Status: AC
  Administered 2021-05-19: 1000 mg via INTRAVENOUS
  Filled 2021-05-19 (×2): qty 8

## 2021-05-19 MED ORDER — CHLORHEXIDINE GLUCONATE CLOTH 2 % EX PADS
6.0000 | MEDICATED_PAD | Freq: Once | CUTANEOUS | Status: DC
  Administered 2021-05-20: 6 via TOPICAL

## 2021-05-19 MED ORDER — SODIUM CHLORIDE 0.9 % IV SOLN: INTRAVENOUS | Status: DC

## 2021-05-19 MED ORDER — CEFAZOLIN SODIUM-DEXTROSE 2-4 GM/100ML-% IV SOLN
2.0000 g | INTRAVENOUS | Status: AC
  Administered 2021-05-20: 2 g via INTRAVENOUS
  Filled 2021-05-19: qty 100

## 2021-05-19 NOTE — Care Management Important Message (Signed)
Important Message  Patient Details  Name: Glenn Perez MRN: [id number] Date of Birth: 02/06/1946   Medicare Important Message Given:  N/A - LOS <3 / Initial given by admissions  Initial Medicare IM reviewed with patient by Wendall Stade, Patient Access Associate on 05/18/2021 at 11:16am.   Dannette Barbara 05/19/2021, 9:46 AM

## 2021-05-19 NOTE — H&P (View-Only) (Signed)
[facility] Vascular Consult Note  MRN : [id number]  Glenn Perez is a 75 y.o. ([birth year]) male who presents with chief complaint of  Chief Complaint  Patient presents with   Blurred Vision   History of Present Illness:  Glenn Perez is a 75 y.o. male with medical history significant of history of syncope, paroxysmal atrial fibrillation, BPH who apparently was playing tennis last night when he felt blurry vision.    He went to bed and woke up this morning still blurry especially in the right eye.  He went to play tennis again this morning but his vision was getting worse.  He went to see him ophthalmologist who recommend that he comes to the ER for evaluation of temporal arteritis.  In the ER patient was seen and evaluated and found to have elevated CRP as well as sed rate.  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  Recommend admission with high-dose steroids.  Medical admission requested for.  Patient denied any headaches.  He still blurry in his vision.  No prior diagnosis of visual changes or temporal arteritis.  He is little bit fatigued.  Denied any tenderness over the TMJ symptoms.  He apparently had TMJ symptoms back in June when he had tooth extraction but then got prednisone.  Patient is being followed by urology for elevated PSA and known history of BPH.  At this point patient is being admitted to the hospital for evaluation and treatment..   ED Course: Temperature is 97.7 blood pressure 128/64, pulse 58, respirate of 18 and oxygen sats 98% on room air.  White count 7.6 hemoglobin 12.7 platelets 311.  The rest of the chemistry appeared to be within normal.  Sed rate is 46.  Respiratory panel currently pending.  Ophthalmology assessment has been done and patient is being admitted with suspicion for temporal arteritis.  Vascular surgery was consulted by Dr. Priscella Mann for possible temporal artery biopsy.  Current  Facility-Administered Medications  Medication Dose Route Frequency Provider Last Rate Last Admin   0.9 %  sodium chloride infusion   Intravenous Continuous Ralene Muskrat B, MD 75 mL/hr at 05/19/21 0907 New Bag at 05/19/21 0907   acetaminophen (TYLENOL) tablet 650 mg  650 mg Oral Q6H PRN Ralene Muskrat B, MD       methylPREDNISolone sodium succinate (SOLU-MEDROL) 1,000 mg in sodium chloride 0.9 % 50 mL IVPB  1,000 mg Intravenous Daily Ralene Muskrat B, MD   Stopped at 05/18/21 1340   ondansetron (ZOFRAN) tablet 4 mg  4 mg Oral Q6H PRN Elwyn Reach, MD       Or   ondansetron (ZOFRAN) injection 4 mg  4 mg Intravenous Q6H PRN Garba, Mohammad L, MD       oxyCODONE (Oxy IR/ROXICODONE) immediate release tablet 5 mg  5 mg Oral Q4H PRN Sidney Ace, MD       Past Medical History:  Diagnosis Date   Syncope    Past Surgical History:  Procedure Laterality Date   APPENDECTOMY     KNEE SURGERY     NOSE SURGERY     x 2.   REPLACEMENT TOTAL KNEE     SHOULDER SURGERY  10/2015   for partially torn rotator cuff and detached bicep tendon.   Social History Social History   Tobacco Use   Smoking status: Never   Smokeless tobacco: Never  Vaping Use   Vaping Use: Never used  Substance Use Topics  Alcohol use: Yes    Alcohol/week: 1.0 standard drink    Types: 1 Standard drinks or equivalent per week   Drug use: No   Family History Family History  Problem Relation Age of Onset   Diverticulosis Mother    Cancer Brother        unsure of what kind  Denies family history of peripheral artery disease, venous disease or autoimmune disease  No Known Allergies  REVIEW OF SYSTEMS (Negative unless checked)  Constitutional: [] Weight loss  [] Fever  [] Chills Cardiac: [] Chest pain   [] Chest pressure   [] Palpitations   [] Shortness of breath when laying flat   [] Shortness of breath at rest   [] Shortness of breath with exertion. Vascular:  [] Pain in legs with walking   [] Pain in legs  at rest   [] Pain in legs when laying flat   [] Claudication   [] Pain in feet when walking  [] Pain in feet at rest  [] Pain in feet when laying flat   [] History of DVT   [] Phlebitis   [] Swelling in legs   [] Varicose veins   [] Non-healing ulcers Pulmonary:   [] Uses home oxygen   [] Productive cough   [] Hemoptysis   [] Wheeze  [] COPD   [] Asthma Neurologic:  [] Dizziness  [] Blackouts   [] Seizures   [] History of stroke   [] History of TIA  [] Aphasia   [] Temporary blindness   [] Dysphagia   [] Weakness or numbness in arms   [] Weakness or numbness in legs Musculoskeletal:  [] Arthritis   [] Joint swelling   [] Joint pain   [] Low back pain Hematologic:  [] Easy bruising  [] Easy bleeding   [] Hypercoagulable state   [] Anemic  [] Hepatitis Gastrointestinal:  [] Blood in stool   [] Vomiting blood  [] Gastroesophageal reflux/heartburn   [] Difficulty swallowing. Genitourinary:  [] Chronic kidney disease   [] Difficult urination  [] Frequent urination  [] Burning with urination   [] Blood in urine Skin:  [] Rashes   [] Ulcers   [] Wounds Psychological:  [] History of anxiety   []  History of major depression.  Positive for visual disturbances  Physical Examination  Vitals:   05/18/21 1311 05/18/21 1810 05/18/21 2050 05/19/21 0401  BP: 128/65 129/60 (!) 143/66 (!) 143/73  Pulse: (!) 56 (!) 57 (!) 53 (!) 47  Resp: 20 20 17 17   Temp: (!) 97.4 F (36.3 C) 97.9 F (36.6 C)  (!) 97.5 F (36.4 C)  TempSrc: Oral Oral  Oral  SpO2: 98% 97% 99% 97%  Weight:      Height:       Body mass index is 23.19 kg/m. Gen:  WD/WN, NAD Head: [place]/AT, No temporalis wasting. Prominent temp pulse not noted. Ear/Nose/Throat: Hearing grossly intact, nares w/o erythema or drainage, oropharynx w/o Erythema/Exudate Eyes: Sclera non-icteric, conjunctiva clear Neck: Trachea midline.  No JVD.  Pulmonary:  Good air movement, respirations not labored, equal bilaterally.  Cardiac: RRR, normal S1, S2. Vascular:  Vessel Right Left  Radial Palpable Palpable   Ulnar Palpable Palpable  Brachial Palpable Palpable  Carotid Palpable, without bruit Palpable, without bruit  Aorta Not palpable N/A  Femoral Palpable Palpable  Popliteal Palpable Palpable  PT Palpable Palpable  DP Palpable Palpable   Gastrointestinal: soft, non-tender/non-distended. No guarding/reflex.  Musculoskeletal: M/S 5/5 throughout.  Extremities without ischemic changes.  No deformity or atrophy. No edema. Neurologic: Sensation grossly intact in extremities.  Symmetrical.  Speech is fluent. Motor exam as listed above. Psychiatric: Judgment intact, Mood & affect appropriate for pt's clinical situation. Dermatologic: No rashes or ulcers noted.  No cellulitis or open wounds. Lymph :  No Cervical, Axillary, or Inguinal lymphadenopathy.  CBC Lab Results  Component Value Date   WBC 7.5 05/17/2021   HGB 12.6 (L) 05/17/2021   HCT 38.6 (L) 05/17/2021   MCV 80.1 05/17/2021   PLT 282 05/17/2021   BMET    Component Value Date/Time   NA 138 05/17/2021 0720   NA 141 03/12/2021 1626   NA 137 01/03/2014 1344   K 4.2 05/17/2021 0720   K 3.9 01/03/2014 1344   CL 105 05/17/2021 0720   CL 104 01/03/2014 1344   CO2 24 05/17/2021 0720   CO2 30 01/03/2014 1344   GLUCOSE 174 (H) 05/17/2021 0720   GLUCOSE 95 01/03/2014 1344   BUN 15 05/17/2021 0720   BUN 13 03/12/2021 1626   BUN 15 01/03/2014 1344   CREATININE 0.81 05/17/2021 0720   CREATININE 0.90 01/03/2014 1344   CALCIUM 9.1 05/17/2021 0720   CALCIUM 8.7 01/03/2014 1344   GFRNONAA >60 05/17/2021 0720   GFRNONAA >60 01/03/2014 1344   GFRAA 103 04/19/2019 0000   GFRAA >60 01/03/2014 1344   Estimated Creatinine Clearance: 89.1 mL/min (by C-G formula based on SCr of 0.81 mg/dL).  COAG Lab Results  Component Value Date   INR 1.0 04/19/2019   INR 1.7 (H) 06/01/2009   INR 2.1 (H) 05/31/2009   Radiology US Carotid Bilateral  Result Date: 05/17/2021 CLINICAL DATA:  Vision loss, syncope EXAM: BILATERAL CAROTID DUPLEX  ULTRASOUND TECHNIQUE: Pearline Cables scale imaging, color Doppler and duplex ultrasound were performed of bilateral carotid and vertebral arteries in the neck. COMPARISON:  03/27/2014 FINDINGS: Criteria: Quantification of carotid stenosis is based on velocity parameters that correlate the residual internal carotid diameter with NASCET-based stenosis levels, using the diameter of the distal internal carotid lumen as the denominator for stenosis measurement. The following velocity measurements were obtained: RIGHT ICA: 134/23 cm/sec CCA: 220/25 cm/sec SYSTOLIC ICA/CCA RATIO:  1.3 ECA: 146 cm/sec LEFT ICA: 91/23 cm/sec CCA: 427/06 cm/sec SYSTOLIC ICA/CCA RATIO:  0.9 ECA: 144 cm/sec RIGHT CAROTID ARTERY: Eccentric noncalcified plaque in the bulb and proximal ICA normal waveforms and color Doppler signal throughout. Mild tortuosity. Resulting in mild stenosis. RIGHT VERTEBRAL ARTERY:  Normal flow direction and waveform. LEFT CAROTID ARTERY: Mild noncalcified plaque in the bulb, proximal ICA, and ECA origin with only mild stenosis. Normal waveforms and color Doppler signal throughout. LEFT VERTEBRAL ARTERY:  Normal flow direction and waveform. IMPRESSION: 1. 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 2. Antegrade bilateral vertebral arterial flow. Electronically Signed   By: Lucrezia Europe M.D.   On: 05/17/2021 09:38   MR PROSTATE W WO CONTRAST  Result Date: 05/15/2021 CLINICAL DATA:  Elevated PSA in a 75 year old male, increasing over time. EXAM: MR PROSTATE WITHOUT AND WITH CONTRAST TECHNIQUE: Multiplanar multisequence MRI images were obtained of the pelvis centered about the prostate. Pre and post contrast images were obtained. CONTRAST:  60m GADAVIST GADOBUTROL 1 MMOL/ML IV SOLN COMPARISON:  CT of the abdomen and pelvis of July of 2013 FINDINGS: Prostate: Transitional zone: Signs of BPH in the transitional zone. Area of T2 hypointensity blending with anterior fibromuscular stroma but  without the typical pattern of fibromuscular stroma displays potential increased/strict diffusion and is centered in the mid to apical anterior midline tracking towards the apex (image 17/5) 14 x 6 mm. T2 hypointensity with relative homogeneous appearance and indistinct margins. PIRADS category 4 Peripheral zone: Linear and wedge-shaped areas of T2 hypointensity throughout the peripheral zone without high-risk lesion. Volume: 29 cc  Transcapsular spread:  Absent Seminal vesicle involvement: Absent Neurovascular bundle involvement: Absent Pelvic adenopathy: Absent Bone metastasis: Absent Other findings: Cystic area in the LEFT hemiscrotum measures approximately 2.8 cm and is well-circumscribed and only partially visible. This is suggested on previous CT evaluations from 2013 as well. IMPRESSION: PIRADS category 4 lesion in the mid to apical prostate in the anterior gland as described. Prostate segmented and lesion marked in DynaCAD. No evidence of extracapsular extension, seminal vesicle invasion, or metastatic disease. Cystic area in the LEFT hemiscrotum measures approximately 2.8 cm and is only partially visible. This is suggested on previous CT evaluation from 2013 as well. This may represent a seminal vesicle cyst or spermatocele. Please correlate with physical exam and history with ultrasound correlation as warranted. Electronically Signed   By: Zetta Bills M.D.   On: 05/15/2021 16:43    Assessment/Plan The patient is a 75 year old male with past medical history of A. fib, BPH, status post left total knee replacement patient presents to the [facility]'s emergency department with progressively worsening blurry vision.  1.  Possible temporal arteritis: Patient presented to the [facility] emergency department after progressively worsening "blurred" vision".  Patient was seen by ophthalmology in the field given his elevated CRP / ESR and have a strong suspicion for  giant cell arteriolitis and are recommending temporal artery biopsy.  Patient has been started on steroids.  Procedure, risk and benefits of temporal artery biopsy were explained to the patient and his wife who is at the bedside.  All questions were answered.  We will plan on this either today or tomorrow depending on or availability  2.  Atrial fibrillation: Denies any chest pain. Currently, not on any anticoagulation  3.  BPH: Asymptomatic at this time On Flomax  Discussed with Dr. Mayme Genta, PA-C 05/19/2021 11:53 AM  This note was created with Dragon medical transcription system.  Any error is purely unintentional.

## 2021-05-19 NOTE — Progress Notes (Signed)
PROGRESS NOTE    Glenn Perez  QQP:[id number] DOB: 04-12-1946 DOA: 05/16/2021 PCP: Jerrol Banana., MD    Brief Narrative:  75 y.o. male with medical history significant of history of syncope, paroxysmal atrial fibrillation, BPH who apparently was playing tennis last night when he felt blurry vision.  He went to bed and woke up this morning still blurry especially in the right eye.  He went to play tennis again this morning but his vision was getting worse.  He went to see him ophthalmologist who recommend that he comes to the ER for evaluation of temporal arteritis.  In the ER patient was seen and evaluated and found to have elevated CRP as well as sed rate.  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  Recommend admission with high-dose steroids  Patient received 1000 mg IV Solu-Medrol on the day of admission.  I evaluated the patient the following morning and stated that his vision was slightly improved from prior.  Of note the patient is also being followed by urology for elevated PSA known history of BPH.  He had a recent MRI prostate for which he is awaiting follow-up with urology.  The patient will be admitted for empiric treatment of giant cell arteritis and ophthalmology and vascular surgery consultation.  Patient did receive high-dose steroids since admission.  1 g Solu-Medrol daily.  Vascular surgery contacted on 8/29 to consider right temporal artery biopsy.   Assessment & Plan:   Principal Problem:   Temporal arteritis ([place]) Active Problems:   Paroxysmal A-fib ([facility])   Pain in right knee   Benign prostatic hyperplasia without lower urinary tract symptoms  Unilateral vision loss Concern for temporal arteritis Patient presented to optometry and was referred to ophthalmology as outpatient Findings concerning for gentle arteritis considering elevated inflammatory markers ESR typically greater than 100 however currently ESR 46 does  not exclude this diagnosis -Carotid Doppler less than 50% hemodynamic stenosis Plan: Continue 1 g Solu-Medrol IV for today Transition to p.o. prednisone 80 mg daily starting tomorrow Vascular surgery consulted for temporal artery biopsy Consultation appreciated, plan for procedure either today or tomorrow Will need ophthalmology and rheumatology follow-up at time of discharge  History of BPH History of elevated PSA Patient is undergoing outpatient work-up with urology.  Recently had a prostate MRI.   Outpatient follow-up with urology being arranged  History of PAF No medications, not anticoagulated Defer to outpatient providers     DVT prophylaxis: SQ Lovenox Code Status: Full Family Communication: Wife at bedside Disposition Plan: Status is: Inpatient  Remains inpatient appropriate because:Inpatient level of care appropriate due to severity of illness  Dispo: The patient is from: Home              Anticipated d/c is to: Home              Patient currently is not medically stable to d/c.   Difficult to place patient No   Level of care: Med-Surg  Consultants:  Ophthalmology  Procedures:  None  Antimicrobials:  None   Subjective: Seen and examined.  Hemodynamically stable.  No pain complaints.  Frustrated and understandably upset about visual loss.  Objective: Vitals:   05/18/21 1311 05/18/21 1810 05/18/21 2050 05/19/21 0401  BP: 128/65 129/60 (!) 143/66 (!) 143/73  Pulse: (!) 56 (!) 57 (!) 53 (!) 47  Resp: 20 20 17 17   Temp: (!) 97.4 F (36.3 C) 97.9 F (36.6 C)  (!) 97.5 F (36.4 C)  TempSrc: Oral Oral  Oral  SpO2: 98% 97% 99% 97%  Weight:      Height:        Intake/Output Summary (Last 24 hours) at 05/19/2021 1206 Last data filed at 05/18/2021 1600 Gross per 24 hour  Intake 50.98 ml  Output --  Net 50.98 ml   Filed Weights   05/16/21 1509  Weight: 77.6 kg    Examination:  General exam: No acute distress Respiratory system: Clear to  auscultation. Respiratory effort normal. Cardiovascular system: S1-S2, regular rate and rhythm, no murmurs Gastrointestinal system: Soft, nontender, nondistended, normal bowel sounds Central nervous system: Alert and oriented. No focal neurological deficits. Extremities: Symmetric 5 x 5 power. Skin: No rashes, lesions or ulcers Psychiatry: Judgement and insight appear normal. Mood & affect appropriate.     Data Reviewed: I have personally reviewed following labs and imaging studies  CBC: Recent Labs  Lab 05/16/21 1518 05/17/21 0720  WBC 7.6 7.5  NEUTROABS 5.2  --   HGB 12.7* 12.6*  HCT 39.6 38.6*  MCV 81.1 80.1  PLT 311 675   Basic Metabolic Panel: Recent Labs  Lab 05/16/21 1518 05/17/21 0720  NA 138 138  K 4.0 4.2  CL 104 105  CO2 28 24  GLUCOSE 93 174*  BUN 13 15  CREATININE 0.92  0.94 0.81  CALCIUM 9.3 9.1   GFR: Estimated Creatinine Clearance: 89.1 mL/min (by C-G formula based on SCr of 0.81 mg/dL). Liver Function Tests: Recent Labs  Lab 05/16/21 1518 05/17/21 0720  AST 23 24  ALT 17 17  ALKPHOS 118 119  BILITOT 0.6 0.6  PROT 7.2 6.3*  ALBUMIN 3.9 3.3*   No results for input(s): LIPASE, AMYLASE in the last 168 hours. No results for input(s): AMMONIA in the last 168 hours. Coagulation Profile: No results for input(s): INR, PROTIME in the last 168 hours. Cardiac Enzymes: No results for input(s): CKTOTAL, CKMB, CKMBINDEX, TROPONINI in the last 168 hours. BNP (last 3 results) No results for input(s): PROBNP in the last 8760 hours. HbA1C: No results for input(s): HGBA1C in the last 72 hours. CBG: No results for input(s): GLUCAP in the last 168 hours. Lipid Profile: No results for input(s): CHOL, HDL, LDLCALC, TRIG, CHOLHDL, LDLDIRECT in the last 72 hours. Thyroid Function Tests: No results for input(s): TSH, T4TOTAL, FREET4, T3FREE, THYROIDAB in the last 72 hours. Anemia Panel: No results for input(s): VITAMINB12, FOLATE, FERRITIN, TIBC, IRON,  RETICCTPCT in the last 72 hours. Sepsis Labs: No results for input(s): PROCALCITON, LATICACIDVEN in the last 168 hours.  Recent Results (from the past 240 hour(s))  Resp Panel by RT-PCR (Flu A&B, Covid) Nasopharyngeal Swab     Status: None   Collection Time: 05/16/21  5:51 PM   Specimen: Nasopharyngeal Swab; Nasopharyngeal(NP) swabs in vial transport medium  Result Value Ref Range Status   SARS Coronavirus 2 by RT PCR NEGATIVE NEGATIVE Final    Comment: (NOTE) SARS-CoV-2 target nucleic acids are NOT DETECTED.  The SARS-CoV-2 RNA is generally detectable in upper respiratory specimens during the acute phase of infection. The lowest concentration of SARS-CoV-2 viral copies this assay can detect is 138 copies/mL. A negative result does not preclude SARS-Cov-2 infection and should not be used as the sole basis for treatment or other patient management decisions. A negative result may occur with  improper specimen collection/handling, submission of specimen other than nasopharyngeal swab, presence of viral mutation(s) within the areas targeted by this assay, and inadequate number of viral copies(<138 copies/mL). A  negative result must be combined with clinical observations, patient history, and epidemiological information. The expected result is Negative.  Fact Sheet for Patients:  EntrepreneurPulse.com.au  Fact Sheet for Healthcare Providers:  IncredibleEmployment.be  This test is no t yet approved or cleared by the [place] FDA and  has been authorized for detection and/or diagnosis of SARS-CoV-2 by FDA under an Emergency Use Authorization (EUA). This EUA will remain  in effect (meaning this test can be used) for the duration of the COVID-19 declaration under Section 564(b)(1) of the Act, 21 U.S.C.section 360bbb-3(b)(1), unless the authorization is terminated  or revoked sooner.       Influenza A by PCR NEGATIVE NEGATIVE Final   Influenza  B by PCR NEGATIVE NEGATIVE Final    Comment: (NOTE) The Xpert Xpress SARS-CoV-2/FLU/RSV plus assay is intended as an aid in the diagnosis of influenza from Nasopharyngeal swab specimens and should not be used as a sole basis for treatment. Nasal washings and aspirates are unacceptable for Xpert Xpress SARS-CoV-2/FLU/RSV testing.  Fact Sheet for Patients: EntrepreneurPulse.com.au  Fact Sheet for Healthcare Providers: IncredibleEmployment.be  This test is not yet approved or cleared by the [place] FDA and has been authorized for detection and/or diagnosis of SARS-CoV-2 by FDA under an Emergency Use Authorization (EUA). This EUA will remain in effect (meaning this test can be used) for the duration of the COVID-19 declaration under Section 564(b)(1) of the Act, 21 U.S.C. section 360bbb-3(b)(1), unless the authorization is terminated or revoked.  Performed at [facility], [address], [place], [place] [address]          Radiology Studies: No results found.      Scheduled Meds:   Continuous Infusions:  sodium chloride 75 mL/hr at 05/19/21 0907   methylPREDNISolone (SOLU-MEDROL) injection Stopped (05/18/21 1340)     LOS: 3 days    Time spent: 25 minutes    Sidney Ace, MD [facility] Pager [phone]  If 7PM-7AM, please contact night-coverage 05/19/2021, 12:06 PM

## 2021-05-19 NOTE — Consult Note (Signed)
[facility] Vascular Consult Note  MRN : [id number]  Glenn Perez is a 75 y.o. ([birth year]) male who presents with chief complaint of  Chief Complaint  Patient presents with   Blurred Vision   History of Present Illness:  Glenn Perez is a 75 y.o. male with medical history significant of history of syncope, paroxysmal atrial fibrillation, BPH who apparently was playing tennis last night when he felt blurry vision.    He went to bed and woke up this morning still blurry especially in the right eye.  He went to play tennis again this morning but his vision was getting worse.  He went to see him ophthalmologist who recommend that he comes to the ER for evaluation of temporal arteritis.  In the ER patient was seen and evaluated and found to have elevated CRP as well as sed rate.  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  Recommend admission with high-dose steroids.  Medical admission requested for.  Patient denied any headaches.  He still blurry in his vision.  No prior diagnosis of visual changes or temporal arteritis.  He is little bit fatigued.  Denied any tenderness over the TMJ symptoms.  He apparently had TMJ symptoms back in June when he had tooth extraction but then got prednisone.  Patient is being followed by urology for elevated PSA and known history of BPH.  At this point patient is being admitted to the hospital for evaluation and treatment..   ED Course: Temperature is 97.7 blood pressure 128/64, pulse 58, respirate of 18 and oxygen sats 98% on room air.  White count 7.6 hemoglobin 12.7 platelets 311.  The rest of the chemistry appeared to be within normal.  Sed rate is 46.  Respiratory panel currently pending.  Ophthalmology assessment has been done and patient is being admitted with suspicion for temporal arteritis.  Vascular surgery was consulted by Dr. Priscella Mann for possible temporal artery biopsy.  Current  Facility-Administered Medications  Medication Dose Route Frequency Provider Last Rate Last Admin   0.9 %  sodium chloride infusion   Intravenous Continuous Ralene Muskrat B, MD 75 mL/hr at 05/19/21 0907 New Bag at 05/19/21 0907   acetaminophen (TYLENOL) tablet 650 mg  650 mg Oral Q6H PRN Ralene Muskrat B, MD       methylPREDNISolone sodium succinate (SOLU-MEDROL) 1,000 mg in sodium chloride 0.9 % 50 mL IVPB  1,000 mg Intravenous Daily Ralene Muskrat B, MD   Stopped at 05/18/21 1340   ondansetron (ZOFRAN) tablet 4 mg  4 mg Oral Q6H PRN Elwyn Reach, MD       Or   ondansetron (ZOFRAN) injection 4 mg  4 mg Intravenous Q6H PRN Garba, Mohammad L, MD       oxyCODONE (Oxy IR/ROXICODONE) immediate release tablet 5 mg  5 mg Oral Q4H PRN Sidney Ace, MD       Past Medical History:  Diagnosis Date   Syncope    Past Surgical History:  Procedure Laterality Date   APPENDECTOMY     KNEE SURGERY     NOSE SURGERY     x 2.   REPLACEMENT TOTAL KNEE     SHOULDER SURGERY  10/2015   for partially torn rotator cuff and detached bicep tendon.   Social History Social History   Tobacco Use   Smoking status: Never   Smokeless tobacco: Never  Vaping Use   Vaping Use: Never used  Substance Use Topics  Alcohol use: Yes    Alcohol/week: 1.0 standard drink    Types: 1 Standard drinks or equivalent per week   Drug use: No   Family History Family History  Problem Relation Age of Onset   Diverticulosis Mother    Cancer Brother        unsure of what kind  Denies family history of peripheral artery disease, venous disease or autoimmune disease  No Known Allergies  REVIEW OF SYSTEMS (Negative unless checked)  Constitutional: [] Weight loss  [] Fever  [] Chills Cardiac: [] Chest pain   [] Chest pressure   [] Palpitations   [] Shortness of breath when laying flat   [] Shortness of breath at rest   [] Shortness of breath with exertion. Vascular:  [] Pain in legs with walking   [] Pain in legs  at rest   [] Pain in legs when laying flat   [] Claudication   [] Pain in feet when walking  [] Pain in feet at rest  [] Pain in feet when laying flat   [] History of DVT   [] Phlebitis   [] Swelling in legs   [] Varicose veins   [] Non-healing ulcers Pulmonary:   [] Uses home oxygen   [] Productive cough   [] Hemoptysis   [] Wheeze  [] COPD   [] Asthma Neurologic:  [] Dizziness  [] Blackouts   [] Seizures   [] History of stroke   [] History of TIA  [] Aphasia   [] Temporary blindness   [] Dysphagia   [] Weakness or numbness in arms   [] Weakness or numbness in legs Musculoskeletal:  [] Arthritis   [] Joint swelling   [] Joint pain   [] Low back pain Hematologic:  [] Easy bruising  [] Easy bleeding   [] Hypercoagulable state   [] Anemic  [] Hepatitis Gastrointestinal:  [] Blood in stool   [] Vomiting blood  [] Gastroesophageal reflux/heartburn   [] Difficulty swallowing. Genitourinary:  [] Chronic kidney disease   [] Difficult urination  [] Frequent urination  [] Burning with urination   [] Blood in urine Skin:  [] Rashes   [] Ulcers   [] Wounds Psychological:  [] History of anxiety   []  History of major depression.  Positive for visual disturbances  Physical Examination  Vitals:   05/18/21 1311 05/18/21 1810 05/18/21 2050 05/19/21 0401  BP: 128/65 129/60 (!) 143/66 (!) 143/73  Pulse: (!) 56 (!) 57 (!) 53 (!) 47  Resp: 20 20 17 17   Temp: (!) 97.4 F (36.3 C) 97.9 F (36.6 C)  (!) 97.5 F (36.4 C)  TempSrc: Oral Oral  Oral  SpO2: 98% 97% 99% 97%  Weight:      Height:       Body mass index is 23.19 kg/m. Gen:  WD/WN, NAD Head: [place]/AT, No temporalis wasting. Prominent temp pulse not noted. Ear/Nose/Throat: Hearing grossly intact, nares w/o erythema or drainage, oropharynx w/o Erythema/Exudate Eyes: Sclera non-icteric, conjunctiva clear Neck: Trachea midline.  No JVD.  Pulmonary:  Good air movement, respirations not labored, equal bilaterally.  Cardiac: RRR, normal S1, S2. Vascular:  Vessel Right Left  Radial Palpable Palpable   Ulnar Palpable Palpable  Brachial Palpable Palpable  Carotid Palpable, without bruit Palpable, without bruit  Aorta Not palpable N/A  Femoral Palpable Palpable  Popliteal Palpable Palpable  PT Palpable Palpable  DP Palpable Palpable   Gastrointestinal: soft, non-tender/non-distended. No guarding/reflex.  Musculoskeletal: M/S 5/5 throughout.  Extremities without ischemic changes.  No deformity or atrophy. No edema. Neurologic: Sensation grossly intact in extremities.  Symmetrical.  Speech is fluent. Motor exam as listed above. Psychiatric: Judgment intact, Mood & affect appropriate for pt's clinical situation. Dermatologic: No rashes or ulcers noted.  No cellulitis or open wounds. Lymph :  No Cervical, Axillary, or Inguinal lymphadenopathy.  CBC Lab Results  Component Value Date   WBC 7.5 05/17/2021   HGB 12.6 (L) 05/17/2021   HCT 38.6 (L) 05/17/2021   MCV 80.1 05/17/2021   PLT 282 05/17/2021   BMET    Component Value Date/Time   NA 138 05/17/2021 0720   NA 141 03/12/2021 1626   NA 137 01/03/2014 1344   K 4.2 05/17/2021 0720   K 3.9 01/03/2014 1344   CL 105 05/17/2021 0720   CL 104 01/03/2014 1344   CO2 24 05/17/2021 0720   CO2 30 01/03/2014 1344   GLUCOSE 174 (H) 05/17/2021 0720   GLUCOSE 95 01/03/2014 1344   BUN 15 05/17/2021 0720   BUN 13 03/12/2021 1626   BUN 15 01/03/2014 1344   CREATININE 0.81 05/17/2021 0720   CREATININE 0.90 01/03/2014 1344   CALCIUM 9.1 05/17/2021 0720   CALCIUM 8.7 01/03/2014 1344   GFRNONAA >60 05/17/2021 0720   GFRNONAA >60 01/03/2014 1344   GFRAA 103 04/19/2019 0000   GFRAA >60 01/03/2014 1344   Estimated Creatinine Clearance: 89.1 mL/min (by C-G formula based on SCr of 0.81 mg/dL).  COAG Lab Results  Component Value Date   INR 1.0 04/19/2019   INR 1.7 (H) 06/01/2009   INR 2.1 (H) 05/31/2009   Radiology US Carotid Bilateral  Result Date: 05/17/2021 CLINICAL DATA:  Vision loss, syncope EXAM: BILATERAL CAROTID DUPLEX  ULTRASOUND TECHNIQUE: Pearline Cables scale imaging, color Doppler and duplex ultrasound were performed of bilateral carotid and vertebral arteries in the neck. COMPARISON:  03/27/2014 FINDINGS: Criteria: Quantification of carotid stenosis is based on velocity parameters that correlate the residual internal carotid diameter with NASCET-based stenosis levels, using the diameter of the distal internal carotid lumen as the denominator for stenosis measurement. The following velocity measurements were obtained: RIGHT ICA: 134/23 cm/sec CCA: 885/02 cm/sec SYSTOLIC ICA/CCA RATIO:  1.3 ECA: 146 cm/sec LEFT ICA: 91/23 cm/sec CCA: 774/12 cm/sec SYSTOLIC ICA/CCA RATIO:  0.9 ECA: 144 cm/sec RIGHT CAROTID ARTERY: Eccentric noncalcified plaque in the bulb and proximal ICA normal waveforms and color Doppler signal throughout. Mild tortuosity. Resulting in mild stenosis. RIGHT VERTEBRAL ARTERY:  Normal flow direction and waveform. LEFT CAROTID ARTERY: Mild noncalcified plaque in the bulb, proximal ICA, and ECA origin with only mild stenosis. Normal waveforms and color Doppler signal throughout. LEFT VERTEBRAL ARTERY:  Normal flow direction and waveform. IMPRESSION: 1. 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 2. Antegrade bilateral vertebral arterial flow. Electronically Signed   By: Lucrezia Europe M.D.   On: 05/17/2021 09:38   MR PROSTATE W WO CONTRAST  Result Date: 05/15/2021 CLINICAL DATA:  Elevated PSA in a 75 year old male, increasing over time. EXAM: MR PROSTATE WITHOUT AND WITH CONTRAST TECHNIQUE: Multiplanar multisequence MRI images were obtained of the pelvis centered about the prostate. Pre and post contrast images were obtained. CONTRAST:  35m GADAVIST GADOBUTROL 1 MMOL/ML IV SOLN COMPARISON:  CT of the abdomen and pelvis of July of 2013 FINDINGS: Prostate: Transitional zone: Signs of BPH in the transitional zone. Area of T2 hypointensity blending with anterior fibromuscular stroma but  without the typical pattern of fibromuscular stroma displays potential increased/strict diffusion and is centered in the mid to apical anterior midline tracking towards the apex (image 17/5) 14 x 6 mm. T2 hypointensity with relative homogeneous appearance and indistinct margins. PIRADS category 4 Peripheral zone: Linear and wedge-shaped areas of T2 hypointensity throughout the peripheral zone without high-risk lesion. Volume: 29 cc  Transcapsular spread:  Absent Seminal vesicle involvement: Absent Neurovascular bundle involvement: Absent Pelvic adenopathy: Absent Bone metastasis: Absent Other findings: Cystic area in the LEFT hemiscrotum measures approximately 2.8 cm and is well-circumscribed and only partially visible. This is suggested on previous CT evaluations from 2013 as well. IMPRESSION: PIRADS category 4 lesion in the mid to apical prostate in the anterior gland as described. Prostate segmented and lesion marked in DynaCAD. No evidence of extracapsular extension, seminal vesicle invasion, or metastatic disease. Cystic area in the LEFT hemiscrotum measures approximately 2.8 cm and is only partially visible. This is suggested on previous CT evaluation from 2013 as well. This may represent a seminal vesicle cyst or spermatocele. Please correlate with physical exam and history with ultrasound correlation as warranted. Electronically Signed   By: Zetta Bills M.D.   On: 05/15/2021 16:43    Assessment/Plan The patient is a 75 year old male with past medical history of A. fib, BPH, status post left total knee replacement patient presents to the [facility]'s emergency department with progressively worsening blurry vision.  1.  Possible temporal arteritis: Patient presented to the [facility] emergency department after progressively worsening "blurred" vision".  Patient was seen by ophthalmology in the field given his elevated CRP / ESR and have a strong suspicion for  giant cell arteriolitis and are recommending temporal artery biopsy.  Patient has been started on steroids.  Procedure, risk and benefits of temporal artery biopsy were explained to the patient and his wife who is at the bedside.  All questions were answered.  We will plan on this either today or tomorrow depending on or availability  2.  Atrial fibrillation: Denies any chest pain. Currently, not on any anticoagulation  3.  BPH: Asymptomatic at this time On Flomax  Discussed with Dr. Mayme Genta, PA-C 05/19/2021 11:53 AM  This note was created with Dragon medical transcription system.  Any error is purely unintentional.

## 2021-05-20 ENCOUNTER — Inpatient Hospital Stay: Payer: Medicare HMO | Admitting: Anesthesiology

## 2021-05-20 ENCOUNTER — Encounter
Admission: EM | Disposition: A | Discharge status: Home / Self Care | Payer: Self-pay | Source: Home / Self Care | Attending: Internal Medicine

## 2021-05-20 DIAGNOSIS — H547 Unspecified visual loss: Secondary | ICD-10-CM

## 2021-05-20 DIAGNOSIS — M316 Other giant cell arteritis: Secondary | ICD-10-CM | POA: Diagnosis not present

## 2021-05-20 HISTORY — PX: ARTERY BIOPSY: SHX891

## 2021-05-20 LAB — CBC
HCT: 38.6 % — ABNORMAL LOW (ref 39.0–52.0)
Hemoglobin: 12.6 g/dL — ABNORMAL LOW (ref 13.0–17.0)
MCH: 26.4 pg (ref 26.0–34.0)
MCHC: 32.6 g/dL (ref 30.0–36.0)
MCV: 80.8 fL (ref 80.0–100.0)
Platelets: 281 10*3/uL (ref 150–400)
RBC: 4.78 MIL/uL (ref 4.22–5.81)
RDW: 13.8 % (ref 11.5–15.5)
WBC: 9.4 10*3/uL (ref 4.0–10.5)
nRBC: 0 % (ref 0.0–0.2)

## 2021-05-20 LAB — BASIC METABOLIC PANEL
Anion gap: 9 (ref 5–15)
BUN: 27 mg/dL — ABNORMAL HIGH (ref 8–23)
CO2: 25 mmol/L (ref 22–32)
Calcium: 8.6 mg/dL — ABNORMAL LOW (ref 8.9–10.3)
Chloride: 104 mmol/L (ref 98–111)
Creatinine, Ser: 0.89 mg/dL (ref 0.61–1.24)
GFR, Estimated: >60 mL/min (ref >60)
Glucose, Bld: 130 mg/dL — ABNORMAL HIGH (ref 70–99)
Potassium: 3.8 mmol/L (ref 3.5–5.1)
Sodium: 138 mmol/L (ref 135–145)

## 2021-05-20 LAB — TYPE AND SCREEN
ABO/RH(D): A POS
Antibody Screen: NEGATIVE

## 2021-05-20 LAB — C-REACTIVE PROTEIN: CRP: 0.6 mg/dL (ref <1.0)

## 2021-05-20 LAB — SURGICAL PCR SCREEN
MRSA, PCR: NEGATIVE
Staphylococcus aureus: NEGATIVE

## 2021-05-20 LAB — SEDIMENTATION RATE: Sed Rate: 17 mm/hr (ref 0–20)

## 2021-05-20 SURGERY — BIOPSY TEMPORAL ARTERY
Anesthesia: General | Laterality: Left

## 2021-05-20 MED ORDER — PROPOFOL 10 MG/ML IV BOLUS
INTRAVENOUS | Status: AC
  Filled 2021-05-20: qty 20

## 2021-05-20 MED ORDER — ACETAMINOPHEN 325 MG PO TABS: 650.0000 mg | ORAL_TABLET | Freq: Four times a day (QID) | ORAL | Status: DC | PRN

## 2021-05-20 MED ORDER — ONDANSETRON HCL 4 MG/2ML IJ SOLN
INTRAMUSCULAR | Status: DC | PRN
  Administered 2021-05-20: 4 mg via INTRAVENOUS

## 2021-05-20 MED ORDER — MIDAZOLAM HCL 2 MG/2ML IJ SOLN
INTRAMUSCULAR | Status: DC | PRN
  Administered 2021-05-20 (×2): 1 mg via INTRAVENOUS

## 2021-05-20 MED ORDER — OXYCODONE HCL 5 MG PO TABS: 5.0000 mg | ORAL_TABLET | ORAL | 0 refills | Status: DC | PRN

## 2021-05-20 MED ORDER — PROPOFOL 10 MG/ML IV BOLUS
INTRAVENOUS | Status: AC
  Filled 2021-05-20: qty 40

## 2021-05-20 MED ORDER — FENTANYL CITRATE (PF) 100 MCG/2ML IJ SOLN
INTRAMUSCULAR | Status: DC | PRN
  Administered 2021-05-20 (×4): 25 ug via INTRAVENOUS

## 2021-05-20 MED ORDER — PROPOFOL 10 MG/ML IV BOLUS
INTRAVENOUS | Status: DC | PRN
  Administered 2021-05-20: 40 mg via INTRAVENOUS

## 2021-05-20 MED ORDER — ROCURONIUM BROMIDE 10 MG/ML (PF) SYRINGE
PREFILLED_SYRINGE | INTRAVENOUS | Status: AC
  Filled 2021-05-20: qty 10

## 2021-05-20 MED ORDER — FENTANYL CITRATE (PF) 100 MCG/2ML IJ SOLN
INTRAMUSCULAR | Status: AC
  Filled 2021-05-20: qty 2

## 2021-05-20 MED ORDER — MIDAZOLAM HCL 2 MG/2ML IJ SOLN
INTRAMUSCULAR | Status: AC
  Filled 2021-05-20: qty 2

## 2021-05-20 MED ORDER — 0.9 % SODIUM CHLORIDE (POUR BTL) OPTIME
TOPICAL | Status: DC | PRN
  Administered 2021-05-20: 100 mL

## 2021-05-20 MED ORDER — ONDANSETRON HCL 4 MG/2ML IJ SOLN
INTRAMUSCULAR | Status: AC
  Filled 2021-05-20: qty 2

## 2021-05-20 MED ORDER — EPHEDRINE SULFATE 50 MG/ML IJ SOLN
INTRAMUSCULAR | Status: DC | PRN
  Administered 2021-05-20: 5 mg via INTRAVENOUS

## 2021-05-20 MED ORDER — PROPOFOL 500 MG/50ML IV EMUL
INTRAVENOUS | Status: AC
  Filled 2021-05-20: qty 50

## 2021-05-20 MED ORDER — SUCCINYLCHOLINE CHLORIDE 200 MG/10ML IV SOSY
PREFILLED_SYRINGE | INTRAVENOUS | Status: AC
  Filled 2021-05-20: qty 10

## 2021-05-20 MED ORDER — PROPOFOL 500 MG/50ML IV EMUL
INTRAVENOUS | Status: DC | PRN
  Administered 2021-05-20: 75 ug/kg/min via INTRAVENOUS

## 2021-05-20 MED ORDER — PREDNISONE 20 MG PO TABS: 80.0000 mg | ORAL_TABLET | Freq: Every day | ORAL | 0 refills | Status: AC

## 2021-05-20 MED ORDER — CEFAZOLIN SODIUM-DEXTROSE 2-4 GM/100ML-% IV SOLN
INTRAVENOUS | Status: AC
  Filled 2021-05-20: qty 100

## 2021-05-20 MED ORDER — LIDOCAINE-EPINEPHRINE 1 %-1:100000 IJ SOLN
INTRAMUSCULAR | Status: DC | PRN
  Administered 2021-05-20: 7 mL

## 2021-05-20 MED ORDER — ONDANSETRON HCL 4 MG PO TABS: 4.0000 mg | ORAL_TABLET | Freq: Four times a day (QID) | ORAL | 0 refills | Status: DC | PRN

## 2021-05-20 MED ORDER — BUPIVACAINE HCL (PF) 0.5 % IJ SOLN
INTRAMUSCULAR | Status: DC | PRN
  Administered 2021-05-20: 8 mL

## 2021-05-20 SURGICAL SUPPLY — 37 items
ADH SKN CLS APL DERMABOND .7 (GAUZE/BANDAGES/DRESSINGS) ×1
BLADE CLIPPER SURG (BLADE) ×2 IMPLANT
BLADE SURG 15 STRL LF DISP TIS (BLADE) ×1 IMPLANT
BLADE SURG 15 STRL SS (BLADE) ×2
COTTON BALL STRL MEDIUM (GAUZE/BANDAGES/DRESSINGS) ×2 IMPLANT
DERMABOND ADVANCED (GAUZE/BANDAGES/DRESSINGS) ×1
DERMABOND ADVANCED .7 DNX12 (GAUZE/BANDAGES/DRESSINGS) ×1 IMPLANT
DRAPE LAPAROTOMY 77X122 PED (DRAPES) ×2 IMPLANT
DRSG TELFA 4X3 1S NADH ST (GAUZE/BANDAGES/DRESSINGS) ×2 IMPLANT
ELECT CAUTERY BLADE 6.4 (BLADE) ×2 IMPLANT
ELECT REM PT RETURN 9FT ADLT (ELECTROSURGICAL) ×2
ELECTRODE REM PT RTRN 9FT ADLT (ELECTROSURGICAL) ×1 IMPLANT
GAUZE 4X4 16PLY ~~LOC~~+RFID DBL (SPONGE) ×2 IMPLANT
GLOVE SURG ENC MOIS LTX SZ7 (GLOVE) ×2 IMPLANT
GLOVE SURG SYN 8.0 (GLOVE) ×2 IMPLANT
GLOVE SURG SYN 8.0 PF PI (GLOVE) ×1 IMPLANT
GLOVE SURG UNDER LTX SZ7.5 (GLOVE) ×2 IMPLANT
GOWN STRL REUS W/ TWL XL LVL3 (GOWN DISPOSABLE) ×2 IMPLANT
GOWN STRL REUS W/TWL XL LVL3 (GOWN DISPOSABLE) ×2
LABEL OR SOLS (LABEL) ×2 IMPLANT
MANIFOLD NEPTUNE II (INSTRUMENTS) ×2 IMPLANT
NDL HYPO 25X1 1.5 SAFETY (NEEDLE) ×2 IMPLANT
NEEDLE HYPO 25X1 1.5 SAFETY (NEEDLE) ×4 IMPLANT
NS IRRIG 500ML POUR BTL (IV SOLUTION) ×2 IMPLANT
PACK BASIN MINOR ARMC (MISCELLANEOUS) ×2 IMPLANT
SOL PREP PVP 2OZ (MISCELLANEOUS) ×2
SOLUTION PREP PVP 2OZ (MISCELLANEOUS) ×1 IMPLANT
SUCTION FRAZIER HANDLE 10FR (MISCELLANEOUS) ×2
SUCTION TUBE FRAZIER 10FR DISP (MISCELLANEOUS) ×1 IMPLANT
SUT MNCRL AB 4-0 PS2 18 (SUTURE) ×2 IMPLANT
SUT SILK 3 0 (SUTURE) ×2
SUT SILK 3-0 18XBRD TIE 12 (SUTURE) ×1 IMPLANT
SUT VIC AB 3-0 SH 27 (SUTURE) ×2
SUT VIC AB 3-0 SH 27X BRD (SUTURE) ×1 IMPLANT
SYR 10ML LL (SYRINGE) ×4 IMPLANT
SYR BULB IRRIG 60ML STRL (SYRINGE) ×2 IMPLANT
WATER STERILE IRR 500ML POUR (IV SOLUTION) ×2 IMPLANT

## 2021-05-20 NOTE — Interval H&P Note (Signed)
History and Physical Interval Note:  05/20/2021 12:38 PM  Glenn Perez  has presented today for surgery, with the diagnosis of Temporal Arteritis.  The various methods of treatment have been discussed with the patient and family. After consideration of risks, benefits and other options for treatment, the patient has consented to  Procedure(s): BIOPSY TEMPORAL ARTERY (Left) as a surgical intervention.  The patient's history has been reviewed, patient examined, no change in status, stable for surgery.  I have reviewed the patient's chart and labs.  Questions were answered to the patient's satisfaction.     [name]

## 2021-05-20 NOTE — Progress Notes (Signed)
Patient discharged to home via wheelchair, accompanied by daughter.  Patient discharged with pertinent information, prescriptions, and personal belongings.  Patient able to teach back instructions, all questions answered.  IV d/ced.  No acute distress noted.  Care relinquished.

## 2021-05-20 NOTE — Op Note (Signed)
        OPERATIVE NOTE   PRE-OPERATIVE DIAGNOSIS: suspected temporal arteritis, visual loss  POST-OPERATIVE DIAGNOSIS: Same as above  PROCEDURE: 1.   Left temporal artery biopsy  SURGEON: [name], MD  ASSISTANT(S): None  ANESTHESIA: MAC  ESTIMATED BLOOD LOSS: Minimal  FINDING(S): 1.  none  SPECIMEN(S):  Left superficial temporal artery sent to pathology  INDICATIONS:   Patient is a 75 y.o. male who presents with visual loss left eye elevated inflammatory markers.  We were consulted by medicine for consideration for temporal artery biopsy. Risks and benefits were discussed and he was agreeable to proceed.  DESCRIPTION: After obtaining full informed written consent, the patient was brought back to the operating room and placed supine upon the operating table.  The patient received IV antibiotics prior to induction.  After obtaining adequate anesthesia, the patient was prepped and draped in the standard fashion. The area in front of his left ear was anesthetized copiously with a solution of 1% lidocaine and half percent Marcaine without epinephrine. I then made an incision just in front of the left ear overlying the palpable pulse. I then dissected down through the subcutaneous tissues and identified the superficial temporal artery. This was dissected out over a several centimeters and branches were ligated and divided between silk ties. Care was used to avoid electrocautery around the artery. I then clamped the artery proximally and distally and transected the artery. The specimen was then sent to pathology. The proximal and distal artery were ligated with 3-0 silk ties. Hemostasis was achieved. The wound was then closed with a series of interrupted 3-0 Vicryl's and the skin was closed with a 4-0 Monocryl. Sterile dressing was placed. The patient was taken to the recovery room in stable condition having tolerated the procedure well.  COMPLICATIONS: None  CONDITION:  Stable   [name] 05/20/2021 1:55 PM  This note was created with Dragon Medical transcription system. Any errors in dictation are purely unintentional.

## 2021-05-20 NOTE — Transfer of Care (Signed)
Immediate Anesthesia Transfer of Care Note  Patient: Glenn Perez  Procedure(s) Performed: BIOPSY TEMPORAL ARTERY (Left)  Patient Location: PACU  Anesthesia Type:MAC and General  Level of Consciousness: awake  Airway & Oxygen Therapy: Patient Spontanous Breathing and Patient connected to nasal cannula oxygen  Post-op Assessment: Report given to RN and Post -op Vital signs reviewed and stable  Post vital signs: Reviewed and stable  Last Vitals:  Vitals Value Taken Time  BP 130/61 05/20/21 1400  Temp    Pulse 68 05/20/21 1401  Resp 19 05/20/21 1401  SpO2 94 % 05/20/21 1401  Vitals shown include unvalidated device data.  Last Pain:  Vitals:   05/20/21 1212  TempSrc: Temporal  PainSc: 0-No pain         Complications: No notable events documented.

## 2021-05-20 NOTE — Discharge Summary (Signed)
Physician Discharge Summary  Glenn Perez WYO:[id number] DOB: [birth year] DOA: 05/16/2021  PCP: Jerrol Banana., MD  Admit date: 05/16/2021 Discharge date: 05/20/2021  Admitted From: Home Disposition: Home  Recommendations for Outpatient Follow-up:  Follow up with PCP in 1-2 weeks Follow-up with vascular surgery 1 week Follow-up with ophthalmology 3 to 7 days Ambulatory referral to rheumatology  Home Health: No Equipment/Devices: None  Discharge Condition: Stable CODE STATUS: Full Diet recommendation: Regular  Brief/Interim Summary: 75 y.o. male with medical history significant of history of syncope, paroxysmal atrial fibrillation, BPH who apparently was playing tennis last night when he felt blurry vision.  He went to bed and woke up this morning still blurry especially in the right eye.  He went to play tennis again this morning but his vision was getting worse.  He went to see him ophthalmologist who recommend that he comes to the ER for evaluation of temporal arteritis.  In the ER patient was seen and evaluated and found to have elevated CRP as well as sed rate.  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  Recommend admission with high-dose steroids   Patient received 1000 mg IV Solu-Medrol on the day of admission.  I evaluated the patient the following morning and stated that his vision was slightly improved from prior.   Of note the patient is also being followed by urology for elevated PSA known history of BPH.  He had a recent MRI prostate for which he is awaiting follow-up with urology.   The patient will be admitted for empiric treatment of giant cell arteritis and ophthalmology and vascular surgery consultation.   Patient did receive high-dose steroids since admission.  1 g Solu-Medrol daily.  Vascular surgery performed temporal artery biopsy on 8/30.  Patient stable for discharge after biopsy.  Convert steroids to prednisone 80 mg a  day.  2-week course provided.  Patient needs to follow-up with ophthalmology postdischarge.  He will see vascular surgery in 1 week for wound check.  Ambulatory referral placed for rheumatology at time of discharge.   Discharge Diagnoses:  Principal Problem:   Temporal arteritis ([place]) Active Problems:   Paroxysmal A-fib ([facility])   Pain in right knee   Benign prostatic hyperplasia without lower urinary tract symptoms  Unilateral vision loss Concern for temporal arteritis Patient presented to optometry and was referred to ophthalmology as outpatient Findings concerning for gentle arteritis considering elevated inflammatory markers ESR typically greater than 100 however currently ESR 46 does not exclude this diagnosis -Carotid Doppler less than 50% hemodynamic stenosis Temporal artery biopsy performed 8/30 Plan: DC IV Solu-Medrol.  Start p.o. prednisone 80 mg daily.  First dose 8/31.  Discharge home with outpatient follow-up with ophthalmology.  Ambulatory referral to rheumatology.  Will present to vascular surgery in 1 week for wound check.   History of BPH History of elevated PSA Patient is undergoing outpatient work-up with urology.  Recently had a prostate MRI.   Outpatient follow-up with urology being arranged   History of PAF No medications, not anticoagulated Defer to outpatient providers  Discharge Instructions  Discharge Instructions     Ambulatory referral to Rheumatology   Complete by: As directed    Suspected GCA.  Temporal artery biopsy performed at [facility] on 8/30      Allergies as of 05/20/2021   No Known Allergies      Medication List     TAKE these medications    acetaminophen 325 MG tablet Commonly known as: TYLENOL  Take 2 tablets (650 mg total) by mouth every 6 (six) hours as needed for mild pain, fever or headache.   ondansetron 4 MG tablet Commonly known as: ZOFRAN Take 1 tablet (4 mg total) by mouth every 6 (six) hours as needed for nausea.    oxyCODONE 5 MG immediate release tablet Commonly known as: Oxy IR/ROXICODONE Take 1 tablet (5 mg total) by mouth every 4 (four) hours as needed for severe pain.   predniSONE 20 MG tablet Commonly known as: DELTASONE Take 4 tablets (80 mg total) by mouth daily for 14 days. Start taking on: May 21, 2021   tamsulosin 0.4 MG Caps capsule Commonly known as: FLOMAX Take 1 capsule (0.4 mg total) by mouth daily.        Follow-up Information     Emmaline Kluver., MD. Schedule an appointment as soon as possible for a visit in 1 week(s).   Specialty: Rheumatology Contact information: [place] [phone]                No Known Allergies  Consultations: Vascular surgery Ophthalmology   Procedures/Studies: US Carotid Bilateral  Result Date: 05/17/2021 CLINICAL DATA:  Vision loss, syncope EXAM: BILATERAL CAROTID DUPLEX ULTRASOUND TECHNIQUE: Pearline Cables scale imaging, color Doppler and duplex ultrasound were performed of bilateral carotid and vertebral arteries in the neck. COMPARISON:  03/27/2014 FINDINGS: Criteria: Quantification of carotid stenosis is based on velocity parameters that correlate the residual internal carotid diameter with NASCET-based stenosis levels, using the diameter of the distal internal carotid lumen as the denominator for stenosis measurement. The following velocity measurements were obtained: RIGHT ICA: 134/23 cm/sec CCA: 264/15 cm/sec SYSTOLIC ICA/CCA RATIO:  1.3 ECA: 146 cm/sec LEFT ICA: 91/23 cm/sec CCA: 830/94 cm/sec SYSTOLIC ICA/CCA RATIO:  0.9 ECA: 144 cm/sec RIGHT CAROTID ARTERY: Eccentric noncalcified plaque in the bulb and proximal ICA normal waveforms and color Doppler signal throughout. Mild tortuosity. Resulting in mild stenosis. RIGHT VERTEBRAL ARTERY:  Normal flow direction and waveform. LEFT CAROTID ARTERY: Mild noncalcified plaque in the bulb, proximal ICA, and ECA origin with  only mild stenosis. Normal waveforms and color Doppler signal throughout. LEFT VERTEBRAL ARTERY:  Normal flow direction and waveform. IMPRESSION: 1. 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 2. Antegrade bilateral vertebral arterial flow. Electronically Signed   By: Lucrezia Europe M.D.   On: 05/17/2021 09:38   MR PROSTATE W WO CONTRAST  Result Date: 05/15/2021 CLINICAL DATA:  Elevated PSA in a 75 year old male, increasing over time. EXAM: MR PROSTATE WITHOUT AND WITH CONTRAST TECHNIQUE: Multiplanar multisequence MRI images were obtained of the pelvis centered about the prostate. Pre and post contrast images were obtained. CONTRAST:  66m GADAVIST GADOBUTROL 1 MMOL/ML IV SOLN COMPARISON:  CT of the abdomen and pelvis of July of 2013 FINDINGS: Prostate: Transitional zone: Signs of BPH in the transitional zone. Area of T2 hypointensity blending with anterior fibromuscular stroma but without the typical pattern of fibromuscular stroma displays potential increased/strict diffusion and is centered in the mid to apical anterior midline tracking towards the apex (image 17/5) 14 x 6 mm. T2 hypointensity with relative homogeneous appearance and indistinct margins. PIRADS category 4 Peripheral zone: Linear and wedge-shaped areas of T2 hypointensity throughout the peripheral zone without high-risk lesion. Volume: 29 cc Transcapsular spread:  Absent Seminal vesicle involvement: Absent Neurovascular bundle involvement: Absent Pelvic adenopathy: Absent Bone metastasis: Absent Other findings: Cystic area in the LEFT hemiscrotum measures approximately 2.8  cm and is well-circumscribed and only partially visible. This is suggested on previous CT evaluations from 2013 as well. IMPRESSION: PIRADS category 4 lesion in the mid to apical prostate in the anterior gland as described. Prostate segmented and lesion marked in DynaCAD. No evidence of extracapsular extension, seminal vesicle invasion,  or metastatic disease. Cystic area in the LEFT hemiscrotum measures approximately 2.8 cm and is only partially visible. This is suggested on previous CT evaluation from 2013 as well. This may represent a seminal vesicle cyst or spermatocele. Please correlate with physical exam and history with ultrasound correlation as warranted. Electronically Signed   By: Zetta Bills M.D.   On: 05/15/2021 16:43   (Echo, Carotid, EGD, Colonoscopy, ERCP)    Subjective: Seen and examined on the day of discharge.  Stable no distress.  Discharge Exam: Vitals:   05/20/21 0524 05/20/21 0804  BP: 137/70 (!) 153/68  Pulse: (!) 50 (!) 54  Resp: 19 18  Temp: 97.8 F (36.6 C) 97.6 F (36.4 C)  SpO2: 97% 97%   Vitals:   05/19/21 0401 05/19/21 2022 05/20/21 0524 05/20/21 0804  BP: (!) 143/73 (!) 141/70 137/70 (!) 153/68  Pulse: (!) 47 62 (!) 50 (!) 54  Resp: 17 18 19 18   Temp: (!) 97.5 F (36.4 C) 97.6 F (36.4 C) 97.8 F (36.6 C) 97.6 F (36.4 C)  TempSrc: Oral Oral Oral Oral  SpO2: 97% 98% 97% 97%  Weight:      Height:        General: Pt is alert, awake, not in acute distress Cardiovascular: RRR, S1/S2 +, no rubs, no gallops Respiratory: CTA bilaterally, no wheezing, no rhonchi Abdominal: Soft, NT, ND, bowel sounds + Extremities: no edema, no cyanosis    The results of significant diagnostics from this hospitalization (including imaging, microbiology, ancillary and laboratory) are listed below for reference.     Microbiology: Recent Results (from the past 240 hour(s))  Resp Panel by RT-PCR (Flu A&B, Covid) Nasopharyngeal Swab     Status: None   Collection Time: 05/16/21  5:51 PM   Specimen: Nasopharyngeal Swab; Nasopharyngeal(NP) swabs in vial transport medium  Result Value Ref Range Status   SARS Coronavirus 2 by RT PCR NEGATIVE NEGATIVE Final    Comment: (NOTE) SARS-CoV-2 target nucleic acids are NOT DETECTED.  The SARS-CoV-2 RNA is generally detectable in upper  respiratory specimens during the acute phase of infection. The lowest concentration of SARS-CoV-2 viral copies this assay can detect is 138 copies/mL. A negative result does not preclude SARS-Cov-2 infection and should not be used as the sole basis for treatment or other patient management decisions. A negative result may occur with  improper specimen collection/handling, submission of specimen other than nasopharyngeal swab, presence of viral mutation(s) within the areas targeted by this assay, and inadequate number of viral copies(<138 copies/mL). A negative result must be combined with clinical observations, patient history, and epidemiological information. The expected result is Negative.  Fact Sheet for Patients:  EntrepreneurPulse.com.au  Fact Sheet for Healthcare Providers:  IncredibleEmployment.be  This test is no t yet approved or cleared by the [place] FDA and  has been authorized for detection and/or diagnosis of SARS-CoV-2 by FDA under an Emergency Use Authorization (EUA). This EUA will remain  in effect (meaning this test can be used) for the duration of the COVID-19 declaration under Section 564(b)(1) of the Act, 21 U.S.C.section 360bbb-3(b)(1), unless the authorization is terminated  or revoked sooner.       Influenza A  by PCR NEGATIVE NEGATIVE Final   Influenza B by PCR NEGATIVE NEGATIVE Final    Comment: (NOTE) The Xpert Xpress SARS-CoV-2/FLU/RSV plus assay is intended as an aid in the diagnosis of influenza from Nasopharyngeal swab specimens and should not be used as a sole basis for treatment. Nasal washings and aspirates are unacceptable for Xpert Xpress SARS-CoV-2/FLU/RSV testing.  Fact Sheet for Patients: EntrepreneurPulse.com.au  Fact Sheet for Healthcare Providers: IncredibleEmployment.be  This test is not yet approved or cleared by the [place] FDA and has been  authorized for detection and/or diagnosis of SARS-CoV-2 by FDA under an Emergency Use Authorization (EUA). This EUA will remain in effect (meaning this test can be used) for the duration of the COVID-19 declaration under Section 564(b)(1) of the Act, 21 U.S.C. section 360bbb-3(b)(1), unless the authorization is terminated or revoked.  Performed at [facility], [address] [place], [place] [address]   Surgical pcr screen     Status: None   Collection Time: 05/20/21  1:31 AM   Specimen: Nasal Mucosa; Nasal Swab  Result Value Ref Range Status   MRSA, PCR NEGATIVE NEGATIVE Final   Staphylococcus aureus NEGATIVE NEGATIVE Final    Comment: (NOTE) The Xpert SA Assay (FDA approved for NASAL specimens in patients 39 years of age and older), is one component of a comprehensive surveillance program. It is not intended to diagnose infection nor to guide or monitor treatment. Performed at [facility], [address] [place], [place] [address]      Labs: BNP (last 3 results) No results for input(s): BNP in the last 8760 hours. Basic Metabolic Panel: Recent Labs  Lab 05/16/21 1518 05/17/21 0720 05/20/21 0542  NA 138 138 138  K 4.0 4.2 3.8  CL 104 105 104  CO2 28 24 25   GLUCOSE 93 174* 130*  BUN 13 15 27*  CREATININE 0.92  0.94 0.81 0.89  CALCIUM 9.3 9.1 8.6*   Liver Function Tests: Recent Labs  Lab 05/16/21 1518 05/17/21 0720  AST 23 24  ALT 17 17  ALKPHOS 118 119  BILITOT 0.6 0.6  PROT 7.2 6.3*  ALBUMIN 3.9 3.3*   No results for input(s): LIPASE, AMYLASE in the last 168 hours. No results for input(s): AMMONIA in the last 168 hours. CBC: Recent Labs  Lab 05/16/21 1518 05/17/21 0720 05/20/21 0542  WBC 7.6 7.5 9.4  NEUTROABS 5.2  --   --   HGB 12.7* 12.6* 12.6*  HCT 39.6 38.6* 38.6*  MCV 81.1 80.1 80.8  PLT 311 282 281   Cardiac Enzymes: No results for input(s): CKTOTAL, CKMB, CKMBINDEX, TROPONINI in the last 168 hours. BNP: Invalid  input(s): POCBNP CBG: No results for input(s): GLUCAP in the last 168 hours. D-Dimer No results for input(s): DDIMER in the last 72 hours. Hgb A1c No results for input(s): HGBA1C in the last 72 hours. Lipid Profile No results for input(s): CHOL, HDL, LDLCALC, TRIG, CHOLHDL, LDLDIRECT in the last 72 hours. Thyroid function studies No results for input(s): TSH, T4TOTAL, T3FREE, THYROIDAB in the last 72 hours.  Invalid input(s): FREET3 Anemia work up No results for input(s): VITAMINB12, FOLATE, FERRITIN, TIBC, IRON, RETICCTPCT in the last 72 hours. Urinalysis    Component Value Date/Time   COLORURINE Yellow 03/24/2012 0420   COLORURINE YELLOW 05/21/2009 0839   APPEARANCEUR Cloudy 03/24/2012 0420   LABSPEC 1.020 03/24/2012 0420   PHURINE 7.0 03/24/2012 0420   PHURINE 6.0 05/21/2009 0839   GLUCOSEU Negative 03/24/2012 0420   HGBUR  3+ 03/24/2012 0420   HGBUR NEGATIVE 05/21/2009 0839   BILIRUBINUR Negative 03/24/2012 0420   KETONESUR Negative 03/24/2012 0420   KETONESUR NEGATIVE 05/21/2009 0839   PROTEINUR 30 mg/dL 03/24/2012 0420   PROTEINUR NEGATIVE 05/21/2009 0839   UROBILINOGEN 0.2 05/21/2009 0839   NITRITE Negative 03/24/2012 0420   NITRITE NEGATIVE 05/21/2009 0839   LEUKOCYTESUR Negative 03/24/2012 0420   Sepsis Labs Invalid input(s): PROCALCITONIN,  WBC,  LACTICIDVEN Microbiology Recent Results (from the past 240 hour(s))  Resp Panel by RT-PCR (Flu A&B, Covid) Nasopharyngeal Swab     Status: None   Collection Time: 05/16/21  5:51 PM   Specimen: Nasopharyngeal Swab; Nasopharyngeal(NP) swabs in vial transport medium  Result Value Ref Range Status   SARS Coronavirus 2 by RT PCR NEGATIVE NEGATIVE Final    Comment: (NOTE) SARS-CoV-2 target nucleic acids are NOT DETECTED.  The SARS-CoV-2 RNA is generally detectable in upper respiratory specimens during the acute phase of infection. The lowest concentration of SARS-CoV-2 viral copies this assay can detect is 138  copies/mL. A negative result does not preclude SARS-Cov-2 infection and should not be used as the sole basis for treatment or other patient management decisions. A negative result may occur with  improper specimen collection/handling, submission of specimen other than nasopharyngeal swab, presence of viral mutation(s) within the areas targeted by this assay, and inadequate number of viral copies(<138 copies/mL). A negative result must be combined with clinical observations, patient history, and epidemiological information. The expected result is Negative.  Fact Sheet for Patients:  EntrepreneurPulse.com.au  Fact Sheet for Healthcare Providers:  IncredibleEmployment.be  This test is no t yet approved or cleared by the [place] FDA and  has been authorized for detection and/or diagnosis of SARS-CoV-2 by FDA under an Emergency Use Authorization (EUA). This EUA will remain  in effect (meaning this test can be used) for the duration of the COVID-19 declaration under Section 564(b)(1) of the Act, 21 U.S.C.section 360bbb-3(b)(1), unless the authorization is terminated  or revoked sooner.       Influenza A by PCR NEGATIVE NEGATIVE Final   Influenza B by PCR NEGATIVE NEGATIVE Final    Comment: (NOTE) The Xpert Xpress SARS-CoV-2/FLU/RSV plus assay is intended as an aid in the diagnosis of influenza from Nasopharyngeal swab specimens and should not be used as a sole basis for treatment. Nasal washings and aspirates are unacceptable for Xpert Xpress SARS-CoV-2/FLU/RSV testing.  Fact Sheet for Patients: EntrepreneurPulse.com.au  Fact Sheet for Healthcare Providers: IncredibleEmployment.be  This test is not yet approved or cleared by the [place] FDA and has been authorized for detection and/or diagnosis of SARS-CoV-2 by FDA under an Emergency Use Authorization (EUA). This EUA will remain in effect (meaning  this test can be used) for the duration of the COVID-19 declaration under Section 564(b)(1) of the Act, 21 U.S.C. section 360bbb-3(b)(1), unless the authorization is terminated or revoked.  Performed at [facility], [address], [place], [place] [address]   Surgical pcr screen     Status: None   Collection Time: 05/20/21  1:31 AM   Specimen: Nasal Mucosa; Nasal Swab  Result Value Ref Range Status   MRSA, PCR NEGATIVE NEGATIVE Final   Staphylococcus aureus NEGATIVE NEGATIVE Final    Comment: (NOTE) The Xpert SA Assay (FDA approved for NASAL specimens in patients 78 years of age and older), is one component of a comprehensive surveillance program. It is not intended to diagnose infection nor to guide or monitor treatment. Performed at [facility]  [facility], [address], [place], [place] [address]      Time coordinating discharge: Over 30 minutes  SIGNED:   Sidney Ace, MD  [facility] 05/20/2021, 11:54 AM Pager   If 7PM-7AM, please contact night-coverage

## 2021-05-20 NOTE — Anesthesia Preprocedure Evaluation (Addendum)
Anesthesia Evaluation  Patient identified by MRN, date of birth, ID band Patient awake    Reviewed: Allergy & Precautions, NPO status , Patient's Chart, lab work & pertinent test results  Airway Mallampati: II  TM Distance: >3 FB Neck ROM: full    Dental  (+) Missing,    Pulmonary neg pulmonary ROS,    Pulmonary exam normal        Cardiovascular Exercise Tolerance: Good Normal cardiovascular exam+ dysrhythmias (paroxysmal afib)      Neuro/Psych Syncope, blurry vision on high dose steroid treatment for suspected temporal arteritis.  negative psych ROS   GI/Hepatic negative GI ROS, Neg liver ROS,   Endo/Other  negative endocrine ROS  Renal/GU negative Renal ROS  negative genitourinary   Musculoskeletal   Abdominal Normal abdominal exam  (+)   Peds  Hematology negative hematology ROS (+)   Anesthesia Other Findings Past Medical History: No date: Syncope, paroxysmal atrial fibrillation, BPH  Past Surgical History: No date: APPENDECTOMY No date: KNEE SURGERY No date: NOSE SURGERY     Comment:  x 2. No date: REPLACEMENT TOTAL KNEE 10/2015: SHOULDER SURGERY     Comment:  for partially torn rotator cuff and detached bicep               tendon.  BMI    Body Mass Index: 23.19 kg/m      Reproductive/Obstetrics negative OB ROS                            Anesthesia Physical Anesthesia Plan  ASA: 2  Anesthesia Plan: General   Post-op Pain Management:    Induction: Intravenous  PONV Risk Score and Plan: Propofol infusion and TIVA  Airway Management Planned: Nasal Cannula and Natural Airway  Additional Equipment:   Intra-op Plan:   Post-operative Plan:   Informed Consent: I have reviewed the patients History and Physical, chart, labs and discussed the procedure including the risks, benefits and alternatives for the proposed anesthesia with the patient or authorized representative  who has indicated his/her understanding and acceptance.     Dental Advisory Given  Plan Discussed with: Anesthesiologist and CRNA  Anesthesia Plan Comments:         Anesthesia Quick Evaluation

## 2021-05-20 NOTE — Telephone Encounter (Signed)
Pt is still hospitalized.

## 2021-05-20 NOTE — Interval H&P Note (Signed)
History and Physical Interval Note:  05/20/2021 12:40 PM  Glenn Perez  has presented today for surgery, with the diagnosis of Temporal Arteritis.  The various methods of treatment have been discussed with the patient and family. After consideration of risks, benefits and other options for treatment, the patient has consented to  Procedure(s): BIOPSY TEMPORAL ARTERY (Left) as a surgical intervention.  The patient's history has been reviewed, patient examined, no change in status, stable for surgery.  I have reviewed the patient's chart and labs.  Questions were answered to the patient's satisfaction.     [name]

## 2021-05-21 ENCOUNTER — Encounter: Payer: Self-pay | Admitting: Vascular Surgery

## 2021-05-21 LAB — SURGICAL PATHOLOGY

## 2021-05-22 DIAGNOSIS — H47012 Ischemic optic neuropathy, left eye: Secondary | ICD-10-CM | POA: Diagnosis not present

## 2021-05-22 NOTE — Anesthesia Postprocedure Evaluation (Signed)
Anesthesia Post Note  Patient: Glenn Perez  Procedure(s) Performed: BIOPSY TEMPORAL ARTERY (Left)  Patient location during evaluation: PACU Anesthesia Type: General Level of consciousness: awake and alert Pain management: pain level controlled Vital Signs Assessment: post-procedure vital signs reviewed and stable Respiratory status: spontaneous breathing, nonlabored ventilation and respiratory function stable Cardiovascular status: blood pressure returned to baseline and stable Postop Assessment: no apparent nausea or vomiting Anesthetic complications: no   No notable events documented.   Last Vitals:  Vitals:   05/20/21 1500 05/20/21 1547  BP: 140/71 (!) 165/67  Pulse: (!) 51 (!) 45  Resp: 10 18  Temp:  (!) 36.4 C  SpO2: 99% 98%    Last Pain:  Vitals:   05/20/21 1547  TempSrc: Oral  PainSc:                  [place] Ouch

## 2021-05-24 ENCOUNTER — Telehealth: Payer: Self-pay | Admitting: *Deleted

## 2021-05-24 NOTE — Telephone Encounter (Signed)
Spoke to patient. Tried to set up AWV. Patient informed me that this was not a good time. He was at the hospital. Informed me that he would be on vacation for the next 2 weeks and would try and do AWV after that. I informed him to call the office when he felt it was a good time.

## 2021-05-29 ENCOUNTER — Ambulatory Visit (INDEPENDENT_AMBULATORY_CARE_PROVIDER_SITE_OTHER): Payer: Medicare HMO | Admitting: Vascular Surgery

## 2021-05-29 ENCOUNTER — Other Ambulatory Visit: Payer: Self-pay

## 2021-05-29 ENCOUNTER — Encounter (INDEPENDENT_AMBULATORY_CARE_PROVIDER_SITE_OTHER): Payer: Self-pay | Admitting: Vascular Surgery

## 2021-05-29 VITALS — BP 143/79 | HR 52 | Ht 72.0 in | Wt 166.0 lb

## 2021-05-29 DIAGNOSIS — M316 Other giant cell arteritis: Secondary | ICD-10-CM

## 2021-05-29 NOTE — Progress Notes (Signed)
    Patient ID: Glenn Perez, male   DOB: 1945-11-13, 75 y.o.   MRN: [id number]  No chief complaint on file.   HPI Glenn Perez is a 75 y.o. male.    No c/o denies pain Has follow up with Dr Jefm Bryant  Past Medical History:  Diagnosis Date   Syncope     Past Surgical History:  Procedure Laterality Date   APPENDECTOMY     ARTERY BIOPSY Left 05/20/2021   Procedure: BIOPSY TEMPORAL ARTERY;  Surgeon: Katha Cabal, MD;  Location: [facility] ORS;  Service: Vascular;  Laterality: Left;   KNEE SURGERY     NOSE SURGERY     x 2.   REPLACEMENT TOTAL KNEE     SHOULDER SURGERY  10/2015   for partially torn rotator cuff and detached bicep tendon.      No Known Allergies  Current Outpatient Medications  Medication Sig Dispense Refill   acetaminophen (TYLENOL) 325 MG tablet Take 2 tablets (650 mg total) by mouth every 6 (six) hours as needed for mild pain, fever or headache.     ondansetron (ZOFRAN) 4 MG tablet Take 1 tablet (4 mg total) by mouth every 6 (six) hours as needed for nausea. 20 tablet 0   oxyCODONE (OXY IR/ROXICODONE) 5 MG immediate release tablet Take 1 tablet (5 mg total) by mouth every 4 (four) hours as needed for severe pain. 15 tablet 0   predniSONE (DELTASONE) 20 MG tablet Take 4 tablets (80 mg total) by mouth daily for 14 days. 56 tablet 0   tamsulosin (FLOMAX) 0.4 MG CAPS capsule Take 1 capsule (0.4 mg total) by mouth daily. (Patient not taking: Reported on 05/16/2021) 30 capsule 0   No current facility-administered medications for this visit.        Physical Exam There were no vitals taken for this visit. Gen:  WD/WN, NAD Skin: incision C/D/I     Assessment/Plan: 1. Temporal arteritis ([place]) Patient + for Temp arteritis On Prednisone Follow up PRN      [name] 05/29/2021, 12:45 PM   This note was created with Dragon medical transcription system.  Any errors from dictation are unintentional.

## 2021-06-01 ENCOUNTER — Encounter (INDEPENDENT_AMBULATORY_CARE_PROVIDER_SITE_OTHER): Payer: Self-pay | Admitting: Vascular Surgery

## 2021-06-12 ENCOUNTER — Telehealth: Payer: Self-pay | Admitting: Urology

## 2021-06-12 NOTE — Telephone Encounter (Signed)
Pt called office and needs to s/w Dr Bernardo Heater about his prostate issues.  He's going through some very serious eye/vision issues and has some important questions to ask [facility] pertaining to prostate issues.

## 2021-06-13 ENCOUNTER — Ambulatory Visit (INDEPENDENT_AMBULATORY_CARE_PROVIDER_SITE_OTHER): Payer: Medicare HMO | Admitting: Urology

## 2021-06-13 ENCOUNTER — Other Ambulatory Visit: Payer: Self-pay

## 2021-06-13 DIAGNOSIS — R972 Elevated prostate specific antigen [PSA]: Secondary | ICD-10-CM | POA: Diagnosis not present

## 2021-06-13 DIAGNOSIS — R9389 Abnormal findings on diagnostic imaging of other specified body structures: Secondary | ICD-10-CM

## 2021-06-13 NOTE — Progress Notes (Signed)
Visit via Telephone Note  I connected with Glenn Perez on 06/13/21 at  8:15 AM EDT by telephone and verified that I am speaking with the correct person using two identifiers.  Location: Patient: Home Provider: [facility] Participants: Patient and provider   I discussed the limitations, risks, security and privacy concerns of performing an evaluation and management service by telephone and the availability of in person appointments. I also discussed with the patient that there may be a patient responsible charge related to this service. The patient expressed understanding and agreed to proceed.   History of Present Illness: 75 y.o. male presents for follow-up of an elevated PSA  Initially seen 03/28/2021 for PSA of 5.1 drawn June 2022 Family history prostate cancer Repeat PSA August 2022 remains elevated at 5.4 and prostate MRI recommended which was performed 05/15/2021 and showed a 29 cc gland with a PI-RADS 4 lesion in the mid-anterior apical midline tracking towards the apex Fusion biopsy was recommended  He was hospitalized [facility] 05/16/2021 with visual changes and diagnosed with temporal arteritis.  Placed on high-dose steroids.  Currently on 60 mg prednisone daily   Observations/Objective: Alert, conversant  Assessment and Plan: Elevated PSA with MRI showing lesion suspicious for high-grade prostate cancer No absolute contraindication to prostate biopsy on corticosteroids Will check with Dr. Jefm Bryant regarding his thoughts  Follow Up Instructions: As above   I discussed the assessment and treatment plan with the patient. The patient was provided an opportunity to ask questions and all were answered. The patient agreed with the plan and demonstrated an understanding of the instructions.   The patient was advised to call back or seek an in-person evaluation if the symptoms worsen or if the condition fails to improve as anticipated.  I provided 22 minutes of  non-face-to-face time during this encounter.   Abbie Sons, MD

## 2021-06-16 ENCOUNTER — Telehealth: Payer: Self-pay | Admitting: *Deleted

## 2021-06-16 ENCOUNTER — Other Ambulatory Visit: Payer: Medicare HMO

## 2021-06-16 ENCOUNTER — Telehealth: Payer: Self-pay

## 2021-06-16 ENCOUNTER — Other Ambulatory Visit: Payer: Self-pay

## 2021-06-16 DIAGNOSIS — N390 Urinary tract infection, site not specified: Secondary | ICD-10-CM

## 2021-06-16 DIAGNOSIS — R319 Hematuria, unspecified: Secondary | ICD-10-CM | POA: Diagnosis not present

## 2021-06-16 DIAGNOSIS — N400 Enlarged prostate without lower urinary tract symptoms: Secondary | ICD-10-CM

## 2021-06-16 LAB — MICROSCOPIC EXAMINATION
Bacteria, UA: NONE SEEN
Epithelial Cells (non renal): NONE SEEN /hpf (ref 0–10)

## 2021-06-16 LAB — URINALYSIS, COMPLETE
Bilirubin, UA: NEGATIVE
Glucose, UA: NEGATIVE
Ketones, UA: NEGATIVE
Leukocytes,UA: NEGATIVE
Nitrite, UA: NEGATIVE
Protein,UA: NEGATIVE
Specific Gravity, UA: <1.005 — ABNORMAL LOW (ref 1.005–1.030)
Urobilinogen, Ur: 0.2 mg/dL (ref 0.2–1.0)
pH, UA: 6.5 (ref 5.0–7.5)

## 2021-06-16 NOTE — Telephone Encounter (Signed)
Notified patient as instructed,.  

## 2021-06-16 NOTE — Telephone Encounter (Signed)
Lab visit for UA/urine culture.  Also let him know I am waiting to hear from Dr. Jefm Bryant.  Thanks

## 2021-06-16 NOTE — Telephone Encounter (Signed)
Patient wants to talk to Dr. Rosanna Randy only regarding his "large cell arteritis" and getting him on the same page with Dr. Bernardo Heater and Dr. Jefm Bryant.

## 2021-06-16 NOTE — Addendum Note (Signed)
Addended by: Chrystie Nose on: 06/16/2021 09:16 AM   Modules accepted: Orders

## 2021-06-16 NOTE — Telephone Encounter (Signed)
Please advise 

## 2021-06-16 NOTE — Telephone Encounter (Signed)
Patient called in today and state he notice some blood in his urine on 06/14/2021. Alittle bit of burning with urination . He state as of the today bleeding has got better. Still some but not as much .

## 2021-06-17 ENCOUNTER — Emergency Department: Payer: Medicare HMO

## 2021-06-17 ENCOUNTER — Encounter: Payer: Self-pay | Admitting: Urology

## 2021-06-17 ENCOUNTER — Encounter: Payer: Self-pay | Admitting: Emergency Medicine

## 2021-06-17 ENCOUNTER — Other Ambulatory Visit: Payer: Self-pay

## 2021-06-17 ENCOUNTER — Ambulatory Visit: Payer: Self-pay | Admitting: *Deleted

## 2021-06-17 ENCOUNTER — Emergency Department
Admission: EM | Admit: 2021-06-17 | Discharge: 2021-06-17 | Disposition: A | Discharge status: Home / Self Care | Payer: Medicare HMO | Attending: Emergency Medicine | Admitting: Emergency Medicine

## 2021-06-17 DIAGNOSIS — R319 Hematuria, unspecified: Secondary | ICD-10-CM | POA: Diagnosis not present

## 2021-06-17 DIAGNOSIS — R519 Headache, unspecified: Secondary | ICD-10-CM | POA: Diagnosis not present

## 2021-06-17 DIAGNOSIS — R9431 Abnormal electrocardiogram [ECG] [EKG]: Secondary | ICD-10-CM | POA: Diagnosis not present

## 2021-06-17 DIAGNOSIS — R531 Weakness: Secondary | ICD-10-CM | POA: Insufficient documentation

## 2021-06-17 DIAGNOSIS — R42 Dizziness and giddiness: Secondary | ICD-10-CM | POA: Insufficient documentation

## 2021-06-17 DIAGNOSIS — Z5321 Procedure and treatment not carried out due to patient leaving prior to being seen by health care provider: Secondary | ICD-10-CM | POA: Diagnosis not present

## 2021-06-17 DIAGNOSIS — R350 Frequency of micturition: Secondary | ICD-10-CM | POA: Insufficient documentation

## 2021-06-17 DIAGNOSIS — Z743 Need for continuous supervision: Secondary | ICD-10-CM | POA: Diagnosis not present

## 2021-06-17 HISTORY — DX: Other giant cell arteritis: M31.6

## 2021-06-17 LAB — URINALYSIS, COMPLETE (UACMP) WITH MICROSCOPIC
Bacteria, UA: NONE SEEN
Bilirubin Urine: NEGATIVE
Glucose, UA: NEGATIVE mg/dL
Hgb urine dipstick: NEGATIVE
Ketones, ur: 5 mg/dL — AB
Leukocytes,Ua: NEGATIVE
Nitrite: NEGATIVE
Protein, ur: NEGATIVE mg/dL
Specific Gravity, Urine: 1.014 (ref 1.005–1.030)
Squamous Epithelial / HPF: NONE SEEN (ref 0–5)
pH: 6 (ref 5.0–8.0)

## 2021-06-17 LAB — CBC
HCT: 45.6 % (ref 39.0–52.0)
Hemoglobin: 15.4 g/dL (ref 13.0–17.0)
MCH: 27.4 pg (ref 26.0–34.0)
MCHC: 33.8 g/dL (ref 30.0–36.0)
MCV: 81.1 fL (ref 80.0–100.0)
Platelets: 206 10*3/uL (ref 150–400)
RBC: 5.62 MIL/uL (ref 4.22–5.81)
RDW: 15.9 % — ABNORMAL HIGH (ref 11.5–15.5)
WBC: 8.4 10*3/uL (ref 4.0–10.5)
nRBC: 0 % (ref 0.0–0.2)

## 2021-06-17 LAB — COMPREHENSIVE METABOLIC PANEL
ALT: 23 U/L (ref 0–44)
AST: 19 U/L (ref 15–41)
Albumin: 4.2 g/dL (ref 3.5–5.0)
Alkaline Phosphatase: 59 U/L (ref 38–126)
Anion gap: 9 (ref 5–15)
BUN: 24 mg/dL — ABNORMAL HIGH (ref 8–23)
CO2: 26 mmol/L (ref 22–32)
Calcium: 9.5 mg/dL (ref 8.9–10.3)
Chloride: 100 mmol/L (ref 98–111)
Creatinine, Ser: 1.06 mg/dL (ref 0.61–1.24)
GFR, Estimated: >60 mL/min (ref >60)
Glucose, Bld: 113 mg/dL — ABNORMAL HIGH (ref 70–99)
Potassium: 4.6 mmol/L (ref 3.5–5.1)
Sodium: 135 mmol/L (ref 135–145)
Total Bilirubin: 1 mg/dL (ref 0.3–1.2)
Total Protein: 6.6 g/dL (ref 6.5–8.1)

## 2021-06-17 LAB — TROPONIN I (HIGH SENSITIVITY)
Troponin I (High Sensitivity): 13 ng/L (ref <18)
Troponin I (High Sensitivity): 11 ng/L (ref <18)

## 2021-06-17 NOTE — Telephone Encounter (Signed)
Pts wife, Opal Sidles, calling on behalf of pt. She states that the pt is suffering with complications from surgery. Pt is refusing to speak with anyone other than PCP and is requesting to have a call back today. Please advise.

## 2021-06-17 NOTE — ED Triage Notes (Signed)
Pt has hx of Giant cell arteritis, states has had more frequent urination, dizziness and generalized weakness. Pt is on prednisone at this time for above diagnosis, also seeing urologist for prostate problems.

## 2021-06-17 NOTE — Telephone Encounter (Signed)
Pt's wife calling, did not speak to pt. Reports pt with increased weakness, "Can hardly walk." States is not speaking much, not making sense. Reports pt has blood in his urine, urine checked over weekend, neg UTI. Provided VS, 139/72  HR54  t 97.7. States pt is hot and sweaty. Also reports pt "Almost passed out twice today from dizziness and weakness." States pt eating and staying hydrated. Reports pt has been getting weaker since home from hospital 05/16/21, worsening each day. States "I know something is very wrong and I am going to call for transport to ED."  Assured NT would route to practice for PCPs review. States she is calling EMS but would like to speak with Dr. Rosanna Randy regarding care. Feels as through care with specialists is not coordinated well.  CB# [phone] States "Call anytime."

## 2021-06-17 NOTE — ED Triage Notes (Signed)
First Nurse Note:  Pt coming from home [facility] being treated for Giant cell ateritis and prostate concerns. Having pain with urination increased frequency. He is having more weakness also.   B/P 140/76 98% 61 HR  Is on prednisone, he was discharged about three weeks ago feels that he is not getting better.

## 2021-06-17 NOTE — ED Provider Notes (Signed)
Emergency Medicine Provider Triage Evaluation Note  Glenn Perez , a 75 y.o. male with a history of giant cell arteritis and elevated PSA, presents to the emergency department with dizziness, increased falls, headache, weight loss and generalized weakness since starting 1000 mg of prednisone daily for giant cell arteritis.  Patient's wife thinks that symptoms are associated with high-dose prednisone but wants to exclude other potential causes.  No chest pain, chest tightness or abdominal pain.  Patient has been able to ambulate but states that he feels unstable due to dizziness.  He has been afebrile at home.  Review of Systems  Positive: Patient has headache, dizziness, increased urinary frequency and hematuria. Negative: No chest pain, chest tightness or abdominal pain.  Physical Exam  BP 117/83 (BP Location: Right Arm)   Pulse 73   Temp 97.7 F (36.5 C) (Oral)   Resp 16   Ht 6' (1.829 m)   Wt 75 kg   SpO2 100%   BMI 22.42 kg/m  Gen:   Awake, no distress   Resp:  Normal effort  MSK:   Moves extremities without difficulty.  Patient has symmetric strength in upper and lower extremities.  Cranial nerves II through XII are intact.   Medical Decision Making  Medically screening exam initiated at 7:16 PM.  Appropriate orders placed.  Glenn Perez was informed that the remainder of the evaluation will be completed by another provider, this initial triage assessment does not replace that evaluation, and the importance of remaining in the ED until their evaluation is complete.  Assessment and plan.   75 year old male presents to the emergency department with dizziness, increased falls and weight loss at home.  Vital signs are reassuring at triage.  No neurodeficits were noted on exam.  Will obtain basic labs.  Will obtain troponin, EKG and two-view chest x-ray as well as CT head without contrast.  Given increased urinary frequency and reports of hematuria, will obtain  urinalysis and will reassess.    Vallarie Mare [place], PA-C 06/17/21 Karin Golden, MD 06/17/21 [id number]

## 2021-06-18 ENCOUNTER — Telehealth: Payer: Self-pay

## 2021-06-18 NOTE — Telephone Encounter (Signed)
Please advise 

## 2021-06-18 NOTE — Telephone Encounter (Signed)
Copied from [place] [id number]. Topic: General - Inquiry >> Jun 18, 2021  8:10 AM Loma Boston wrote: Reason for CRM: Opal Sidles, pt wife states spent the day in ER last nite, husband's health dramatically seems in decline, been hospitalized,  released back to ED... seems to be at a critical, crossroad,need and wanted  very much to sit down with Dr Darnell Level for a few min. to have an understanding, insight of all changes on CT, MRI, scans to evaluate the  next steps to be taken from here. Wants a special message to be sent back to Dr G to know what to get in place now to be proactive. Drs to make appt with etc.  If at anyway can be seen within the next day or so would be so grateful for his guidance,Please fu with Opal Sidles his wife... [phone]

## 2021-06-20 ENCOUNTER — Ambulatory Visit: Payer: Self-pay | Admitting: *Deleted

## 2021-06-20 ENCOUNTER — Telehealth: Payer: Self-pay | Admitting: Urology

## 2021-06-20 LAB — CULTURE, URINE COMPREHENSIVE

## 2021-06-20 NOTE — Telephone Encounter (Signed)
Patient's wife called the office today to report that Dr. Bernardo Heater and Dr. Jefm Bryant have discussed the prostate biopsy procedure at [facility] in [place].  They have decided that it is okay for the patient to proceed with scheduling the procedure with Dr. Abner Greenspan at [facility].    I provided the patient's wife with the contact information at [facility] to get the procedure scheduled.

## 2021-06-20 NOTE — Telephone Encounter (Signed)
I spoke with Glenn Perez and his wife.  His prednisone has been decreased to 60 mg daily and he has an appointment with Dr. Jefm Bryant next week and his dose may be decreased further.  He is having significant steroid side effects.  She has been in contact with Tammy at [facility] regarding scheduling of his fusion biopsy and has potential dates of October 11 and October 19.

## 2021-06-20 NOTE — Telephone Encounter (Signed)
Appointment scheduled for next week.

## 2021-06-20 NOTE — Telephone Encounter (Signed)
Spoke with patient's wife Opal Sidles. Was able to work in the patient at 11:20 on 10/5 with Dr. Rosanna Randy. She feels that he can not wait that long. I advised that this was our only opening. Patient is also having emotional/mental trauma due to recent illness. Patient has a Social worker that is provided though his insurance Scientist, clinical (histocompatibility and immunogenetics)). Agreed with patient's wife that this would be helpful. In the meantime, if patient's symptoms worsen, it was recommended that he be seen at the ER. Patient's wife is reluctant to take him back after waiting for 7hrs last time. She reports that they left before being seen. Advised to keep [place] updated on the patient's condition, and if we need to get other specialists involved we can possibly place a referral.

## 2021-06-20 NOTE — Telephone Encounter (Signed)
Reason for Disposition  Patient sounds very sick or weak to the triager  Answer Assessment - Initial Assessment Questions 1. DESCRIPTION: "Describe how you  are feeling."     Wife calling in.  Glenn Perez  He has giant arteritis that is serious.   He was in hospital 2 1/2 weeks ago.   The arteries in head inflamed.   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   He was given a lot of steroids.   He is having side effects. He is having trouble with processing words, he is so weak and dizzy almost passing out.    I had to call 911 3 days ago.   He went to the ED.   He had some tests.   I took him home.   We were there 7 hrs.   He was so weak.   He wasn't on a bed.   He could not sit in the chair that long.  I took him home and put him to bed.   Everything is my MyChart.   He is seeing a rheumatologist.   He is tapering the steroids.   He is loosing wt.   I need to talk with Dr. Rosanna Randy.    He is so weak.   Everything is on MyChart.   He has prostate problems that need a biopsy.    The side effects of the steroids are awful.   If there is another experienced dr that is willing to talk with [place]?   I would rather talk with Dr. Rosanna Randy.    My husband has never been so sick like this.  I've called there and no one is calling me back.  Should  I change drs?   He had surgery on his artery. 2. SEVERITY: "How bad is it?"  "Can you stand and walk?"   - MILD - Feels weak or tired, but does not interfere with work, school or normal activities   - Badger to stand and walk; weakness interferes with work, school, or normal activities   - SEVERE - Unable to stand or walk     He can stand and go to the bathroom but he is weak.   He has lost wt.   150 lbs now.   158 a wk ago.    He has lost so much weight. 3. ONSET:  "When did the weakness begin?"     Surgery was around Aug. 30, 2022 On temporal artery in head.  He has been getting weaker since surgery.   He was to have a prostate biopsy  before all this happened.   It was cancelled due to the artery situation. 4. CAUSE: "What do you think is causing the weakness?"     He has had so many steroids but I don't know what's wrong with him.   5. MEDICINES: "Have you recently started a new medicine or had a change in the amount of a medicine?"     Steroids.   He is still on steroids.    6. OTHER SYMPTOMS: "Do you have any other symptoms?" (e.g., chest pain, fever, cough, SOB, vomiting, diarrhea, bleeding, other areas of pain)     Dizzy, weakness, losing weight, eating good 3 meals/day.     No diarrhea, nausea, vomiting.   He is so exhausted.   No URI symptoms. 7. PREGNANCY: "Is there any chance you are pregnant?" "When was your last menstrual period?"  N/A  Protocols used: Weakness (Generalized) and Fatigue-A-AH

## 2021-06-20 NOTE — Telephone Encounter (Signed)
Pt's wife Domenic Schoenberger called in very concerned about her husband who is becoming weaker, having some dizziness and is loosing weight.   They already been to the ED recently.   See my triage notes for the complete conversation.  She is requesting to speak with Dr. Rosanna Randy or another experienced provider regarding her husband's condition.  Dr. Rosanna Randy is out of the office today.   I called into [facility] with Opal Sidles on hold and spoke with [place].    I stayed on the line for several minutes while Opal Sidles and I talked.   Rachelle had answered and we were on hold as she was checking with the providers there.    Opal Sidles decided it would be best if they call her back after a few minutes.   Her cell number is [phone].   I hung up and called Rachelle back at [facility].    She was trying to get in contact with Dr. Rosanna Randy directly to see if he could talk with Opal Sidles.   Ernst Bowler said she would call Opal Sidles back so I gave her Jane's cell number.

## 2021-06-23 DIAGNOSIS — R634 Abnormal weight loss: Secondary | ICD-10-CM | POA: Diagnosis not present

## 2021-06-23 DIAGNOSIS — M316 Other giant cell arteritis: Secondary | ICD-10-CM | POA: Diagnosis not present

## 2021-06-23 DIAGNOSIS — R55 Syncope and collapse: Secondary | ICD-10-CM | POA: Diagnosis not present

## 2021-06-25 ENCOUNTER — Ambulatory Visit (INDEPENDENT_AMBULATORY_CARE_PROVIDER_SITE_OTHER): Payer: Medicare HMO | Admitting: Family Medicine

## 2021-06-25 ENCOUNTER — Other Ambulatory Visit: Payer: Self-pay

## 2021-06-25 ENCOUNTER — Encounter: Payer: Self-pay | Admitting: Family Medicine

## 2021-06-25 VITALS — BP 115/66 | HR 54 | Temp 97.7°F | Wt 159.0 lb

## 2021-06-25 DIAGNOSIS — U099 Post covid-19 condition, unspecified: Secondary | ICD-10-CM | POA: Diagnosis not present

## 2021-06-25 DIAGNOSIS — R69 Illness, unspecified: Secondary | ICD-10-CM | POA: Diagnosis not present

## 2021-06-25 DIAGNOSIS — M316 Other giant cell arteritis: Secondary | ICD-10-CM | POA: Diagnosis not present

## 2021-06-25 DIAGNOSIS — F320 Major depressive disorder, single episode, mild: Secondary | ICD-10-CM | POA: Diagnosis not present

## 2021-06-25 DIAGNOSIS — R972 Elevated prostate specific antigen [PSA]: Secondary | ICD-10-CM | POA: Diagnosis not present

## 2021-06-25 DIAGNOSIS — R5383 Other fatigue: Secondary | ICD-10-CM

## 2021-06-25 MED ORDER — MIRTAZAPINE 30 MG PO TABS: 30.0000 mg | ORAL_TABLET | Freq: Every day | ORAL | 5 refills | Status: DC

## 2021-06-25 NOTE — Progress Notes (Signed)
Established patient visit   Patient: Glenn Perez   DOB: 01-13-1946   75 y.o. Male  MRN: [id number] Visit Date: 06/25/2021  Today's healthcare provider: Wilhemena Durie, MD   Chief Complaint  Patient presents with   Fatigue   Subjective    HPI  Patient comes in today for follow-up of temporal arteritis which started with visual changes. He has been on high-dose prednisone and feels terrible since being on the prednisone.  He is not sleeping and feels depressed and completely washed out.  He is not suicidal or homicidal. He does not like not feeling well.  He has accompanied by his wife today. Fatigue  He reports new onset fatigue which he describes as a lack of energy and feeling weak. It began about a month ago and occurs all the time. It is described as severe and staying constant. He has started new medications around the time the fatigue started. Prednisone for giant cell arteritis.  Associated symptoms: Yes arthralgias No bleeding  No melena No chest discomfort  No heart palpitations No heart racing   No dyspnea Yes feeling depressed  No feeling anxious or under stress No fevers  No loss of appetite No nausea  No vomiting No sleeping problems    Wt Readings from Last 3 Encounters:  06/25/21 159 lb (72.1 kg)  06/17/21 165 lb 5.5 oz (75 kg)  05/29/21 166 lb (75.3 kg)    Lab Results  Component Value Date   WBC 8.4 06/17/2021   HGB 15.4 06/17/2021   HCT 45.6 06/17/2021   MCV 81.1 06/17/2021   PLT 206 06/17/2021   Lab Results  Component Value Date   TSH 3.000 03/12/2021   Lab Results  Component Value Date   NA 135 06/17/2021   K 4.6 06/17/2021   CO2 26 06/17/2021   BUN 24 (H) 06/17/2021   CREATININE 1.06 06/17/2021   CALCIUM 9.5 06/17/2021   GLUCOSE 113 (H) 06/17/2021     ---------------------------------------------------------------------------------------------------     Medications: Outpatient Medications Prior to Visit   Medication Sig   predniSONE (DELTASONE) 20 MG tablet Take by mouth.   No facility-administered medications prior to visit.    Review of Systems  Constitutional:  Positive for activity change, appetite change, fatigue and unexpected weight change. Negative for chills and fever.  Respiratory: Negative.  Negative for chest tightness, shortness of breath and wheezing.   Cardiovascular: Negative.  Negative for chest pain and palpitations.  Gastrointestinal: Negative.  Negative for abdominal pain, nausea and vomiting.  Genitourinary:  Positive for hematuria (Last week but none since. Followed by Urology.).  Neurological:  Positive for dizziness. Negative for light-headedness and headaches.  Psychiatric/Behavioral:  Positive for dysphoric mood and sleep disturbance. Negative for self-injury and suicidal ideas.       Objective    BP 115/66 (BP Location: Left Arm, Patient Position: Sitting, Cuff Size: Normal)   Pulse (!) 54   Temp 97.7 F (36.5 C) (Oral)   Wt 159 lb (72.1 kg)   SpO2 100%   BMI 21.56 kg/m    Physical Exam Vitals reviewed.  Constitutional:      Appearance: Normal appearance.  HENT:     Head: Normocephalic and atraumatic.     Right Ear: External ear normal.     Left Ear: External ear normal.     Nose: Nose normal.     Mouth/Throat:     Mouth: Mucous membranes are moist.     Pharynx:  Oropharynx is clear.  Eyes:     Extraocular Movements: Extraocular movements intact.     Conjunctiva/sclera: Conjunctivae normal.     Pupils: Pupils are equal, round, and reactive to light.  Cardiovascular:     Rate and Rhythm: Normal rate and regular rhythm.     Pulses: Normal pulses.     Heart sounds: Normal heart sounds.  Pulmonary:     Effort: Pulmonary effort is normal.     Breath sounds: Normal breath sounds.  Abdominal:     General: Abdomen is flat. Bowel sounds are normal.     Palpations: Abdomen is soft.  Musculoskeletal:     Cervical back: Neck supple.  Skin:     General: Skin is warm and dry.  Neurological:     General: No focal deficit present.     Mental Status: He is alert and oriented to person, place, and time.  Psychiatric:        Behavior: Behavior normal.        Thought Content: Thought content normal.        Judgment: Judgment normal.      No results found for any visits on 06/25/21.  Assessment & Plan     1. Temporal arteritis ([place]) Patient continues on high-dose prednisone tapering down by rheumatology.  Treatment per rheumatology More than 50% 25 minute visit spent in counseling or coordination of care  2. Depression, major, single episode, mild ([place]) This could be a reactive depression all due to not feeling well for temporal arteritis.  However due to insomnia and weight loss we will try mirtazapine 30 mg at night and follow-up in a month.  3. Elevated PSA Has PSA biopsy scheduled in the near future  4. Fatigue, unspecified type Multifactorial.  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      I, Wilhemena Durie, MD, have reviewed all documentation for this visit. The documentation on 06/29/21 for the exam, diagnosis, procedures, and orders are all accurate and complete.    Saren Corkern [place] Mon, MD  [facility] [phone] (phone) [phone] (fax)  [place]

## 2021-06-25 NOTE — Patient Instructions (Signed)
Do not pick up Trazodone.   Call Eugenia Pancoast [phone]

## 2021-06-26 ENCOUNTER — Ambulatory Visit: Payer: Medicare HMO | Admitting: Family Medicine

## 2021-07-01 ENCOUNTER — Telehealth: Payer: Self-pay

## 2021-07-01 ENCOUNTER — Other Ambulatory Visit: Payer: Self-pay | Admitting: Ophthalmology

## 2021-07-01 DIAGNOSIS — H47012 Ischemic optic neuropathy, left eye: Secondary | ICD-10-CM | POA: Diagnosis not present

## 2021-07-01 DIAGNOSIS — M316 Other giant cell arteritis: Secondary | ICD-10-CM

## 2021-07-01 DIAGNOSIS — H469 Unspecified optic neuritis: Secondary | ICD-10-CM

## 2021-07-01 NOTE — Telephone Encounter (Signed)
Referral to Neuro ophthalmology.

## 2021-07-09 ENCOUNTER — Ambulatory Visit: Payer: Medicare HMO

## 2021-07-09 DIAGNOSIS — C61 Malignant neoplasm of prostate: Secondary | ICD-10-CM | POA: Diagnosis not present

## 2021-07-09 DIAGNOSIS — R972 Elevated prostate specific antigen [PSA]: Secondary | ICD-10-CM | POA: Diagnosis not present

## 2021-07-11 ENCOUNTER — Other Ambulatory Visit: Payer: Self-pay

## 2021-07-11 ENCOUNTER — Ambulatory Visit
Admission: RE | Admit: 2021-07-11 | Discharge: 2021-07-11 | Disposition: A | Discharge status: Home / Self Care | Payer: Medicare HMO | Source: Ambulatory Visit | Attending: Ophthalmology | Admitting: Ophthalmology

## 2021-07-11 DIAGNOSIS — G319 Degenerative disease of nervous system, unspecified: Secondary | ICD-10-CM | POA: Diagnosis not present

## 2021-07-11 DIAGNOSIS — H469 Unspecified optic neuritis: Secondary | ICD-10-CM | POA: Diagnosis not present

## 2021-07-11 DIAGNOSIS — G9389 Other specified disorders of brain: Secondary | ICD-10-CM | POA: Diagnosis not present

## 2021-07-15 ENCOUNTER — Ambulatory Visit: Payer: Medicare HMO | Admitting: Family Medicine

## 2021-07-15 ENCOUNTER — Other Ambulatory Visit: Payer: Self-pay | Admitting: Urology

## 2021-07-17 ENCOUNTER — Other Ambulatory Visit: Payer: Self-pay | Admitting: Family Medicine

## 2021-07-17 ENCOUNTER — Telehealth: Payer: Self-pay | Admitting: Urology

## 2021-07-17 NOTE — Telephone Encounter (Signed)
I contacted Glenn Perez to discuss his prostate biopsy results.  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 Vidales and his wife the pathology results with risk stratification of very low risk prostate cancer.  We discussed the preferred management option is active surveillance which was discussed in detail and include every 6 month office visits for PSA and annual DRE.  The recommendations of a confirmatory biopsy 1-2 years after his initial biopsy was discussed and would repeat MRI prior to confirmatory biopsy.  Treatment options were reviewed including radical prostatectomy and radiation modalities.  I would recommend active surveillance which he has elected.  Will schedule follow-up office visit 6 months with PSA

## 2021-07-18 ENCOUNTER — Telehealth: Payer: Self-pay | Admitting: Urology

## 2021-07-18 NOTE — Telephone Encounter (Signed)
I reached out to the patient and scheduled his 6 month f/up appts. He had a question and asked me to pass it along to you. He stated that for the last 6 months he is having an issue with frequent urination. He is going every 45 minutes to an hour. He would like to know what can be done about it? He said that it is getting harder to hold his urine especially if he is drinking something. I told him that someone would probably be getting back to him on Monday as it is late in the day on Friday afternoon and he was OK with that. Please have someone reach out to him to discuss.  Thanks, Sharyn Lull

## 2021-07-21 ENCOUNTER — Encounter: Payer: Self-pay | Admitting: *Deleted

## 2021-07-21 ENCOUNTER — Other Ambulatory Visit: Payer: Self-pay | Admitting: *Deleted

## 2021-07-21 MED ORDER — TAMSULOSIN HCL 0.4 MG PO CAPS: 0.4000 mg | ORAL_CAPSULE | Freq: Every day | ORAL | 0 refills | Status: DC

## 2021-07-21 NOTE — Telephone Encounter (Signed)
Recommend trial tamsulosin 0.4 mg daily x30 days.  Call back regarding efficacy

## 2021-07-25 DIAGNOSIS — M316 Other giant cell arteritis: Secondary | ICD-10-CM | POA: Diagnosis not present

## 2021-07-25 DIAGNOSIS — R634 Abnormal weight loss: Secondary | ICD-10-CM | POA: Diagnosis not present

## 2021-07-25 DIAGNOSIS — R55 Syncope and collapse: Secondary | ICD-10-CM | POA: Diagnosis not present

## 2021-07-29 ENCOUNTER — Telehealth: Payer: Self-pay

## 2021-07-29 ENCOUNTER — Ambulatory Visit (INDEPENDENT_AMBULATORY_CARE_PROVIDER_SITE_OTHER): Payer: Medicare HMO | Admitting: Family Medicine

## 2021-07-29 ENCOUNTER — Encounter: Payer: Self-pay | Admitting: Family Medicine

## 2021-07-29 ENCOUNTER — Other Ambulatory Visit: Payer: Self-pay

## 2021-07-29 VITALS — BP 115/58 | HR 60 | Temp 97.8°F | Resp 16 | Ht 72.0 in | Wt 171.0 lb

## 2021-07-29 DIAGNOSIS — I480 Paroxysmal atrial fibrillation: Secondary | ICD-10-CM

## 2021-07-29 DIAGNOSIS — F320 Major depressive disorder, single episode, mild: Secondary | ICD-10-CM

## 2021-07-29 DIAGNOSIS — B354 Tinea corporis: Secondary | ICD-10-CM | POA: Diagnosis not present

## 2021-07-29 DIAGNOSIS — C61 Malignant neoplasm of prostate: Secondary | ICD-10-CM | POA: Diagnosis not present

## 2021-07-29 DIAGNOSIS — R69 Illness, unspecified: Secondary | ICD-10-CM | POA: Diagnosis not present

## 2021-07-29 DIAGNOSIS — M316 Other giant cell arteritis: Secondary | ICD-10-CM | POA: Diagnosis not present

## 2021-07-29 MED ORDER — KETOCONAZOLE 2 % EX CREA: 1.0000 "application " | TOPICAL_CREAM | Freq: Every day | CUTANEOUS | 0 refills | Status: DC

## 2021-07-29 NOTE — Telephone Encounter (Signed)
Copied from [place] [id number]. Topic: Quick Communication - Rx Refill/Question >> Jul 29, 2021  3:04 PM Erick Blinks wrote: Pt says he was supposed to be prescribed an ointment for a rash that he has, this was discussed during his visit today and he still does not have this available at his pharmacy. [id number] [address], [place] [address] Phone: [phone] Fax: [phone]

## 2021-07-29 NOTE — Progress Notes (Signed)
I,April Miller,acting as a scribe for Wilhemena Durie, MD.,have documented all relevant documentation on the behalf of Wilhemena Durie, MD,as directed by  Wilhemena Durie, MD while in the presence of Wilhemena Durie, MD.  Established patient visit   Patient: Glenn Perez   DOB: Sep 11, 1946   75 y.o. Male  MRN: [id number] Visit Date: 07/29/2021  Today's healthcare provider: Wilhemena Durie, MD   Chief Complaint  Patient presents with   Follow-up   Depression   Subjective    HPI  Patient now comes in alone.  Mental status is back to normal for him.  Evidently he was having great difficulty dealing with the stress of having health have challenges. MRI showed mild atrophy of the brain.  Prostate biopsy showed 1 out of 16 had cancer on biopsy so they are going to do watchful waiting for this. Vision is being followed by ophthalmology for his temporal arteritis. Rheumatology following him for treatment of the temporal arteritis. Has a slight rash in the groin on both sides.    Depression, Follow-up  He  was last seen for this 1 months ago. Changes made at last visit include adding Mirtazipine.   He reports poor compliance with treatment. He is not having side effects. none  He reports good tolerance of treatment. Current symptoms include:  n/a He feels he is Unchanged since last visit.  Depression screen [facility] 2/9 06/25/2021 10/19/2018 10/19/2018  Decreased Interest 2 0 0  Down, Depressed, Hopeless 2 0 0  PHQ - 2 Score 4 0 0  Altered sleeping 1 0 -  Tired, decreased energy 3 1 -  Change in appetite 0 1 -  Feeling bad or failure about yourself  0 0 -  Trouble concentrating 2 0 -  Moving slowly or fidgety/restless 2 0 -  Suicidal thoughts 0 0 -  PHQ-9 Score 12 2 -  Difficult doing work/chores Somewhat difficult Not difficult at all -     -----------------------------------------------------------------------------------------     Medications: Outpatient Medications Prior to Visit  Medication Sig   predniSONE (DELTASONE) 20 MG tablet Take by mouth.   mirtazapine (REMERON) 30 MG tablet TAKE 1 TABLET BY MOUTH AT BEDTIME. (Patient not taking: Reported on 07/29/2021)   [DISCONTINUED] tamsulosin (FLOMAX) 0.4 MG CAPS capsule Take 1 capsule (0.4 mg total) by mouth daily.   No facility-administered medications prior to visit.    Review of Systems      Objective    BP (!) 115/58 (BP Location: Left Arm, Patient Position: Sitting, Cuff Size: Large)   Pulse 60   Temp 97.8 F (36.6 C) (Temporal)   Resp 16   Ht 6' (1.829 m)   Wt 171 lb (77.6 kg)   SpO2 99%   BMI 23.19 kg/m  BP Readings from Last 3 Encounters:  07/29/21 (!) 115/58  06/25/21 115/66  06/17/21 114/76   Wt Readings from Last 3 Encounters:  07/29/21 171 lb (77.6 kg)  06/25/21 159 lb (72.1 kg)  06/17/21 165 lb 5.5 oz (75 kg)      Physical Exam Vitals reviewed.  Constitutional:      General: He is not in acute distress.    Appearance: He is well-developed.  HENT:     Head: Normocephalic and atraumatic.     Right Ear: Hearing normal.     Left Ear: Hearing normal.     Nose: Nose normal.  Eyes:     General: Lids are normal. No  scleral icterus.       Right eye: No discharge.        Left eye: No discharge.     Conjunctiva/sclera: Conjunctivae normal.  Cardiovascular:     Rate and Rhythm: Normal rate and regular rhythm.     Heart sounds: Normal heart sounds.  Pulmonary:     Effort: Pulmonary effort is normal. No respiratory distress.  Skin:    General: Skin is warm and dry.     Findings: Rash present. No lesion.     Comments: Small rash on either side of his groin consistent ringworm  Neurological:     General: No focal deficit present.     Mental Status: He is alert and oriented to person, place, and time.  Psychiatric:        Mood and  Affect: Mood normal.        Speech: Speech normal.        Behavior: Behavior normal.        Thought Content: Thought content normal.        Judgment: Judgment normal.      No results found for any visits on 07/29/21.  Assessment & Plan    1. Depression, major, single episode, mild (HCC) Took a total of 2 doses of Remeron.  He says it did not help him.  Strongly recommend counseling.  2. Temporal arteritis ([place]) By rheumatology and ophthalmology  3. Prostate cancer [facility]) Watchful waiting now by urology  4. Ringworm of body Treat with ketoconazole  5. Paroxysmal A-fib ([place]) Followed by cardiology     Return in about 4 months (around 11/26/2021).      I, Wilhemena Durie, MD, have reviewed all documentation for this visit. The documentation on 08/02/21 for the exam, diagnosis, procedures, and orders are all accurate and complete.    Zacory Fiola Cranford Mon, MD  [facility] [phone] (phone) [phone] (fax)  [place]

## 2021-07-30 NOTE — Telephone Encounter (Signed)
Sent!

## 2021-07-31 DIAGNOSIS — M316 Other giant cell arteritis: Secondary | ICD-10-CM | POA: Diagnosis not present

## 2021-07-31 DIAGNOSIS — R634 Abnormal weight loss: Secondary | ICD-10-CM | POA: Diagnosis not present

## 2021-08-01 DIAGNOSIS — H47012 Ischemic optic neuropathy, left eye: Secondary | ICD-10-CM | POA: Diagnosis not present

## 2021-08-08 DIAGNOSIS — M316 Other giant cell arteritis: Secondary | ICD-10-CM | POA: Diagnosis not present

## 2021-08-21 DIAGNOSIS — H53132 Sudden visual loss, left eye: Secondary | ICD-10-CM | POA: Diagnosis not present

## 2021-08-25 ENCOUNTER — Telehealth: Payer: Self-pay | Admitting: Family Medicine

## 2021-08-25 NOTE — Telephone Encounter (Signed)
Patient called trying to get appt for Dec 8 with Dr Rollene Rotunda. Please call back

## 2021-08-27 ENCOUNTER — Encounter: Payer: Self-pay | Admitting: Family Medicine

## 2021-08-27 ENCOUNTER — Ambulatory Visit (INDEPENDENT_AMBULATORY_CARE_PROVIDER_SITE_OTHER): Payer: Medicare HMO | Admitting: Family Medicine

## 2021-08-27 ENCOUNTER — Other Ambulatory Visit: Payer: Self-pay

## 2021-08-27 VITALS — BP 128/77 | HR 56 | Temp 97.8°F | Resp 16 | Wt 177.1 lb

## 2021-08-27 DIAGNOSIS — S76019A Strain of muscle, fascia and tendon of unspecified hip, initial encounter: Secondary | ICD-10-CM | POA: Insufficient documentation

## 2021-08-27 DIAGNOSIS — S76012A Strain of muscle, fascia and tendon of left hip, initial encounter: Secondary | ICD-10-CM | POA: Diagnosis not present

## 2021-08-27 DIAGNOSIS — M7918 Myalgia, other site: Secondary | ICD-10-CM

## 2021-08-27 NOTE — Progress Notes (Signed)
    SUBJECTIVE:   CHIEF COMPLAINT / HPI:   HIP PAIN - noticing L upper buttock pain since playing tennis for the first time in 3 months on Tuesday. Previously played very reguarly. 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 Currently taking 10mg  prednisone.  - when first noticed had planted L leg and volleyed across body with R arm and felt twinge in L upper buttock. - since has had pain in same area with certain movements, going up stairs. - walking and going down stairs doesn't seem to bother.  - denies radiation of pain down leg, numbness/tingling, weakness, swelling, redness, fevers - took 400mg  ibuprofen, thinks it helped - pain is improved from previous - wants to know what exercises he can do to help   OBJECTIVE:   BP 128/77 (BP Location: Left Arm, Patient Position: Sitting, Cuff Size: Large)   Pulse (!) 56   Temp 97.8 F (36.6 C) (Oral)   Resp 16   Wt 177 lb 1.6 oz (80.3 kg)   BMI 24.02 kg/m   Gen: well appearing, in NAD MSK:  - Inspection: no overlying skin changes - Palpation: nonTTP over gluteal muscles, piriformis, lumbar paravertebral musculature, greater trochanter - ROM: full AROM - Strength: 5/5 LE strength with flexion/extension - Stability: no joint laxity - Special Tests: negative FADIR/FABER - Neurovascular: intact   ASSESSMENT/PLAN:   Muscle strain of gluteal region Discussed options regarding PT, continued conservative care, referral to sports medicine. Elected to refer to Sports Medicine for further eval and targeted rehab.     Myles Gip, DO

## 2021-08-27 NOTE — Patient Instructions (Signed)
Gluteal Strain A gluteal strain happens when the muscles in the buttocks (gluteal muscles) are overstretched or torn. A tear can be partial or complete. A gluteal strain can cause pain and stiffness in your buttocks, legs, and lower back. A strain might also be called "pulling a muscle." The severity of a gluteal strain is rated as Grade 1, 2, or 3. A Grade 3 strain has the most tearing and pain. What are the causes? This condition may be caused by: Stretching the muscles too far. Putting too much stress on the muscles before they are warmed up. Overusing the muscles. Repetitive muscle movements over long periods of time. Injury. What increases the risk? The following factors may make you more likely to develop this condition: Being out in cold weather. Being physically tired. Having poor strength and flexibility. Not warming up properly before physical activity. Exercising or playing sports with sudden bursts of activity. Having poor exercise techniques. What are the signs or symptoms? Symptoms of this condition include: Pain in the buttocks, especially when moving the legs. Pain may spread to the lower back or to the legs. Bruising and swelling of the buttocks. Tenderness, weakness, or stiffness in the buttocks. Muscle spasms. How is this diagnosed? This condition is diagnosed based on: A physical exam. Your medical history. How well you can do certain range of motion exercises. Imaging tests, such as MRI, ultrasound, or X-rays. Your strain may be rated based on how severe it is. The ratings are: Grade 1 strain (mild). Muscles are overstretched. You might have very small muscle tears. This type of strain generally heals in about one week. Grade 2 strain (moderate). Muscles are partially torn. This may take one to two months to heal. Grade 3 strain (severe). Muscles are completely torn. A severe strain can take more than three months to heal. Grade 3 gluteal strains are rare. How  is this treated? This condition may be treated with: Resting, icing, and raising (elevating) the injured area as much as possible. Over-the-counter pain medicines. If your gluteal strain is severe or very painful, your health care provider may prescribe pain medicines or physical therapy. Surgery for severe strains is rare. Follow these instructions at home: Managing pain, stiffness, and swelling  If directed, put ice on the injured area: Put ice in a plastic bag. Place a towel between your skin and the bag. Leave the ice on for 20 minutes, 2-3 times per day. Activity Do not drive or use heavy machinery while taking prescription pain medicine. Return to your normal activities as told by your health care provider. Ask your health care provider what activities are safe for you. Rest your gluteal muscles as much as possible, especially for the first 2-3 days. Begin exercising or stretching as told by your health care provider. General instructions Take over-the-counter and prescription medicines only as told by your health care provider. Follow your treatment plan as told by your health care provider. This may include: Physical therapy. Massage. Local electrical stimulation (transcutaneous electrical nerve stimulation, TENS). Keep all follow-up visits. This is important. How is this prevented? Warm up and stretch before physical activity. Stretch after physical activity. Learn and use correct techniques for exercising and playing sports. Avoid difficult physical activities if your muscles are tired or sore. Strengthen your gluteal muscles and the surrounding muscles. Develop your flexibility by stretching at least once a day. Contact a health care provider if: You have pain or swelling that gets worse or does not get better with  medicine. Summary A gluteal strain happens when the muscles in the buttocks (gluteal muscles) are overstretched or torn. A gluteal strain can cause pain and  stiffness in your buttocks, legs, and lower back. If your gluteal strain is severe or very painful, your health care provider may prescribe pain medicines or physical therapy. This information is not intended to replace advice given to you by your health care provider. Make sure you discuss any questions you have with your health care provider. Document Revised: 02/05/2021 Document Reviewed: 02/05/2021 Elsevier Patient Education  [place].

## 2021-08-27 NOTE — Assessment & Plan Note (Addendum)
Discussed options regarding PT, continued conservative care, referral to sports medicine. Elected to refer to Sports Medicine for further eval and targeted rehab.

## 2021-09-02 ENCOUNTER — Ambulatory Visit (INDEPENDENT_AMBULATORY_CARE_PROVIDER_SITE_OTHER): Payer: Medicare HMO | Admitting: Family Medicine

## 2021-09-02 ENCOUNTER — Other Ambulatory Visit: Payer: Self-pay

## 2021-09-02 ENCOUNTER — Encounter: Payer: Self-pay | Admitting: Family Medicine

## 2021-09-02 VITALS — BP 124/68 | HR 58 | Ht 72.0 in | Wt 175.0 lb

## 2021-09-02 DIAGNOSIS — S76012A Strain of muscle, fascia and tendon of left hip, initial encounter: Secondary | ICD-10-CM | POA: Diagnosis not present

## 2021-09-02 NOTE — Progress Notes (Signed)
Primary Care / Sports Medicine Office Visit  Patient Information:  Patient ID: Glenn Perez, male DOB: 1945/11/07 Age: 75 y.o. MRN: [id number]   Glenn Perez is a pleasant 75 y.o. male presenting with the following:  Chief Complaint  Patient presents with   New Patient (Initial Visit)   Hip Pain    Left; x1 week (08/26/21); injured while playing tennis; gluteal tear found on [place] 08/27/21 in PCP office per patient; iced injury when it occurred and has been resting taking over the counter ibuprofen as needed, but has not tried any other treatments; taking prednisone 10 mg daily chronically for giant cell arteritis; denies pain in office today    Patient Active Problem List   Diagnosis Date Noted   Strain of gluteus medius of left lower extremity 09/02/2021   Muscle strain of gluteal region 08/27/2021   Temporal arteritis ([place]) 05/16/2021   Elevated PSA 03/29/2021   Benign prostatic hyperplasia without lower urinary tract symptoms 03/29/2021   History of total knee replacement, 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  No results found.   Independent interpretation of notes and tests performed by another provider:   None  Procedures performed:   None  Pertinent History, Exam, Impression, and Recommendations:   Strain of gluteus medius of left lower extremity Patient presenting with acute worsening of left gluteal pain following tennis match on 08/26/2021. Of note, he had been experiencing significantly milder, though new onset, symptoms in the same region the week prior that was atraumatic in onset. Additionally, due to separate medical issue (giant cell temporal  arteritis), he had been advised to refrain from strenuous physical activity (normall in the gym and playing tennis) and had cutback to his baseline regular walking. He was seen in PCP office and was advised of gluteal tear, advised supportive care, PT, and follow-up here.  Examination today reveals no skin discoloration or deformation to region, demonstrates full ROM at the left hip, there is pain with maximal flexion, resisted hip abduction with fully extended left leg elicits maximal pain, hip flexed to 90 degrees with resisted abduction elicits less discomfort, hip flexion and extension benign. He has focal tenderness at the left SI joint, slightly distal in an oblique lateral position overlying the gluteus medius and minimus region, deep gluteal / piriformis, greater trochanter, and hip flexor benign. FABER equivocal, negative FADIR, SLR.  History and findings are most consistent with grade 2 strain of the gluteus medius minimus complex, this is most likely secondary to relative deconditioning to due to necessary activity restrictions coupled with return to high level sports.   I have advised patient on various treatment strategies and given his relatively reassuring findings, desire to return with minimal risk for reinjury, I have advised eccentric focused rehabilitation, modalities, and graded return to activity. He is to return in 8 weeks to assess progress. If suboptimal, ultrasound guided injection, oral medications, and/or advanced imaging to be considered.   Orders & Medications No orders of the defined types were placed in this encounter.  Orders Placed This Encounter  Procedures   Ambulatory referral to  Physical Therapy     Return in about 8 weeks (around 10/28/2021).     Montel Culver, MD   Primary Care Sports Medicine [place]

## 2021-09-02 NOTE — Patient Instructions (Signed)
-   Referral coordinator will contact you in regards to PT scheduling - Continue nonathletic activity as tolerated, walking okay - Once you establish with physical therapy, they will advance your athletics as you improved - Return for follow-up in 8 weeks, contact your office for any issues between now and then

## 2021-09-02 NOTE — Assessment & Plan Note (Signed)
Patient presenting with acute worsening of left gluteal pain following tennis match on 08/26/2021. Of note, he had been experiencing significantly milder, though new onset, symptoms in the same region the week prior that was atraumatic in onset. Additionally, due to separate medical issue (giant cell temporal arteritis), he had been advised to refrain from strenuous physical activity (normall in the gym and playing tennis) and had cutback to his baseline regular walking. He was seen in PCP office and was advised of gluteal tear, advised supportive care, PT, and follow-up here.  Examination today reveals no skin discoloration or deformation to region, demonstrates full ROM at the left hip, there is pain with maximal flexion, resisted hip abduction with fully extended left leg elicits maximal pain, hip flexed to 90 degrees with resisted abduction elicits less discomfort, hip flexion and extension benign. He has focal tenderness at the left SI joint, slightly distal in an oblique lateral position overlying the gluteus medius and minimus region, deep gluteal / piriformis, greater trochanter, and hip flexor benign. FABER equivocal, negative FADIR, SLR.  History and findings are most consistent with grade 2 strain of the gluteus medius minimus complex, this is most likely secondary to relative deconditioning to due to necessary activity restrictions coupled with return to high level sports.   I have advised patient on various treatment strategies and given his relatively reassuring findings, desire to return with minimal risk for reinjury, I have advised eccentric focused rehabilitation, modalities, and graded return to activity. He is to return in 8 weeks to assess progress. If suboptimal, ultrasound guided injection, oral medications, and/or advanced imaging to be considered.

## 2021-09-08 DIAGNOSIS — M316 Other giant cell arteritis: Secondary | ICD-10-CM | POA: Diagnosis not present

## 2021-09-24 DIAGNOSIS — S76012D Strain of muscle, fascia and tendon of left hip, subsequent encounter: Secondary | ICD-10-CM | POA: Diagnosis not present

## 2021-09-24 DIAGNOSIS — M6281 Muscle weakness (generalized): Secondary | ICD-10-CM | POA: Diagnosis not present

## 2021-09-30 DIAGNOSIS — D23112 Other benign neoplasm of skin of right lower eyelid, including canthus: Secondary | ICD-10-CM | POA: Diagnosis not present

## 2021-09-30 DIAGNOSIS — B354 Tinea corporis: Secondary | ICD-10-CM | POA: Diagnosis not present

## 2021-09-30 DIAGNOSIS — L570 Actinic keratosis: Secondary | ICD-10-CM | POA: Diagnosis not present

## 2021-10-06 DIAGNOSIS — S76012D Strain of muscle, fascia and tendon of left hip, subsequent encounter: Secondary | ICD-10-CM | POA: Diagnosis not present

## 2021-10-15 ENCOUNTER — Other Ambulatory Visit: Payer: Self-pay

## 2021-10-27 DIAGNOSIS — H47012 Ischemic optic neuropathy, left eye: Secondary | ICD-10-CM | POA: Diagnosis not present

## 2021-10-28 ENCOUNTER — Ambulatory Visit: Payer: Medicare HMO | Admitting: Family Medicine

## 2021-11-10 DIAGNOSIS — M316 Other giant cell arteritis: Secondary | ICD-10-CM | POA: Diagnosis not present

## 2021-11-12 ENCOUNTER — Emergency Department
Admission: EM | Admit: 2021-11-12 | Discharge: 2021-11-12 | Disposition: A | Discharge status: Home / Self Care | Payer: Medicare HMO | Attending: Emergency Medicine | Admitting: Emergency Medicine

## 2021-11-12 ENCOUNTER — Emergency Department: Payer: Medicare HMO

## 2021-11-12 ENCOUNTER — Other Ambulatory Visit: Payer: Self-pay

## 2021-11-12 DIAGNOSIS — R001 Bradycardia, unspecified: Secondary | ICD-10-CM | POA: Insufficient documentation

## 2021-11-12 DIAGNOSIS — R220 Localized swelling, mass and lump, head: Secondary | ICD-10-CM | POA: Diagnosis not present

## 2021-11-12 DIAGNOSIS — R52 Pain, unspecified: Secondary | ICD-10-CM | POA: Diagnosis not present

## 2021-11-12 DIAGNOSIS — Z96652 Presence of left artificial knee joint: Secondary | ICD-10-CM | POA: Insufficient documentation

## 2021-11-12 DIAGNOSIS — R55 Syncope and collapse: Secondary | ICD-10-CM | POA: Diagnosis not present

## 2021-11-12 DIAGNOSIS — R42 Dizziness and giddiness: Secondary | ICD-10-CM | POA: Diagnosis not present

## 2021-11-12 DIAGNOSIS — Z743 Need for continuous supervision: Secondary | ICD-10-CM | POA: Diagnosis not present

## 2021-11-12 DIAGNOSIS — R519 Headache, unspecified: Secondary | ICD-10-CM | POA: Diagnosis not present

## 2021-11-12 LAB — BASIC METABOLIC PANEL
Anion gap: 9 (ref 5–15)
BUN: 19 mg/dL (ref 8–23)
CO2: 24 mmol/L (ref 22–32)
Calcium: 9.2 mg/dL (ref 8.9–10.3)
Chloride: 103 mmol/L (ref 98–111)
Creatinine, Ser: 1.01 mg/dL (ref 0.61–1.24)
GFR, Estimated: >60 mL/min (ref >60)
Glucose, Bld: 97 mg/dL (ref 70–99)
Potassium: 3.8 mmol/L (ref 3.5–5.1)
Sodium: 136 mmol/L (ref 135–145)

## 2021-11-12 LAB — CBC WITH DIFFERENTIAL/PLATELET
Abs Immature Granulocytes: 0.02 10*3/uL (ref 0.00–0.07)
Basophils Absolute: 0.1 10*3/uL (ref 0.0–0.1)
Basophils Relative: 1 %
Eosinophils Absolute: 0.1 10*3/uL (ref 0.0–0.5)
Eosinophils Relative: 1 %
HCT: 45.3 % (ref 39.0–52.0)
Hemoglobin: 15.1 g/dL (ref 13.0–17.0)
Immature Granulocytes: 0 %
Lymphocytes Relative: 17 %
Lymphs Abs: 1.3 10*3/uL (ref 0.7–4.0)
MCH: 29.3 pg (ref 26.0–34.0)
MCHC: 33.3 g/dL (ref 30.0–36.0)
MCV: 88 fL (ref 80.0–100.0)
Monocytes Absolute: 0.6 10*3/uL (ref 0.1–1.0)
Monocytes Relative: 8 %
Neutro Abs: 5.5 10*3/uL (ref 1.7–7.7)
Neutrophils Relative %: 73 %
Platelets: 170 10*3/uL (ref 150–400)
RBC: 5.15 MIL/uL (ref 4.22–5.81)
RDW: 13 % (ref 11.5–15.5)
WBC: 7.4 10*3/uL (ref 4.0–10.5)
nRBC: 0 % (ref 0.0–0.2)

## 2021-11-12 NOTE — ED Provider Notes (Signed)
[facility] Provider Note    Event Date/Time   First MD Initiated Contact with Patient 11/12/21 1424     (approximate)   History   Loss of Consciousness (Pt in line with wife at [employer] when he got dizzy and had syncopal episode. Pt's wife assisted to floor but pt did hit back of head on R side. Knot felt, no laceration noted. Pt reports pain to the area and denies blood thinner use. A&O x 4. Pt is bradycardic at 57 but states this is normal for him. Pt reports feeling weak still but denies dizziness. States he thinks he is dehydrated. )   HPI  Glenn Perez is a 76 y.o. male past medical history of giant cell arteritis prior syncopal episodes who presents after syncopal episode.  Patient walked for an hour this morning which is typical for him and [place] [facility] [employer] [facility] [place] about an hour and a half.  He left his water bottle at home so he did not hydrate.  He was then at [facility] checking out the gross When he started to feel lightheaded and dizzy.  He subsequently had a syncopal episode.  His wife helped him to the ground but about foot above the ground he did drop and hit his head.  Patient feels improved now.  He denies any palpitations shortness of breath or chest pain.  Has no nausea vomiting abdominal pain or diarrhea.  Does feel thirsty.  No fever.  Thinners.    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      Excl. in [place]?     Most recent vital signs: Vitals:   11/12/21 1424  BP: (!) 157/78  Pulse: (!) 55  Resp: 16  Temp: 97.8 F (36.6 C)  SpO2: 98%     General: Awake, no distress.  CV:  Good peripheral perfusion.  Resp:  Normal effort.  Abd:  No distention.  Neuro:             Awake, Alert, Oriented x 3  Other:  Small area of swelling over the right posterior occiput   ED Results / Procedures / Treatments  Labs (all labs ordered are listed, but only  abnormal results are displayed) Labs Reviewed  CBC WITH DIFFERENTIAL/PLATELET  BASIC METABOLIC PANEL     EKG     RADIOLOGY    PROCEDURES:  Critical Care performed: No  Procedures  The patient is on the cardiac monitor to evaluate for evidence of arrhythmia and/or significant heart rate changes.   MEDICATIONS ORDERED IN ED: Medications - No data to display   IMPRESSION / MDM / [place] / ED COURSE  I reviewed the triage vital signs and the nursing notes.                              Differential diagnosis includes, but is not limited to, dehydration, vasovagal syncope, orthostatic syncope, less likely cardiogenic  Patient is a 76 year old male who presents after syncopal episode.  Patient is a quite active on a daily basis and today did his normal workout which includes an hour of walking and then an hour and a half work out at [employer].  He did not have his normal water with him and thinks that he was dehydrated.  He was in the grocery store standing in line when he subsequently had a prodrome of lightheadedness and dizziness and  had a witnessed syncopal episode.  He had no preceding chest pain shortness of breath palpitations.  Patient feels thirsty but otherwise back to baseline.  Vital signs are notable for bradycardia but patient says this is normal for him and patient is quite in shape so I believe this.  Does have some posterior occipital swelling.  I suspect that this was orthostatic in the setting of being dehydrated.  We will obtain a CBC and BMP EKG and CT head and C-spine.  If all within normal limits anticipate discharge.  He is pending all of his work-up at the time of signout.      FINAL CLINICAL IMPRESSION(S) / ED DIAGNOSES   Final diagnoses:  Syncope, unspecified syncope type     Rx / DC Orders   ED Discharge Orders     None        Note:  This document was prepared using Dragon voice recognition software and may include unintentional dictation errors.   Rada Hay, MD 11/12/21 [phone]

## 2021-11-12 NOTE — ED Provider Notes (Signed)
----------------------------------------- °  4:41 PM on 11/12/2021 -----------------------------------------   I took over care of this patient from Dr. Starleen Blue.  I independently viewed and interpreted the CT head and cervical spine, which are negative for ICH, cervical spine fracture, or other acute findings.  BMP and CBC are both normal.  EKG is nonischemic.  On reassessment, the patient states that he feels much better.  He feels ready to go home.  I counseled him on the results of the work-up.  Return precautions given, and he expresses understanding.  ED ECG REPORT I, Arta Silence, the attending physician, personally viewed and interpreted this ECG.  Date: 11/12/2021 EKG Time: 1614 Rate: 52 Rhythm: normal sinus rhythm QRS Axis: normal Intervals: normal ST/T Wave abnormalities: Nonspecific T wave flattening anteriorly Narrative Interpretation: no evidence of acute ischemia; no significant change when compared to EKG of 06/17/2021    Arta Silence, MD 11/12/21 1643

## 2021-11-20 DIAGNOSIS — M316 Other giant cell arteritis: Secondary | ICD-10-CM | POA: Diagnosis not present

## 2021-11-20 DIAGNOSIS — R55 Syncope and collapse: Secondary | ICD-10-CM | POA: Diagnosis not present

## 2021-11-26 ENCOUNTER — Other Ambulatory Visit: Payer: Self-pay

## 2021-11-26 ENCOUNTER — Encounter: Payer: Self-pay | Admitting: Family Medicine

## 2021-11-26 ENCOUNTER — Ambulatory Visit (INDEPENDENT_AMBULATORY_CARE_PROVIDER_SITE_OTHER): Payer: Medicare HMO | Admitting: Family Medicine

## 2021-11-26 VITALS — BP 109/63 | HR 59 | Temp 98.3°F | Resp 16 | Ht 72.0 in | Wt 176.0 lb

## 2021-11-26 DIAGNOSIS — R55 Syncope and collapse: Secondary | ICD-10-CM

## 2021-11-26 DIAGNOSIS — F320 Major depressive disorder, single episode, mild: Secondary | ICD-10-CM | POA: Diagnosis not present

## 2021-11-26 DIAGNOSIS — Z1389 Encounter for screening for other disorder: Secondary | ICD-10-CM

## 2021-11-26 DIAGNOSIS — R69 Illness, unspecified: Secondary | ICD-10-CM | POA: Diagnosis not present

## 2021-11-26 DIAGNOSIS — M316 Other giant cell arteritis: Secondary | ICD-10-CM | POA: Diagnosis not present

## 2021-11-26 DIAGNOSIS — R972 Elevated prostate specific antigen [PSA]: Secondary | ICD-10-CM

## 2021-11-26 LAB — POCT URINALYSIS DIPSTICK
Bilirubin, UA: NEGATIVE
Blood, UA: NEGATIVE
Glucose, UA: NEGATIVE
Ketones, UA: NEGATIVE
Leukocytes, UA: NEGATIVE
Nitrite, UA: NEGATIVE
Protein, UA: NEGATIVE
Spec Grav, UA: 1.01 (ref 1.010–1.025)
Urobilinogen, UA: 0.2 E.U./dL
pH, UA: 6 (ref 5.0–8.0)

## 2021-11-26 NOTE — Progress Notes (Signed)
?  ? ? ?Established patient visit ? ?I,April Miller,acting as a scribe for Wilhemena Durie, MD.,have documented all relevant documentation on the behalf of Wilhemena Durie, MD,as directed by  Wilhemena Durie, MD while in the presence of Wilhemena Durie, MD. ? ? ?Patient: Glenn Perez   DOB: 08/03/1946   76 y.o. Male  MRN: [id number] ?Visit Date: 11/26/2021 ? ?Today's healthcare provider: Wilhemena Durie, MD  ? ?Chief Complaint  ?Patient presents with  ? Follow-up  ? Depression  ? ?Subjective  ?  ?HPI  ?Patient comes in today for follow-up.  He overall is improving but continues have a lot of questions about his chronic medical issues.  We had a long talk about his elevated PSA and 1 out of 16 positive prostate biopsies ?Depression, Follow-up ? ?He  was last seen for this 4 months ago. ?Changes made at last visit include; Took a total of 2 doses of Remeron.  He says it did not help him.  Strongly recommend counseling. ?  ?He reports good compliance with treatment. ?He is not having side effects. none ? ?He reports good tolerance of treatment. ?Current symptoms include: hopelessness ?He feels he is Unchanged since last visit. ? ?Depression screen [facility] 2/9 11/26/2021 09/02/2021 06/25/2021  ?Decreased Interest 0 0 2  ?Down, Depressed, Hopeless 1 0 2  ?PHQ - 2 Score 1 0 4  ?Altered sleeping 0 0 1  ?Tired, decreased energy 0 0 3  ?Change in appetite 0 0 0  ?Feeling bad or failure about yourself  0 0 0  ?Trouble concentrating 0 0 2  ?Moving slowly or fidgety/restless 0 0 2  ?Suicidal thoughts 0 0 0  ?PHQ-9 Score 1 0 12  ?Difficult doing work/chores Not difficult at all Not difficult at all Somewhat difficult  ?  ?----------------------------------------------------------------------------------------- ? ? ?Medications: ?Outpatient Medications Prior to Visit  ?Medication Sig  ? ACTEMRA ACTPEN 162 MG/0.9ML SOAJ Inject 1 each into the skin every 7 (seven) days.  ? fluticasone (FLONASE) 50 MCG/ACT nasal spray  Place 1 spray into the nose as needed.  ? Multiple Vitamin (MULTI-VITAMIN) tablet Take 1 tablet by mouth daily.  ? predniSONE (DELTASONE) 20 MG tablet Take 10 mg by mouth daily with breakfast.  ? [DISCONTINUED] ketoconazole (NIZORAL) 2 % cream Apply 1 application topically daily. (Patient not taking: Reported on 11/26/2021)  ? [DISCONTINUED] predniSONE (DELTASONE) 5 MG tablet Take 15 mg by mouth daily. (Patient not taking: Reported on 11/26/2021)  ? ?No facility-administered medications prior to visit.  ? ? ?Review of Systems  ?Constitutional:  Negative for appetite change, chills and fever.  ?Respiratory:  Negative for chest tightness, shortness of breath and wheezing.   ?Cardiovascular:  Negative for chest pain and palpitations.  ?Gastrointestinal:  Negative for abdominal pain, nausea and vomiting.  ? ?  ?  Objective  ?  ?BP 109/63 (BP Location: Right Arm, Patient Position: Sitting, Cuff Size: Large)   Pulse (!) 59   Temp 98.3 ?F (36.8 ?C) (Temporal)   Resp 16   Ht 6' (1.829 m)   Wt 176 lb (79.8 kg)   SpO2 97%   BMI 23.87 kg/m?  ?BP Readings from Last 3 Encounters:  ?11/26/21 109/63  ?11/12/21 (!) 157/78  ?09/02/21 124/68  ? ?Wt Readings from Last 3 Encounters:  ?11/26/21 176 lb (79.8 kg)  ?11/12/21 175 lb 0.7 oz (79.4 kg)  ?09/02/21 175 lb (79.4 kg)  ? ?  ? ?Physical Exam ?Vitals reviewed.  ?Constitutional:   ?  Appearance: Normal appearance. He is normal weight.  ?HENT:  ?   Head: Normocephalic and atraumatic.  ?   Right Ear: Tympanic membrane, ear canal and external ear normal.  ?   Left Ear: Tympanic membrane, ear canal and external ear normal.  ?   Nose: Nose normal.  ?   Mouth/Throat:  ?   Mouth: Mucous membranes are moist.  ?   Pharynx: Oropharynx is clear.  ?Eyes:  ?   Extraocular Movements: Extraocular movements intact.  ?   Conjunctiva/sclera: Conjunctivae normal.  ?   Pupils: Pupils are equal, round, and reactive to light.  ?Cardiovascular:  ?   Rate and Rhythm: Normal rate and regular rhythm.  ?    Pulses: Normal pulses.  ?   Heart sounds: Normal heart sounds.  ?Pulmonary:  ?   Effort: Pulmonary effort is normal.  ?   Breath sounds: Normal breath sounds.  ?Abdominal:  ?   General: Abdomen is flat. Bowel sounds are normal.  ?   Palpations: Abdomen is soft.  ?Musculoskeletal:  ?   Cervical back: Normal range of motion and neck supple.  ?Skin: ?   General: Skin is warm and dry.  ?Neurological:  ?   General: No focal deficit present.  ?   Mental Status: He is alert and oriented to person, place, and time. Mental status is at baseline.  ?Psychiatric:     ?   Mood and Affect: Mood normal.     ?   Behavior: Behavior normal.     ?   Thought Content: Thought content normal.     ?   Judgment: Judgment normal.  ?  ? ? ?Results for orders placed or performed in visit on 11/26/21  ?POCT urinalysis dipstick  ?Result Value Ref Range  ? Color, UA [place]   ? Clarity, UA Clear   ? Glucose, UA Negative Negative  ? Bilirubin, UA Negative   ? Ketones, UA Negative   ? Spec Grav, UA 1.010 1.010 - 1.025  ? Blood, UA Negative   ? pH, UA 6.0 5.0 - 8.0  ? Protein, UA Negative Negative  ? Urobilinogen, UA 0.2 0.2 or 1.0 E.U./dL  ? Nitrite, UA Negative   ? Leukocytes, UA Negative Negative  ? ? Assessment & Plan  ?  ? ?1. Depression, major, single episode, mild ([place]) ?Definitely improved.  I still recommend counseling. ? ?2. Screening for blood or protein in urine ? ?- POCT urinalysis dipstick ? ?3. Elevated PSA ?Patient with 1 out of 16 biopsy showing prostate cancer.  His wife wishes him to seek out a second opinion. ? ?4. Temporal arteritis ([place]) ?Followed by rheumatology.  He is feeling better.  He still has decreased right visual field defect ? ?5. Syncope, unspecified syncope type ?Recent syncope that he thinks is secondary to hypotension.  I stressed to him he needs to follow-up with cardiology about this.More than 50% 25 minute visit spent in counseling or coordination of care ? ? ? ?Return in about 7 months (around 06/28/2022).  ?    ? ?I, Wilhemena Durie, MD, have reviewed all documentation for this visit. The documentation on 11/30/21 for the exam, diagnosis, procedures, and orders are all accurate and complete. ? ? ? ?Dorse Locy Cranford Mon, MD  ?[facility] ?[phone] (phone) ?[phone] (fax) ? ?[facility] ?

## 2021-12-09 DIAGNOSIS — M353 Polymyalgia rheumatica: Secondary | ICD-10-CM | POA: Diagnosis not present

## 2021-12-09 DIAGNOSIS — M1A00X0 Idiopathic chronic gout, unspecified site, without tophus (tophi): Secondary | ICD-10-CM | POA: Diagnosis not present

## 2021-12-09 DIAGNOSIS — R55 Syncope and collapse: Secondary | ICD-10-CM | POA: Diagnosis not present

## 2021-12-09 DIAGNOSIS — E860 Dehydration: Secondary | ICD-10-CM | POA: Diagnosis not present

## 2021-12-09 DIAGNOSIS — I480 Paroxysmal atrial fibrillation: Secondary | ICD-10-CM | POA: Diagnosis not present

## 2021-12-09 DIAGNOSIS — R42 Dizziness and giddiness: Secondary | ICD-10-CM | POA: Diagnosis not present

## 2021-12-11 ENCOUNTER — Telehealth: Payer: Self-pay | Admitting: Urology

## 2021-12-11 NOTE — Telephone Encounter (Signed)
He is not do for labs until scheduled .  ?

## 2021-12-11 NOTE — Telephone Encounter (Signed)
Pt doesn't have an appt for labs until 4/28 and w/Stoioff on 5/1.  He is having frequent urination, he says he's peeing like it's his job!  I scheduled appt w/Stoioff on Thurs 4/13 for pt to come in sooner, but he wants to know if he will need any labs or need to be seen sooner than this?  He was recently diagnosed w/prostate cancer. ?

## 2021-12-19 DIAGNOSIS — R55 Syncope and collapse: Secondary | ICD-10-CM | POA: Diagnosis not present

## 2022-01-01 ENCOUNTER — Encounter: Payer: Self-pay | Admitting: Urology

## 2022-01-01 ENCOUNTER — Ambulatory Visit: Payer: Medicare HMO | Admitting: Urology

## 2022-01-01 VITALS — BP 115/69 | HR 52 | Ht 72.0 in | Wt 173.0 lb

## 2022-01-01 DIAGNOSIS — C61 Malignant neoplasm of prostate: Secondary | ICD-10-CM | POA: Diagnosis not present

## 2022-01-01 DIAGNOSIS — N400 Enlarged prostate without lower urinary tract symptoms: Secondary | ICD-10-CM

## 2022-01-01 DIAGNOSIS — R350 Frequency of micturition: Secondary | ICD-10-CM

## 2022-01-01 LAB — MICROSCOPIC EXAMINATION
Bacteria, UA: NONE SEEN
Epithelial Cells (non renal): NONE SEEN /hpf (ref 0–10)

## 2022-01-01 LAB — URINALYSIS, COMPLETE
Bilirubin, UA: NEGATIVE
Glucose, UA: NEGATIVE
Ketones, UA: NEGATIVE
Leukocytes,UA: NEGATIVE
Nitrite, UA: NEGATIVE
Protein,UA: NEGATIVE
RBC, UA: NEGATIVE
Specific Gravity, UA: 1.015 (ref 1.005–1.030)
Urobilinogen, Ur: 0.2 mg/dL (ref 0.2–1.0)
pH, UA: 6 (ref 5.0–7.5)

## 2022-01-01 LAB — BLADDER SCAN AMB NON-IMAGING: Scan Result: 50

## 2022-01-01 NOTE — Progress Notes (Signed)
? ?01/01/2022 ?11:54 AM  ? ?Glenn Perez ?December 10, 1945 ?[id number] ? ?Referring provider: Jerrol Banana., MD ?[place] ?[address] ?[place],  [place] [address] ? ?Chief Complaint  ?Patient presents with  ? Urinary Frequency  ? ? ?Urologic history: ?1.  Very low risk prostate cancer ?PSA 02/2021 5.1; 5.4 on repeat 04/2021 ?MRI 04/2021; PI-RADS 4 lesion mid-apical prostate anterior gland ?Volume 29 cc ?MR fusion biopsy 06/2021: 29 g prostate; ROI BX x4 negative ?1/12 template cores Gleason 3+3 (30%) right apex ?Elected active surveillance ? ?HPI: ?76 y.o. male presents for 6 month follow-up. ? ?Noted January 2023 to have syncope.  Increased his fluid intake and was complaining of increased frequency.  Symptoms have improved and are presently not bothersome ?IPSS 5/35 with a bother rated 1/6 ?No dysuria or gross hematuria ?No flank, abdominal or pelvic pain ? ? ?PMH: ?Past Medical History:  ?Diagnosis Date  ? 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 Syncope   ? ? ?Surgical History: ?Past Surgical History:  ?Procedure Laterality Date  ? APPENDECTOMY    ? ARTERY BIOPSY Left 05/20/2021  ? Procedure: BIOPSY TEMPORAL ARTERY;  Surgeon: Katha Cabal, MD;  Location: [facility] ORS;  Service: Vascular;  Laterality: Left;  ? KNEE SURGERY    ? NOSE SURGERY    ? x 2.  ? REPLACEMENT TOTAL KNEE    ? SHOULDER SURGERY  10/2015  ? for partially torn rotator cuff and detached bicep tendon.  ? ? ?Home Medications:  ?Allergies as of 01/01/2022   ?No Known Allergies ?  ? ?  ?Medication List  ?  ? ?  ? Accurate as of January 01, 2022 11:54 AM. If you have any questions, ask your nurse or doctor.  ?  ?  ? ?  ? ?Actemra ACTPen 162 MG/0.9ML Soaj ?Generic drug: Tocilizumab ?Inject 1 each into the skin every 7 (seven) days. ?  ?fluticasone 50 MCG/ACT nasal spray ?Commonly known as: FLONASE ?Place 1 spray into the nose as needed. ?  ?Multi-Vitamin tablet ?Take 1 tablet by mouth daily. ?  ?predniSONE 20 MG tablet ?Commonly known as: DELTASONE ?Take  10 mg by mouth daily with breakfast. ?  ? ?  ? ? ?Allergies: No Known Allergies ? ?Family History: ?Family History  ?Problem Relation Age of Onset  ? Diverticulosis Mother   ? Cancer Brother   ?     unsure of what kind  ? ? ?Social History:  reports that he has never smoked. He has never used smokeless tobacco. He reports current alcohol use of about 1.0 standard drink per week. He reports that he does not use drugs. ? ? ?Physical Exam: ?BP 115/69   Pulse (!) 52   Ht 6' (1.829 m)   Wt 173 lb (78.5 kg)   BMI 23.46 kg/m?   ?Constitutional:  Alert and oriented, No acute distress. ?Respiratory: Normal respiratory effort, no increased work of breathing. ?Psychiatric: Normal mood and affect. ? ?Laboratory Data: ? ?Urinalysis ?Dipstick/microscopy negative ? ? ?Assessment & Plan:   ? ?1.  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  Happy to refer for a second opinion if he desires ? ?2.  Urinary frequency ?Not bothersome ?  PVR 50 mL ? ? ?Abbie Sons, MD ? ?[facility] ?[address] ?[place], [place] [address] ?[phone] ? ?

## 2022-01-02 LAB — PSA: Prostate Specific Ag, Serum: 3.8 ng/mL (ref 0.0–4.0)

## 2022-01-05 ENCOUNTER — Encounter: Payer: Self-pay | Admitting: *Deleted

## 2022-01-13 DIAGNOSIS — R55 Syncope and collapse: Secondary | ICD-10-CM | POA: Diagnosis not present

## 2022-01-16 ENCOUNTER — Other Ambulatory Visit: Payer: Medicare HMO

## 2022-01-19 ENCOUNTER — Ambulatory Visit: Payer: Medicare HMO | Admitting: Urology

## 2022-01-21 DIAGNOSIS — I480 Paroxysmal atrial fibrillation: Secondary | ICD-10-CM | POA: Diagnosis not present

## 2022-01-21 DIAGNOSIS — R55 Syncope and collapse: Secondary | ICD-10-CM | POA: Diagnosis not present

## 2022-01-21 DIAGNOSIS — R42 Dizziness and giddiness: Secondary | ICD-10-CM | POA: Diagnosis not present

## 2022-01-21 DIAGNOSIS — M353 Polymyalgia rheumatica: Secondary | ICD-10-CM | POA: Diagnosis not present

## 2022-02-09 DIAGNOSIS — M316 Other giant cell arteritis: Secondary | ICD-10-CM | POA: Diagnosis not present

## 2022-03-02 DIAGNOSIS — Z79899 Other long term (current) drug therapy: Secondary | ICD-10-CM | POA: Diagnosis not present

## 2022-03-02 DIAGNOSIS — M316 Other giant cell arteritis: Secondary | ICD-10-CM | POA: Diagnosis not present

## 2022-03-02 DIAGNOSIS — H47012 Ischemic optic neuropathy, left eye: Secondary | ICD-10-CM | POA: Diagnosis not present

## 2022-04-04 IMAGING — CT CT HEAD W/O CM
4 series · 16 of 47 positions shown, 18 images · non-contrast
Comparison: Brain MRI 07/11/2021.

CLINICAL DATA: Patient with loss of consciousness. Syncopal
episode.



[Series 2: head bone · axial · 0.42mm/px · z∈[-105,-75]mm · 3 of 77 slices shown]
[im 8/77  bone]
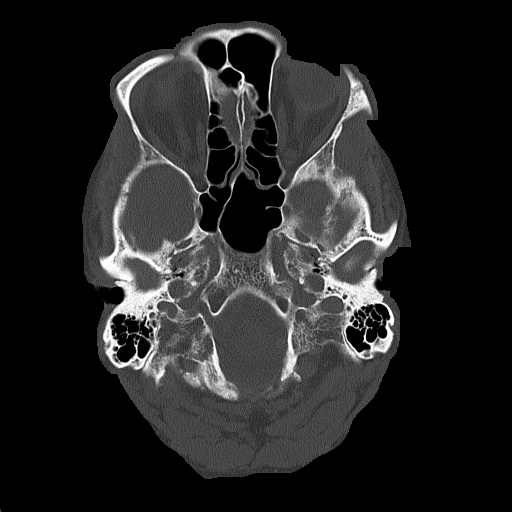
[im 16/77  bone]
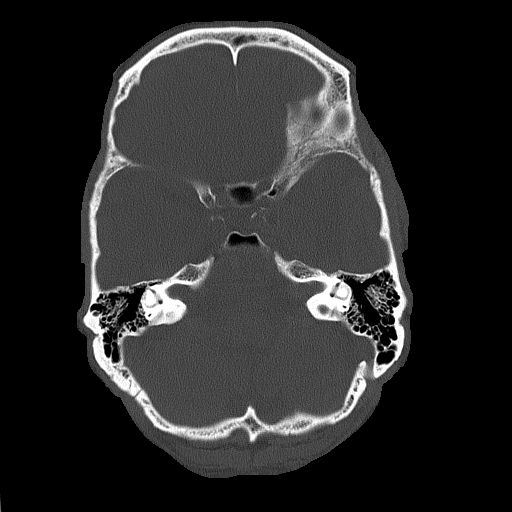
[im 23/77  bone]
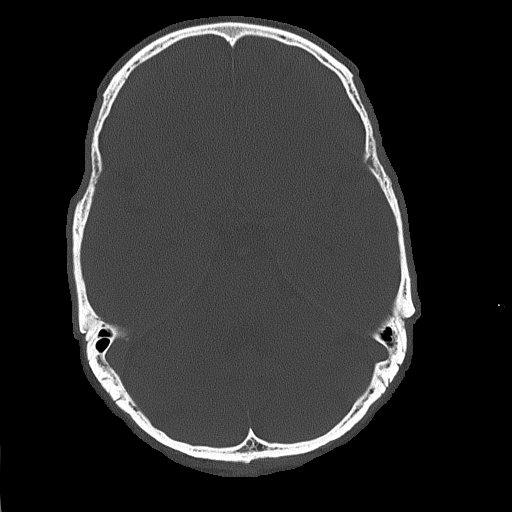

[Series 3: head wo · axial · 0.42mm/px · z∈[-104,+11]mm · 7 of 31 slices shown, 9 images]
[im 4/31  brain]
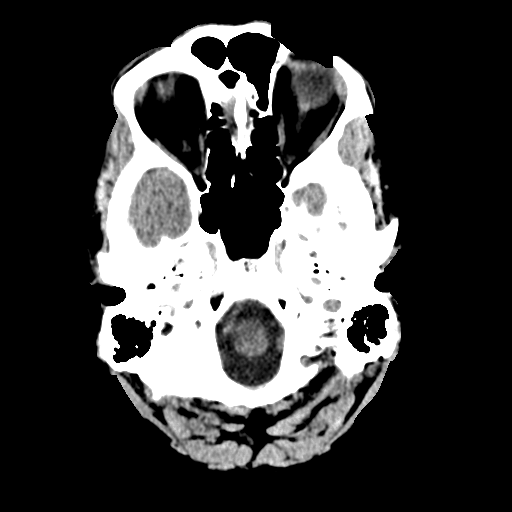
[im 4/31  bone]
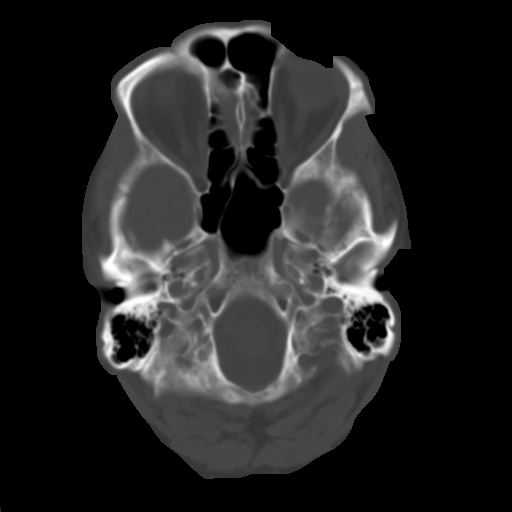
[im 8/31  brain]
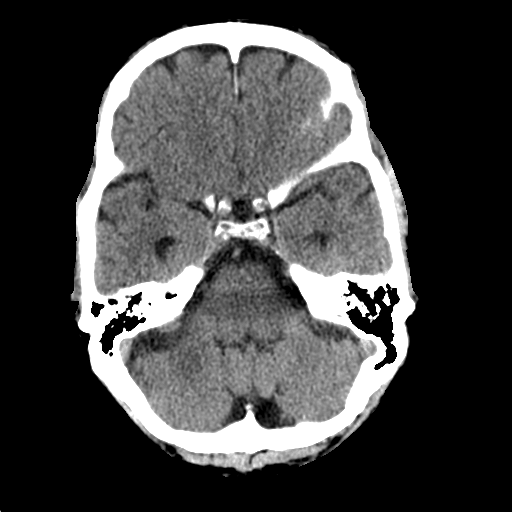
[im 12/31  brain]
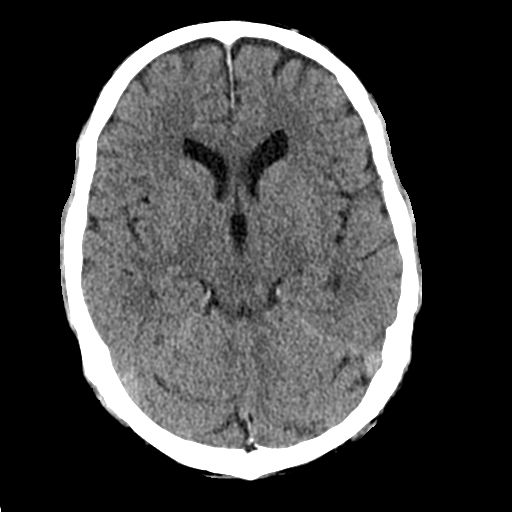
[im 16/31  brain]
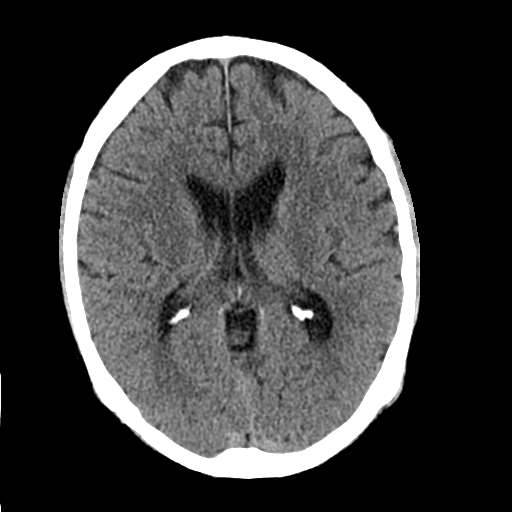
[im 19/31  brain]
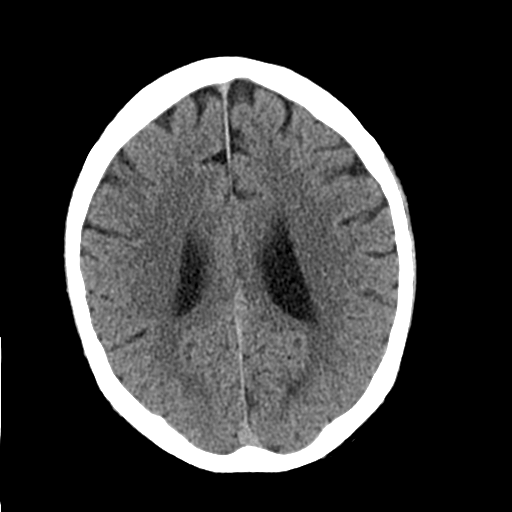
[im 19/31  bone]
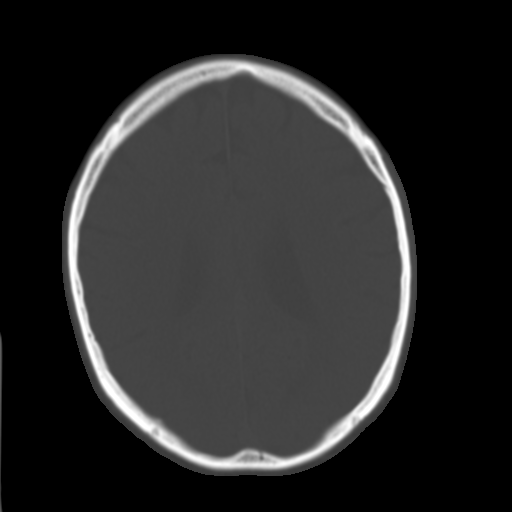
[im 23/31  brain]
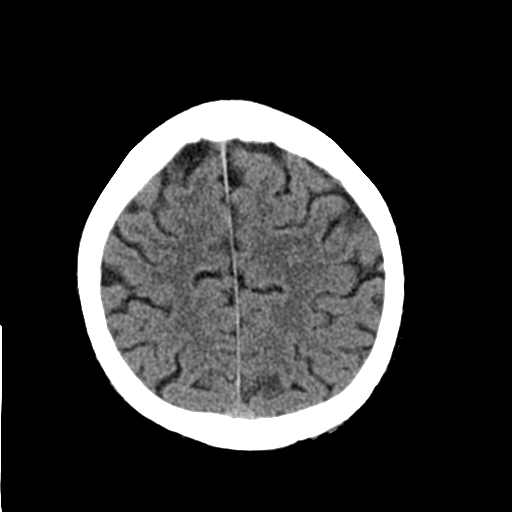
[im 27/31  brain]
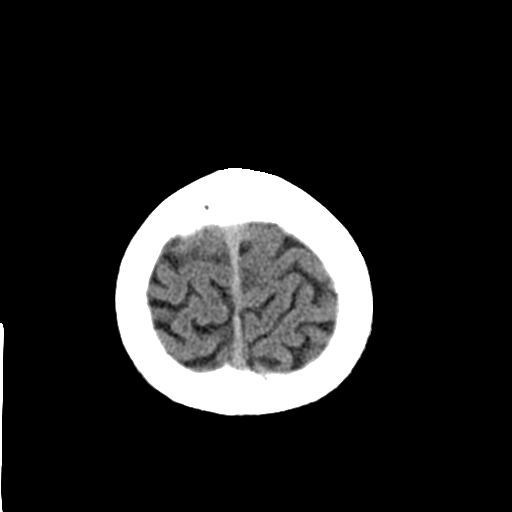

[Series 4: coronal soft tissue · coronal · 0.32mm/px · 3 of 68 slices shown]
[im 23/68  brain]
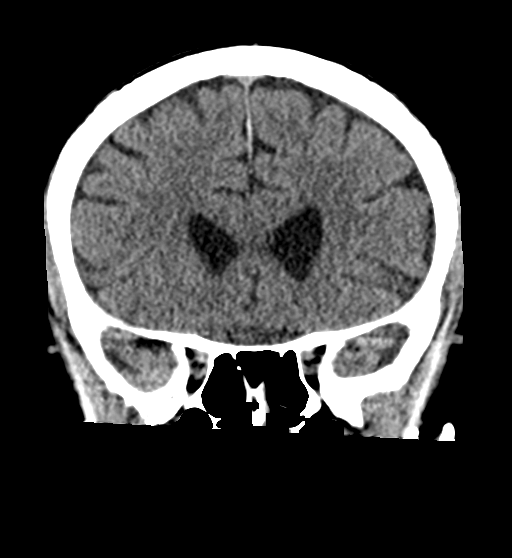
[im 30/68  brain]
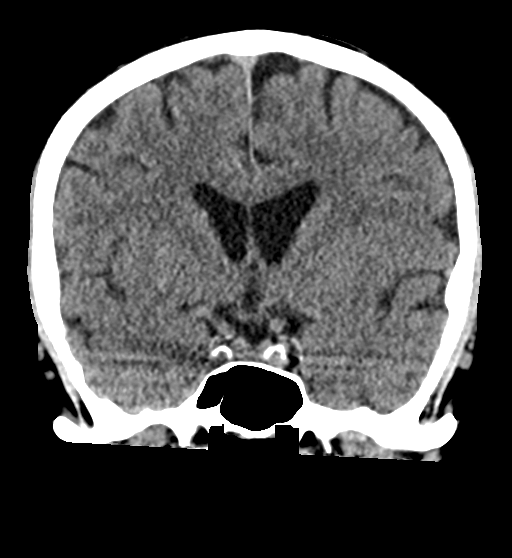
[im 38/68  brain]
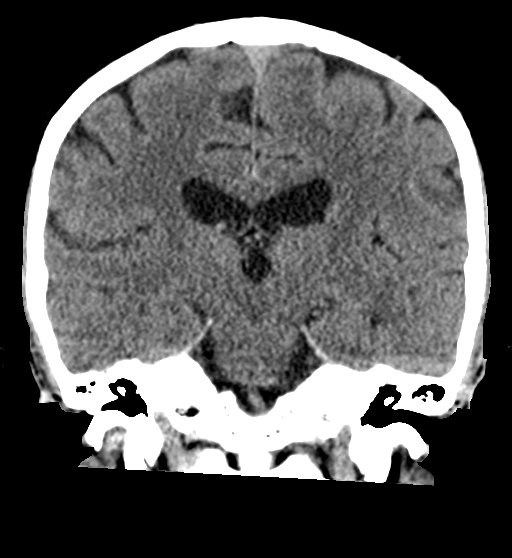

[Series 5: sagittal soft tissue · sagittal · 0.35mm/px · 3 of 56 slices shown]
[im 19/56  brain]
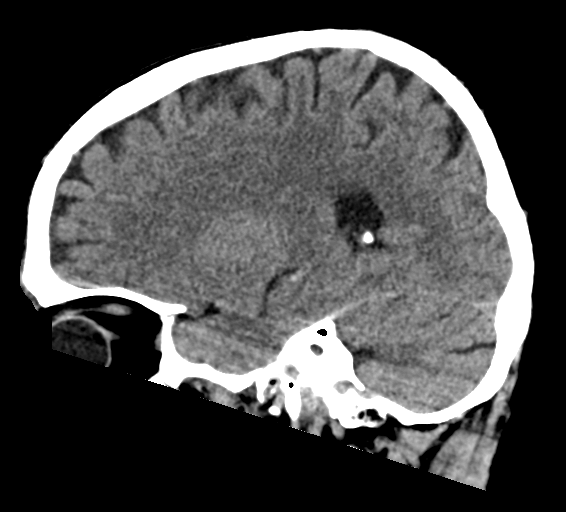
[im 28/56  brain]
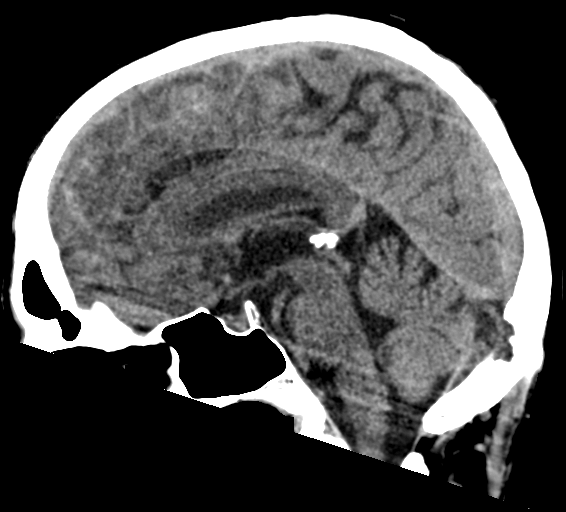
[im 37/56  brain]
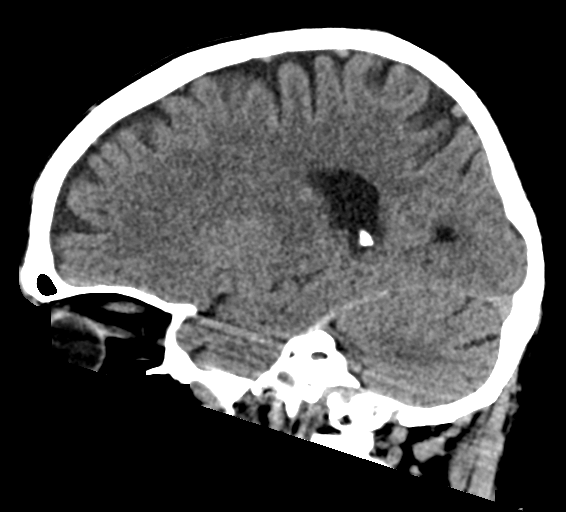

[16 of 47 positions shown; findings below may reference images not displayed]

FINDINGS: CT HEAD FINDINGS

Brain: Ventricles and sulci are appropriate for patient's age. No
evidence for acute cortically based infarct, intracranial
hemorrhage, mass lesion mass effect.

Vascular: Portable

Skull: Intact.

Sinuses/Orbits: Paranasal sinuses are well aerated. Mastoid air
cells are unremarkable. Orbits are unremarkable.

Other: None.

CT CERVICAL SPINE FINDINGS

Alignment: Normal.

Skull base and vertebrae: No acute fracture. Degenerative disc
disease most pronounced C4-5 and C5-6.

Soft tissues and spinal canal: No prevertebral fluid or swelling. No
visible canal hematoma.

Upper chest: Unremarkable.

Other: None
IMPRESSION: No acute intracranial process.

No acute cervical spine fracture. Degenerative changes.

## 2022-04-04 IMAGING — CT CT CERVICAL SPINE W/O CM
3 of 4 series · 13 of 33 positions shown, 16 images · non-contrast
Comparison: Brain MRI 07/11/2021.

CLINICAL DATA: Patient with loss of consciousness. Syncopal
episode.



[Series 6: sagittal bone · sagittal · 0.33mm/px · 5 of 54 slices shown, 6 images]
[im 18/54  bone]
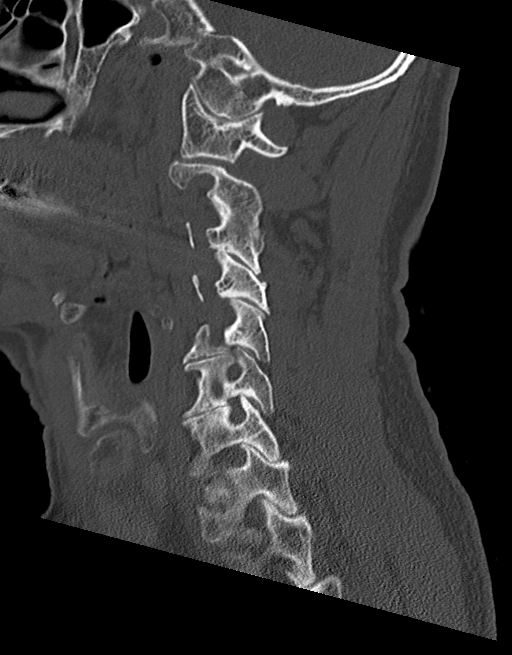
[im 23/54  bone]
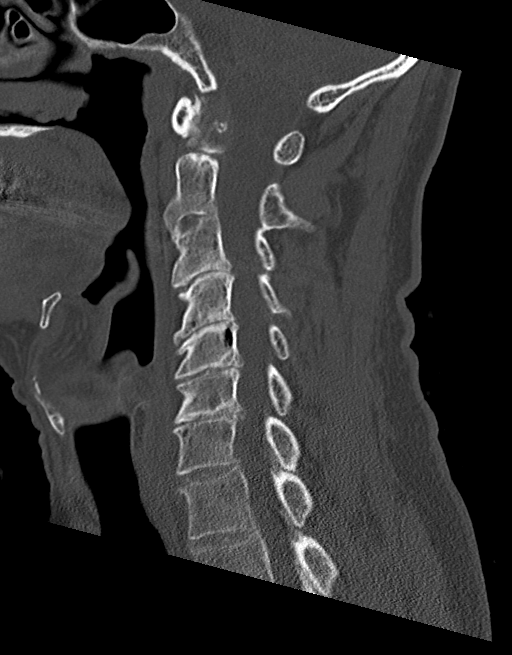
[im 27/54  soft-tissue]
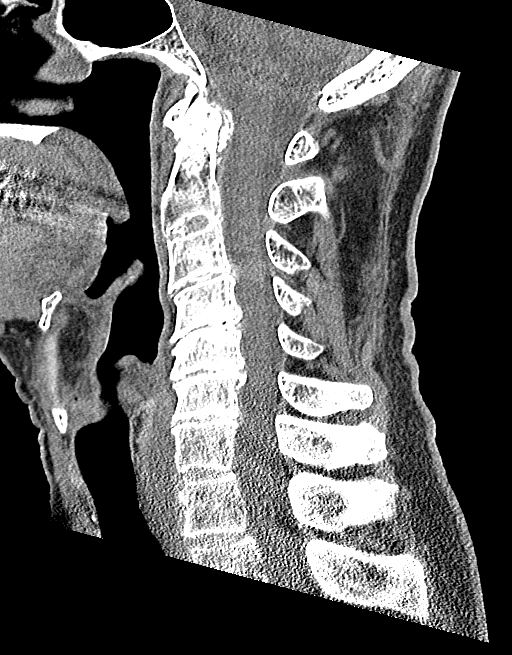
[im 27/54  bone]
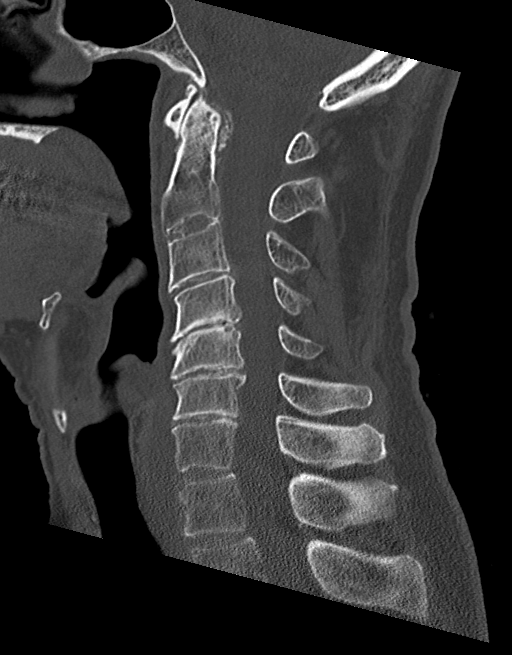
[im 31/54  bone]
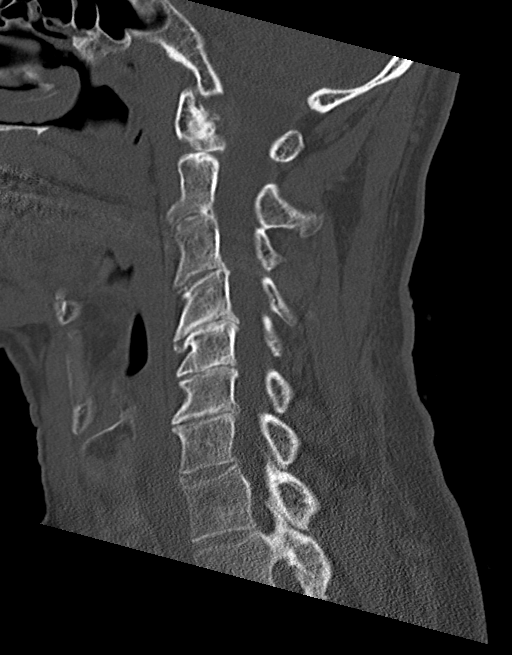
[im 36/54  bone]
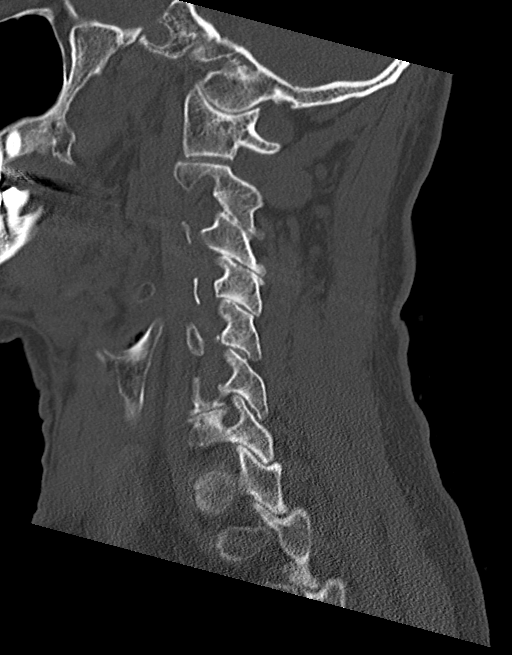

[Series 7: coronal bone · coronal · 0.32mm/px · 3 of 63 slices shown]
[im 13/63  bone]
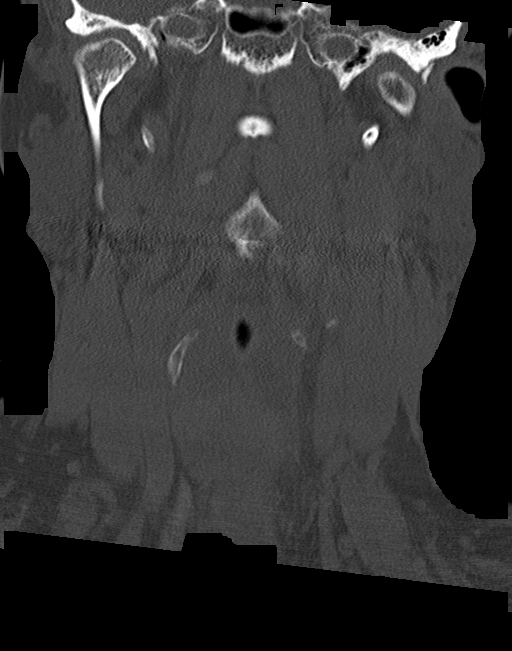
[im 25/63  bone]
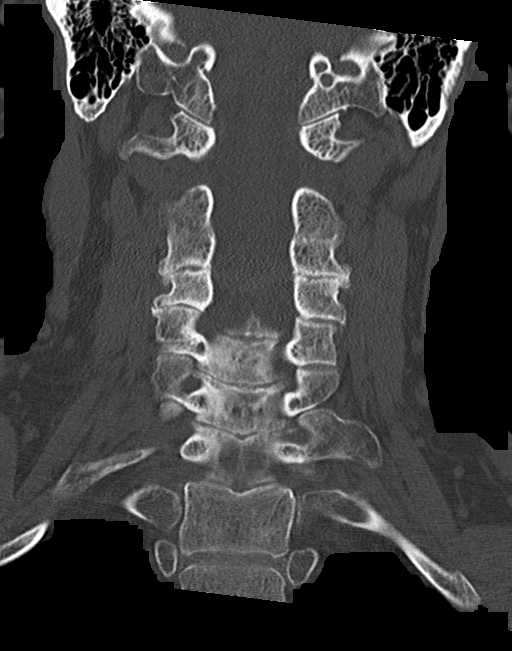
[im 38/63  bone]
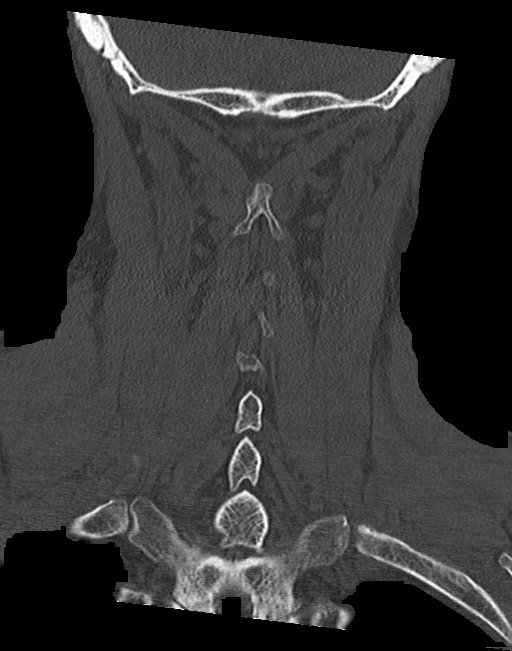

[Series 8: orthogonal bone · axial · 0.32mm/px · z∈[-290,-146]mm · 5 of 104 slices shown, 7 images]
[im 15/104  soft-tissue]
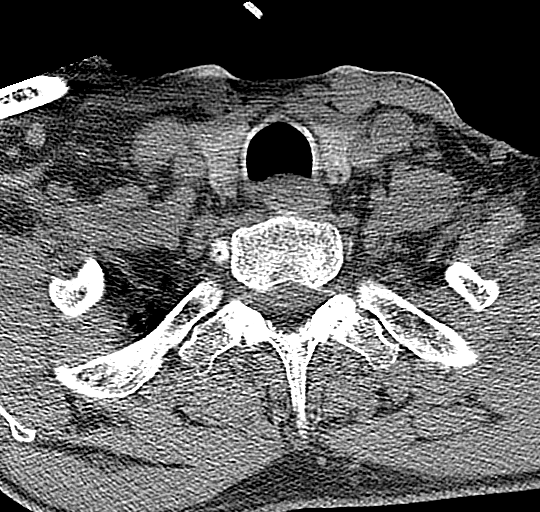
[im 15/104  bone]
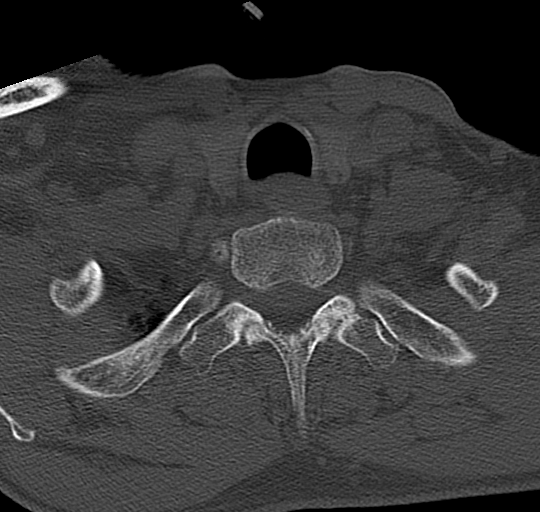
[im 30/104  bone]
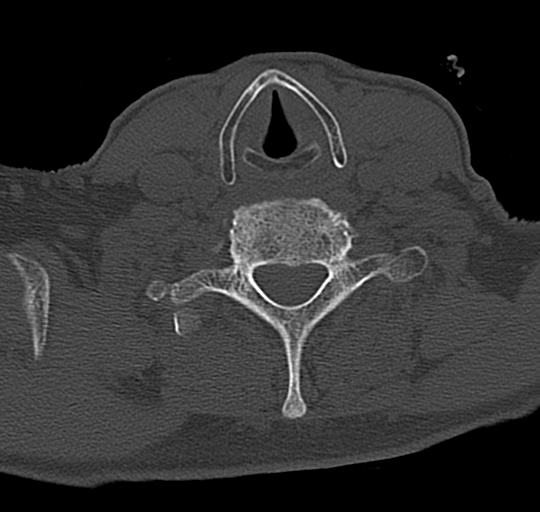
[im 59/104  bone]
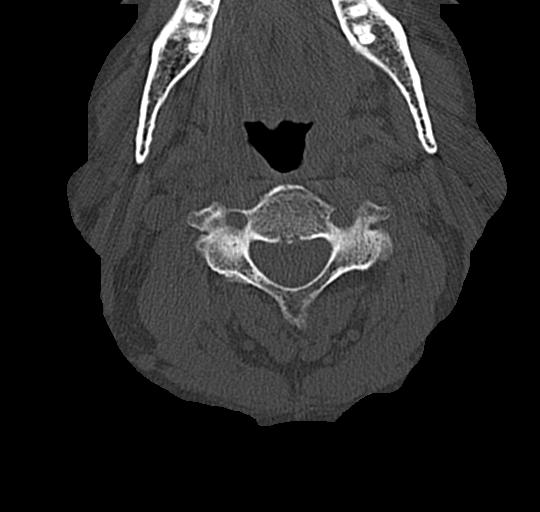
[im 74/104  bone]
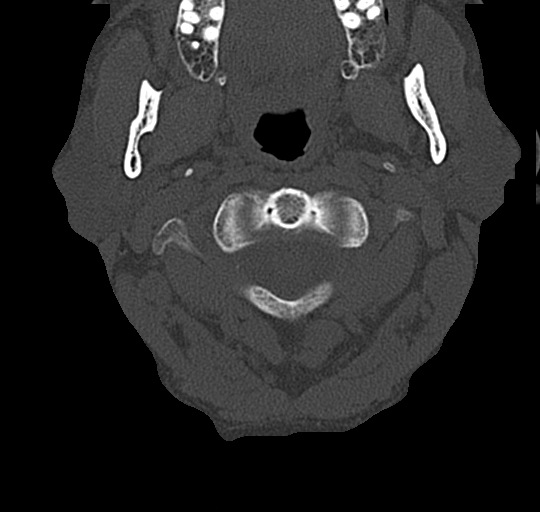
[im 89/104  soft-tissue]
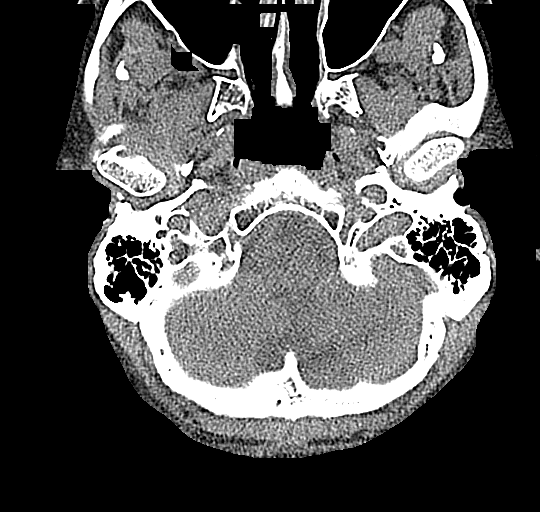
[im 89/104  bone]
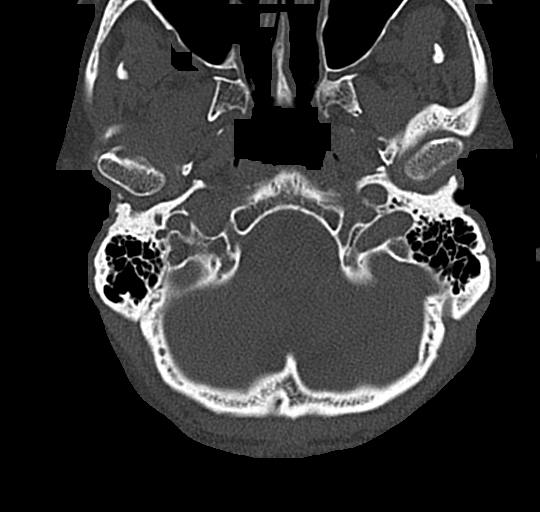

[13 of 33 positions shown; findings below may reference images not displayed]

FINDINGS: CT HEAD FINDINGS

Brain: Ventricles and sulci are appropriate for patient's age. No
evidence for acute cortically based infarct, intracranial
hemorrhage, mass lesion mass effect.

Vascular: Portable

Skull: Intact.

Sinuses/Orbits: Paranasal sinuses are well aerated. Mastoid air
cells are unremarkable. Orbits are unremarkable.

Other: None.

CT CERVICAL SPINE FINDINGS

Alignment: Normal.

Skull base and vertebrae: No acute fracture. Degenerative disc
disease most pronounced C4-5 and C5-6.

Soft tissues and spinal canal: No prevertebral fluid or swelling. No
visible canal hematoma.

Upper chest: Unremarkable.

Other: None
IMPRESSION: No acute intracranial process.

No acute cervical spine fracture. Degenerative changes.

## 2022-05-04 DIAGNOSIS — M316 Other giant cell arteritis: Secondary | ICD-10-CM | POA: Diagnosis not present

## 2022-05-04 DIAGNOSIS — Z79899 Other long term (current) drug therapy: Secondary | ICD-10-CM | POA: Diagnosis not present

## 2022-05-18 DIAGNOSIS — H52223 Regular astigmatism, bilateral: Secondary | ICD-10-CM | POA: Diagnosis not present

## 2022-05-18 DIAGNOSIS — H524 Presbyopia: Secondary | ICD-10-CM | POA: Diagnosis not present

## 2022-05-18 DIAGNOSIS — H5203 Hypermetropia, bilateral: Secondary | ICD-10-CM | POA: Diagnosis not present

## 2022-05-18 DIAGNOSIS — H2512 Age-related nuclear cataract, left eye: Secondary | ICD-10-CM | POA: Diagnosis not present

## 2022-05-18 DIAGNOSIS — Z9841 Cataract extraction status, right eye: Secondary | ICD-10-CM | POA: Diagnosis not present

## 2022-06-21 DIAGNOSIS — R0982 Postnasal drip: Secondary | ICD-10-CM | POA: Diagnosis not present

## 2022-06-21 DIAGNOSIS — J029 Acute pharyngitis, unspecified: Secondary | ICD-10-CM | POA: Diagnosis not present

## 2022-07-02 ENCOUNTER — Other Ambulatory Visit: Payer: Self-pay | Admitting: *Deleted

## 2022-07-02 DIAGNOSIS — C61 Malignant neoplasm of prostate: Secondary | ICD-10-CM

## 2022-07-02 DIAGNOSIS — N400 Enlarged prostate without lower urinary tract symptoms: Secondary | ICD-10-CM

## 2022-07-02 DIAGNOSIS — R972 Elevated prostate specific antigen [PSA]: Secondary | ICD-10-CM

## 2022-07-03 ENCOUNTER — Other Ambulatory Visit: Payer: Medicare HMO

## 2022-07-03 DIAGNOSIS — R972 Elevated prostate specific antigen [PSA]: Secondary | ICD-10-CM

## 2022-07-03 DIAGNOSIS — C61 Malignant neoplasm of prostate: Secondary | ICD-10-CM | POA: Diagnosis not present

## 2022-07-03 DIAGNOSIS — N400 Enlarged prostate without lower urinary tract symptoms: Secondary | ICD-10-CM

## 2022-07-04 LAB — PSA: Prostate Specific Ag, Serum: 5.4 ng/mL — ABNORMAL HIGH (ref 0.0–4.0)

## 2022-07-08 ENCOUNTER — Encounter: Payer: Medicare HMO | Admitting: Family Medicine

## 2022-07-15 ENCOUNTER — Ambulatory Visit: Payer: Medicare HMO | Admitting: Urology

## 2022-07-15 ENCOUNTER — Encounter: Payer: Self-pay | Admitting: Urology

## 2022-07-15 VITALS — BP 114/65 | HR 60 | Ht 72.0 in | Wt 170.0 lb

## 2022-07-15 DIAGNOSIS — R350 Frequency of micturition: Secondary | ICD-10-CM | POA: Diagnosis not present

## 2022-07-15 DIAGNOSIS — C61 Malignant neoplasm of prostate: Secondary | ICD-10-CM | POA: Diagnosis not present

## 2022-07-15 NOTE — Progress Notes (Signed)
07/15/2022 8:07 AM   Glenn Perez [birth year] [id number]  Referring provider: Jerrol Banana., MD No address on file  Chief Complaint  Patient presents with   Elevated PSA    Urologic history: 1.  Very low risk prostate cancer PSA 02/2021 5.1; 5.4 on repeat 04/2021 MRI 04/2021; PI-RADS 4 lesion mid-apical prostate anterior gland Volume 29 cc MR fusion biopsy 06/2021: 29 g prostate; ROI BX x4 negative 1/12 template cores Gleason 3+3 (30%) right apex Elected active surveillance  HPI: 76 y.o. male presents for 6 month follow-up.  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   REPLACEMENT TOTAL KNEE     SHOULDER SURGERY  10/2015   for partially torn rotator cuff and detached bicep tendon.    Home Medications:  Allergies as of 07/15/2022   No Known Allergies      Medication List        Accurate as of July 15, 2022  8:07 AM. If you have any questions, ask your nurse or doctor.          Actemra ACTPen 162 MG/0.9ML Soaj Generic drug: Tocilizumab Inject 1 each into the skin every 7 (seven) days.   fluticasone 50 MCG/ACT nasal spray Commonly known as: FLONASE Place 1 spray into the nose as needed.   Multi-Vitamin tablet Take 1 tablet by mouth daily.   predniSONE 20 MG  tablet Commonly known as: DELTASONE Take 10 mg by mouth daily with breakfast.        Allergies: No Known Allergies  Family History: Family History  Problem Relation Age of Onset   Diverticulosis Mother    Cancer Brother        unsure of what kind    Social History:  reports that he has never smoked. He has never used smokeless tobacco. He reports current alcohol use of about 1.0 standard drink of alcohol per week. He reports that he does not use drugs.   Physical Exam: BP 114/65   Pulse 60   Ht 6' (1.829 m)   Wt 170 lb (77.1 kg)   BMI 23.06 kg/m   Constitutional:  Alert and oriented, No acute distress. Respiratory: Normal respiratory effort, no increased work of breathing. GU: Prostate 30 g, smooth without nodules Psychiatric: Normal mood and affect.   Assessment & Plan:    1.  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  Recommend repeat prostate MRI prior to next appointment April 2023 and will discuss confirmatory biopsy further at that time We discussed that a medical oncologist involvement and prostate cancer is for more advanced disease.  If his wife desires a second opinion would recommend an appointment with  one of the urologic oncologists at [facility]  2.  Urinary frequency Not bothersome    Abbie Sons, [place] [address] [place], [place] [address] [phone]

## 2022-07-20 ENCOUNTER — Encounter (INDEPENDENT_AMBULATORY_CARE_PROVIDER_SITE_OTHER): Payer: Self-pay

## 2022-07-28 ENCOUNTER — Encounter: Payer: Self-pay | Admitting: Emergency Medicine

## 2022-07-28 ENCOUNTER — Emergency Department
Admission: EM | Admit: 2022-07-28 | Discharge: 2022-07-28 | Disposition: A | Discharge status: Home / Self Care | Payer: Medicare HMO | Attending: Emergency Medicine | Admitting: Emergency Medicine

## 2022-07-28 ENCOUNTER — Other Ambulatory Visit: Payer: Self-pay

## 2022-07-28 ENCOUNTER — Emergency Department: Payer: Medicare HMO

## 2022-07-28 DIAGNOSIS — R55 Syncope and collapse: Secondary | ICD-10-CM | POA: Insufficient documentation

## 2022-07-28 DIAGNOSIS — I4891 Unspecified atrial fibrillation: Secondary | ICD-10-CM | POA: Diagnosis not present

## 2022-07-28 HISTORY — DX: Malignant (primary) neoplasm, unspecified: C80.1

## 2022-07-28 LAB — URINALYSIS, ROUTINE W REFLEX MICROSCOPIC
Bilirubin Urine: NEGATIVE
Glucose, UA: NEGATIVE mg/dL
Hgb urine dipstick: NEGATIVE
Ketones, ur: NEGATIVE mg/dL
Leukocytes,Ua: NEGATIVE
Nitrite: NEGATIVE
Protein, ur: NEGATIVE mg/dL
Specific Gravity, Urine: 1.008 (ref 1.005–1.030)
pH: 6 (ref 5.0–8.0)

## 2022-07-28 LAB — BASIC METABOLIC PANEL
Anion gap: 7 (ref 5–15)
BUN: 24 mg/dL — ABNORMAL HIGH (ref 8–23)
CO2: 24 mmol/L (ref 22–32)
Calcium: 9.4 mg/dL (ref 8.9–10.3)
Chloride: 103 mmol/L (ref 98–111)
Creatinine, Ser: 0.97 mg/dL (ref 0.61–1.24)
GFR, Estimated: >60 mL/min (ref >60)
Glucose, Bld: 99 mg/dL (ref 70–99)
Potassium: 4.5 mmol/L (ref 3.5–5.1)
Sodium: 134 mmol/L — ABNORMAL LOW (ref 135–145)

## 2022-07-28 LAB — CBC
HCT: 41.1 % (ref 39.0–52.0)
Hemoglobin: 14 g/dL (ref 13.0–17.0)
MCH: 29 pg (ref 26.0–34.0)
MCHC: 34.1 g/dL (ref 30.0–36.0)
MCV: 85.3 fL (ref 80.0–100.0)
Platelets: 150 10*3/uL (ref 150–400)
RBC: 4.82 MIL/uL (ref 4.22–5.81)
RDW: 12.7 % (ref 11.5–15.5)
WBC: 6 10*3/uL (ref 4.0–10.5)
nRBC: 0 % (ref 0.0–0.2)

## 2022-07-28 LAB — TSH: TSH: 3.156 u[IU]/mL (ref 0.350–4.500)

## 2022-07-28 LAB — TROPONIN I (HIGH SENSITIVITY): Troponin I (High Sensitivity): 4 ng/L (ref <18)

## 2022-07-28 NOTE — ED Notes (Signed)
First Nurse Note: Pt to ED via POV for near syncopal episodes. Pt wife states that they have called their PCP and went to urgent care but were told to come to the ED. Pt is in NAD.

## 2022-07-28 NOTE — ED Notes (Signed)
Pt came from triage. Denies pain and dizziness. Sitting at bed with wife. Walked to bathroom and back without difficulty

## 2022-07-28 NOTE — ED Triage Notes (Signed)
Pt to ER reports 3 syncopal episodes in last week.  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  Pt has been followed by Dr. Clayborn Bigness in the past for syncope, but these episodes are different and more frequent.  Times have varied and have no common pre events.

## 2022-07-28 NOTE — ED Provider Notes (Signed)
[facility] Provider Note    Event Date/Time   First MD Initiated Contact with Patient 07/28/22 1000     (approximate)   History   Loss of Consciousness   HPI  Glenn Perez is a 76 y.o. male with past medical history significant for.cell arteritis, who presents to the emergency department following multiple syncopal episodes.  Patient states that over the past 1 month he has had approximately 5 episodes of syncope that have been witnessed by his wife.  States in the past 1 week he has had 3 episodes of syncope with his most recent episode today.  These episodes have occurred while sitting at rest and while standing.  Wife describes him as episodes of his eyes staying open, his pupils dilating and him "not being there well and yelling at his name".  States that this happens and he returns back to his normal in less than 10 minutes.  No tongue biting, urinary or bowel incontinence.  No obvious seizure-like activity.  No history of head trauma.  No seizures in the past.  States that he has been having normal p.o. intake.  Normal urine output for him.  Denies nausea vomiting, chest pain or shortness of breath.  No exertional symptoms.  Does not believe he had a prior stress testing or cardiac catheterization.  Prior syncopal episodes in the past.  States that 1 episode of syncope on the tennis court when EMS arrived he was found to be in atrial fibrillation.  States that he followed up with cardiology Dr. Letta Kocher after that visit however ultimately was not found to be in A-fib later and was not started on any medications.     Physical Exam   Triage Vital Signs: ED Triage Vitals  Enc Vitals Group     BP 07/28/22 0945 133/60     Pulse Rate 07/28/22 0945 (!) 56     Resp 07/28/22 0945 18     Temp 07/28/22 0945 97.9 F (36.6 C)     Temp Source 07/28/22 0945 Oral     SpO2 07/28/22 0945 100 %     Weight 07/28/22 0941 170 lb (77.1 kg)     Height 07/28/22 0941  6' (1.829 m)     Head Circumference --      Peak Flow --      Pain Score 07/28/22 0941 0     Pain Loc --      Pain Edu? --      Excl. in [place]? --     Most recent vital signs: Vitals:   07/28/22 1038 07/28/22 1115  BP: 137/81 (!) 146/84  Pulse: (!) 50 (!) 51  Resp: 12   Temp:    SpO2: 100%     Physical Exam Constitutional:      Appearance: He is well-developed.  HENT:     Head: Atraumatic.  Eyes:     Conjunctiva/sclera: Conjunctivae normal.  Cardiovascular:     Rate and Rhythm: Bradycardia present.     Heart sounds: No murmur heard. Pulmonary:     Effort: No respiratory distress.  Musculoskeletal:     Cervical back: Normal range of motion.  Skin:    General: Skin is warm.  Neurological:     Mental Status: He is alert. Mental status is at baseline.     Comments: Cranial nerves grossly intact.  5/5 strength bilateral upper and lower extremities.  Sensation intact in upper and lower extremities.  Normal gait.  IMPRESSION / MDM / ASSESSMENT AND PLAN / ED COURSE  I reviewed the triage vital signs and the nursing notes.  Differential diagnosis including dysrhythmia, ACS, electrolyte abnormality, seizure  Patient does not appear clinically dehydrated.  No active chest pain at this time and is at his mental status baseline.  EKG  My interpretation of the EKG -sinus bradycardia.  QTc 400. Normal intervals.  No chamber enlargement.  No significant ST elevation or depression.  No signs of acute ischemia or dysrhythmia.  No tachycardic or bradycardic dysrhythmias while on cardiac telemetry.  RADIOLOGY I independently reviewed imaging, my interpretation of imaging: CT head showed no signs of intracranial hemorrhage or infarction.    Read as no acute findings.  ED Results / Procedures / Treatments   Labs (all labs ordered are listed, but only abnormal results are displayed) Labs interpreted as -   Lab work without significant findings of electrolyte  abnormalities.  Does not appear clinically dehydrated.  Urine without signs of an infectious process.  Labs Reviewed  BASIC METABOLIC PANEL - Abnormal; Notable for the following components:      Result Value   Sodium 134 (*)    BUN 24 (*)    All other components within normal limits  URINALYSIS, ROUTINE W REFLEX MICROSCOPIC - Abnormal; Notable for the following components:   Color, Urine STRAW (*)    APPearance CLEAR (*)    All other components within normal limits  CBC  TSH  CBG MONITORING, ED  TROPONIN I (HIGH SENSITIVITY)    Consulted cardiology and discussed with Dr. Saralyn Pilar, recommended close outpatient follow-up with cardiology for outpatient cardiac telemetry monitoring.  Patient with bradycardia in the emergency department.  On review of outside records and vital signs patient has a long history of bradycardia.  Do not believe that this is causing him symptomatic bradycardia.  Discussed close follow-up with his cardiologist within the next week.  Given return precautions for any worsening symptoms.   PROCEDURES:  Critical Care performed: No  Procedures  Patient's presentation is most consistent with acute presentation with potential threat to life or bodily function.   MEDICATIONS ORDERED IN ED: Medications - No data to display  FINAL CLINICAL IMPRESSION(S) / ED DIAGNOSES   Final diagnoses:  Syncope, unspecified syncope type     Rx / DC Orders   ED Discharge Orders          Ordered    Ambulatory referral to Cardiology       Comments: If you have not heard from the Cardiology office within the next 72 hours please call [phone].   07/28/22 1131             Note:  This document was prepared using Dragon voice recognition software and may include unintentional dictation errors.   Nathaniel Man, MD 07/28/22 1133

## 2022-07-28 NOTE — Discharge Instructions (Addendum)
You were seen in the emergency department following episodes of passing out.  Your lab work was overall normal in the emergency department.  Your troponin (heart enzyme) was also normal.  Do not believe that you are having a heart attack to cause the symptoms.  The CT scan of your head did not show any findings to explain your symptoms.  Call your primary care physician today to schedule close follow-up appointment.  Call your cardiologist today to schedule close follow-up for outpatient cardiac monitoring.  Return to the emergency department if you have further episodes of passing out, chest pain or weakness.

## 2022-07-30 DIAGNOSIS — R55 Syncope and collapse: Secondary | ICD-10-CM | POA: Diagnosis not present

## 2022-07-30 DIAGNOSIS — E860 Dehydration: Secondary | ICD-10-CM | POA: Diagnosis not present

## 2022-07-30 DIAGNOSIS — M353 Polymyalgia rheumatica: Secondary | ICD-10-CM | POA: Diagnosis not present

## 2022-07-30 DIAGNOSIS — G459 Transient cerebral ischemic attack, unspecified: Secondary | ICD-10-CM | POA: Diagnosis not present

## 2022-07-30 DIAGNOSIS — I480 Paroxysmal atrial fibrillation: Secondary | ICD-10-CM | POA: Diagnosis not present

## 2022-07-30 DIAGNOSIS — R42 Dizziness and giddiness: Secondary | ICD-10-CM | POA: Diagnosis not present

## 2022-07-30 DIAGNOSIS — M1A00X0 Idiopathic chronic gout, unspecified site, without tophus (tophi): Secondary | ICD-10-CM | POA: Diagnosis not present

## 2022-07-31 DIAGNOSIS — G459 Transient cerebral ischemic attack, unspecified: Secondary | ICD-10-CM | POA: Diagnosis not present

## 2022-07-31 DIAGNOSIS — R55 Syncope and collapse: Secondary | ICD-10-CM | POA: Diagnosis not present

## 2022-07-31 DIAGNOSIS — Z8673 Personal history of transient ischemic attack (TIA), and cerebral infarction without residual deficits: Secondary | ICD-10-CM | POA: Diagnosis not present

## 2022-08-06 DIAGNOSIS — M316 Other giant cell arteritis: Secondary | ICD-10-CM | POA: Diagnosis not present

## 2022-08-06 DIAGNOSIS — R569 Unspecified convulsions: Secondary | ICD-10-CM | POA: Diagnosis not present

## 2022-08-07 DIAGNOSIS — M316 Other giant cell arteritis: Secondary | ICD-10-CM | POA: Diagnosis not present

## 2022-08-10 ENCOUNTER — Other Ambulatory Visit: Payer: Self-pay

## 2022-08-10 DIAGNOSIS — R569 Unspecified convulsions: Secondary | ICD-10-CM

## 2022-08-17 ENCOUNTER — Other Ambulatory Visit: Payer: Self-pay | Admitting: Neurology

## 2022-08-17 DIAGNOSIS — M353 Polymyalgia rheumatica: Secondary | ICD-10-CM | POA: Diagnosis not present

## 2022-08-17 DIAGNOSIS — R569 Unspecified convulsions: Secondary | ICD-10-CM

## 2022-08-17 DIAGNOSIS — R55 Syncope and collapse: Secondary | ICD-10-CM | POA: Diagnosis not present

## 2022-08-17 DIAGNOSIS — M1A00X0 Idiopathic chronic gout, unspecified site, without tophus (tophi): Secondary | ICD-10-CM | POA: Diagnosis not present

## 2022-08-17 DIAGNOSIS — I480 Paroxysmal atrial fibrillation: Secondary | ICD-10-CM | POA: Diagnosis not present

## 2022-08-17 DIAGNOSIS — R42 Dizziness and giddiness: Secondary | ICD-10-CM | POA: Diagnosis not present

## 2022-08-17 DIAGNOSIS — E860 Dehydration: Secondary | ICD-10-CM | POA: Diagnosis not present

## 2022-08-17 DIAGNOSIS — G459 Transient cerebral ischemic attack, unspecified: Secondary | ICD-10-CM | POA: Diagnosis not present

## 2022-08-19 DIAGNOSIS — R569 Unspecified convulsions: Secondary | ICD-10-CM | POA: Diagnosis not present

## 2022-08-19 DIAGNOSIS — M316 Other giant cell arteritis: Secondary | ICD-10-CM | POA: Diagnosis not present

## 2022-08-31 DIAGNOSIS — Z0000 Encounter for general adult medical examination without abnormal findings: Secondary | ICD-10-CM | POA: Diagnosis not present

## 2022-08-31 DIAGNOSIS — H47012 Ischemic optic neuropathy, left eye: Secondary | ICD-10-CM | POA: Diagnosis not present

## 2022-08-31 DIAGNOSIS — R350 Frequency of micturition: Secondary | ICD-10-CM | POA: Diagnosis not present

## 2022-08-31 DIAGNOSIS — M316 Other giant cell arteritis: Secondary | ICD-10-CM | POA: Diagnosis not present

## 2022-09-01 ENCOUNTER — Ambulatory Visit
Admission: RE | Admit: 2022-09-01 | Discharge: 2022-09-01 | Disposition: A | Discharge status: Home / Self Care | Payer: Medicare HMO | Source: Ambulatory Visit | Attending: Neurology | Admitting: Neurology

## 2022-09-01 DIAGNOSIS — I639 Cerebral infarction, unspecified: Secondary | ICD-10-CM | POA: Diagnosis not present

## 2022-09-01 DIAGNOSIS — R55 Syncope and collapse: Secondary | ICD-10-CM | POA: Diagnosis not present

## 2022-09-01 DIAGNOSIS — R569 Unspecified convulsions: Secondary | ICD-10-CM

## 2022-09-01 DIAGNOSIS — J3489 Other specified disorders of nose and nasal sinuses: Secondary | ICD-10-CM | POA: Diagnosis not present

## 2022-09-01 MED ORDER — GADOBENATE DIMEGLUMINE 529 MG/ML IV SOLN
15.0000 mL | Freq: Once | INTRAVENOUS | Status: AC | PRN
  Administered 2022-09-01: 15 mL via INTRAVENOUS

## 2022-09-08 DIAGNOSIS — R569 Unspecified convulsions: Secondary | ICD-10-CM | POA: Diagnosis not present

## 2022-10-06 DIAGNOSIS — U071 COVID-19: Secondary | ICD-10-CM | POA: Diagnosis not present

## 2022-10-06 DIAGNOSIS — Z03818 Encounter for observation for suspected exposure to other biological agents ruled out: Secondary | ICD-10-CM | POA: Diagnosis not present

## 2022-10-06 DIAGNOSIS — J0100 Acute maxillary sinusitis, unspecified: Secondary | ICD-10-CM | POA: Diagnosis not present

## 2022-10-19 DIAGNOSIS — R569 Unspecified convulsions: Secondary | ICD-10-CM | POA: Diagnosis not present

## 2022-10-19 DIAGNOSIS — M316 Other giant cell arteritis: Secondary | ICD-10-CM | POA: Diagnosis not present

## 2022-11-02 DIAGNOSIS — D2272 Melanocytic nevi of left lower limb, including hip: Secondary | ICD-10-CM | POA: Diagnosis not present

## 2022-11-02 DIAGNOSIS — D2261 Melanocytic nevi of right upper limb, including shoulder: Secondary | ICD-10-CM | POA: Diagnosis not present

## 2022-11-02 DIAGNOSIS — D2262 Melanocytic nevi of left upper limb, including shoulder: Secondary | ICD-10-CM | POA: Diagnosis not present

## 2022-11-02 DIAGNOSIS — L570 Actinic keratosis: Secondary | ICD-10-CM | POA: Diagnosis not present

## 2022-11-02 DIAGNOSIS — D485 Neoplasm of uncertain behavior of skin: Secondary | ICD-10-CM | POA: Diagnosis not present

## 2022-11-02 DIAGNOSIS — L820 Inflamed seborrheic keratosis: Secondary | ICD-10-CM | POA: Diagnosis not present

## 2022-11-02 DIAGNOSIS — L821 Other seborrheic keratosis: Secondary | ICD-10-CM | POA: Diagnosis not present

## 2022-11-02 DIAGNOSIS — D2271 Melanocytic nevi of right lower limb, including hip: Secondary | ICD-10-CM | POA: Diagnosis not present

## 2022-11-02 DIAGNOSIS — D225 Melanocytic nevi of trunk: Secondary | ICD-10-CM | POA: Diagnosis not present

## 2022-11-12 DIAGNOSIS — M316 Other giant cell arteritis: Secondary | ICD-10-CM | POA: Diagnosis not present

## 2022-12-17 ENCOUNTER — Telehealth: Payer: Self-pay | Admitting: Urology

## 2022-12-17 NOTE — Telephone Encounter (Signed)
Left message for patient to call us back.  

## 2022-12-17 NOTE — Telephone Encounter (Signed)
Patient called regarding order for MR Prostate W/WO Contrast. He got a call from scheduling and he said he wasn't aware of this, and has questions. He asked that someone call him back on Monday to discuss. He was getting ready to leave and wouldn't be available rest of today. I let him know we are closed Friday.

## 2022-12-24 DIAGNOSIS — L309 Dermatitis, unspecified: Secondary | ICD-10-CM | POA: Diagnosis not present

## 2022-12-24 DIAGNOSIS — L853 Xerosis cutis: Secondary | ICD-10-CM | POA: Diagnosis not present

## 2022-12-29 DIAGNOSIS — J0101 Acute recurrent maxillary sinusitis: Secondary | ICD-10-CM | POA: Diagnosis not present

## 2022-12-29 DIAGNOSIS — J329 Chronic sinusitis, unspecified: Secondary | ICD-10-CM | POA: Diagnosis not present

## 2022-12-30 ENCOUNTER — Ambulatory Visit
Admission: RE | Admit: 2022-12-30 | Discharge: 2022-12-30 | Disposition: A | Discharge status: Home / Self Care | Payer: Medicare HMO | Source: Ambulatory Visit | Attending: Urology | Admitting: Urology

## 2022-12-30 DIAGNOSIS — C61 Malignant neoplasm of prostate: Secondary | ICD-10-CM

## 2022-12-30 MED ORDER — GADOBUTROL 1 MMOL/ML IV SOLN
7.0000 mL | Freq: Once | INTRAVENOUS | Status: AC | PRN
  Administered 2022-12-30: 7 mL via INTRAVENOUS

## 2023-01-01 ENCOUNTER — Encounter: Payer: Self-pay | Admitting: Urology

## 2023-01-13 ENCOUNTER — Other Ambulatory Visit: Payer: Self-pay

## 2023-01-13 DIAGNOSIS — R350 Frequency of micturition: Secondary | ICD-10-CM

## 2023-01-13 DIAGNOSIS — C61 Malignant neoplasm of prostate: Secondary | ICD-10-CM

## 2023-01-14 ENCOUNTER — Other Ambulatory Visit: Payer: Medicare HMO

## 2023-01-14 DIAGNOSIS — C61 Malignant neoplasm of prostate: Secondary | ICD-10-CM | POA: Diagnosis not present

## 2023-01-15 ENCOUNTER — Encounter: Payer: Self-pay | Admitting: Urology

## 2023-01-15 ENCOUNTER — Ambulatory Visit: Payer: Medicare HMO | Admitting: Urology

## 2023-01-15 VITALS — BP 139/79 | HR 52 | Ht 72.0 in | Wt 170.0 lb

## 2023-01-15 DIAGNOSIS — C61 Malignant neoplasm of prostate: Secondary | ICD-10-CM | POA: Diagnosis not present

## 2023-01-15 LAB — PSA: Prostate Specific Ag, Serum: 5.8 ng/mL — ABNORMAL HIGH (ref 0.0–4.0)

## 2023-01-15 NOTE — Progress Notes (Signed)
I, Glenn Perez,acting as a scribe for Glenn Altes, MD.,have documented all relevant documentation on the behalf of Glenn Altes, MD,as directed by  Glenn Altes, MD while in the presence of Glenn Altes, MD.   I, Glenn Perez,acting as a scribe for Glenn Altes, MD.,have documented all relevant documentation on the behalf of Glenn Altes, MD,as directed by  Glenn Altes, MD while in the presence of Glenn Altes, MD.   01/15/2023 8:53 AM   Glenn Perez 1945/12/07 [id number]  Referring provider: Bosie Clos, MD [address] [place],  [place] [address]  Chief Complaint  Patient presents with   Prostate Cancer   Urologic history: 1.  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 male here for 6 month follow-up visit.   Doing well since last visit No bothersome LUTS Denies dysuria, gross hematuria Denies flank, abdominal or pelvic pain  PSA drawn yesterday is pending. Follow-up prostate MRI 12-30-2022 showed a stable lesion initially categorized as PI-RADS 4 on his original MRI. The interpreting radiologist stated the lesion was stable, but it was classified as a PI-RADS 3.   PSA trend  Prostate Specific Ag, Serum  Latest Ref Rng 0.0 - 4.0 ng/mL  10/20/2018 4.0   03/12/2021 5.1 (H)   05/01/2021 5.4 (H)   01/01/2022 3.8   07/03/2022 5.4 (H)     PMH: Past Medical History:  Diagnosis Date   Cancer (HCC)    prostate   Giant cell arteritis (HCC)    Syncope     Surgical History: Past Surgical History:  Procedure Laterality Date   APPENDECTOMY     ARTERY BIOPSY Left 05/20/2021   Procedure: BIOPSY TEMPORAL ARTERY;  Surgeon: Renford Dills, MD;  Location: [facility] ORS;  Service:  Vascular;  Laterality: Left;   KNEE SURGERY     NOSE SURGERY     x 2.   REPLACEMENT TOTAL KNEE     SHOULDER SURGERY  10/2015   for partially torn rotator cuff and detached bicep tendon.    Home Medications:  Allergies as of 01/15/2023   No Known Allergies      Medication List        Accurate as of January 15, 2023  8:53 AM. If you have any questions, ask your nurse or doctor.          STOP taking these medications    Actemra ACTPen 162 MG/0.9ML Soaj Generic drug: Tocilizumab Stopped by: Glenn Altes, MD   fluticasone 50 MCG/ACT nasal spray Commonly known as: FLONASE Stopped by: Glenn Altes, MD   Multi-Vitamin tablet Stopped by: Glenn Altes, MD   predniSONE 20 MG tablet Commonly known as: DELTASONE Stopped by: Glenn Altes, MD        Allergies: No Known Allergies  Family History: Family History  Problem Relation Age of Onset   Diverticulosis Mother    Cancer Brother        unsure of what kind    Social History:  reports that he has never smoked. He has never used smokeless tobacco. He reports current alcohol use of about 1.0 standard drink of alcohol per week. He reports that he does not  use drugs.   Physical Exam: BP 139/79   Pulse (!) 52   Ht 6' (1.829 m)   Wt 170 lb (77.1 kg)   BMI 23.06 kg/m   Constitutional:  Alert and oriented, No acute distress. HEENT: [place] AT Respiratory: Normal respiratory effort, no increased work of breathing. Psychiatric: Normal mood and affect.   Pertinent Imaging:  EXAM: MR PROSTATE WITHOUT AND WITH CONTRAST   TECHNIQUE: Multiplanar multisequence MRI images were obtained of the pelvis centered about the prostate. Pre and post contrast images were obtained.   CONTRAST:  7mL GADAVIST GADOBUTROL 1 MMOL/ML IV SOLN   COMPARISON:  MRI 05/15/2021   FINDINGS: Prostate: Mild heterogeneity of signal intensity within the peripheral zone on T2 weighted imaging (series 10). No focal lesion identified.  There are no foci of restricted diffusion within the peripheral zone (series 8)   Again demonstrated region of low signal intensity with most anterior apical region of the transitional zone measuring 1.3 by 0.8 cm (image 20/series 6). There is restricted diffusion through this region. (Image 21/series 8). Findings not changed from comparison exam.   Volume: 4.2 x 3.0 by 3.9 (volume = 26)   Transcapsular spread:  Absent   Seminal vesicle involvement: Absent   Neurovascular bundle involvement: Absent   Pelvic adenopathy: Absent   Bone metastasis: Absent   Other findings: None   IMPRESSION: 1. No high-grade carcinoma identified in the peripheral zone. Heterogeneous signal intensity suggest prior prostate inflammation. PI-RADS: 2 2. Region of low signal intensity within the anterior apical transitional zone unchanged from prior. PI-RADS: 3 3. Enlarged nodular transitional zone most consistent with benign prostate hypertrophy. PI-RADS: 2   Electronically Signed   By: Genevive Bi M.D.   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  We again discussed the recommendation of a confirmatory biopsy within 1-2 years of his initial biopsy. October 2024 will be two years and will be scheduled at his convenience. Since his prior ROI biopsies were negative and the most recent MRI downgraded the area to PI-RADS 3, do not feel he needs a repeat fusion biopsy and will perform a standard template biopsy. He was offered to schedule this in the office or in same day surgery under sedation. He will think over these options.  We will call with his PSA results.  I have reviewed the above documentation for accuracy and completeness, and I agree with the above.   Glenn Altes, MD  [facility] [address], [address] [place],  [place] [address] [phone]

## 2023-01-20 ENCOUNTER — Encounter: Payer: Self-pay | Admitting: *Deleted

## 2023-01-21 DIAGNOSIS — M9903 Segmental and somatic dysfunction of lumbar region: Secondary | ICD-10-CM | POA: Diagnosis not present

## 2023-01-21 DIAGNOSIS — M9902 Segmental and somatic dysfunction of thoracic region: Secondary | ICD-10-CM | POA: Diagnosis not present

## 2023-01-21 DIAGNOSIS — M9901 Segmental and somatic dysfunction of cervical region: Secondary | ICD-10-CM | POA: Diagnosis not present

## 2023-01-21 DIAGNOSIS — M545 Low back pain, unspecified: Secondary | ICD-10-CM | POA: Diagnosis not present

## 2023-01-22 DIAGNOSIS — M9903 Segmental and somatic dysfunction of lumbar region: Secondary | ICD-10-CM | POA: Diagnosis not present

## 2023-01-22 DIAGNOSIS — M9902 Segmental and somatic dysfunction of thoracic region: Secondary | ICD-10-CM | POA: Diagnosis not present

## 2023-01-22 DIAGNOSIS — M545 Low back pain, unspecified: Secondary | ICD-10-CM | POA: Diagnosis not present

## 2023-01-22 DIAGNOSIS — M9901 Segmental and somatic dysfunction of cervical region: Secondary | ICD-10-CM | POA: Diagnosis not present

## 2023-01-25 DIAGNOSIS — M9903 Segmental and somatic dysfunction of lumbar region: Secondary | ICD-10-CM | POA: Diagnosis not present

## 2023-01-25 DIAGNOSIS — M545 Low back pain, unspecified: Secondary | ICD-10-CM | POA: Diagnosis not present

## 2023-01-25 DIAGNOSIS — M9901 Segmental and somatic dysfunction of cervical region: Secondary | ICD-10-CM | POA: Diagnosis not present

## 2023-01-25 DIAGNOSIS — M9902 Segmental and somatic dysfunction of thoracic region: Secondary | ICD-10-CM | POA: Diagnosis not present

## 2023-01-26 ENCOUNTER — Telehealth (INDEPENDENT_AMBULATORY_CARE_PROVIDER_SITE_OTHER): Payer: Self-pay

## 2023-01-26 DIAGNOSIS — Z8042 Family history of malignant neoplasm of prostate: Secondary | ICD-10-CM | POA: Diagnosis not present

## 2023-01-26 DIAGNOSIS — C61 Malignant neoplasm of prostate: Secondary | ICD-10-CM | POA: Diagnosis not present

## 2023-01-26 NOTE — Telephone Encounter (Signed)
He can see GS to evaluate

## 2023-01-26 NOTE — Telephone Encounter (Signed)
Patient left a message stating that the incision from the temporal artery biopsy done on 05/20/21 has area that is tender between the left eye and ear. The patient informed that he is not able to shave in that area and it feels like stitch is coming through. Patient was last seen 05/24/2021 Patient is requesting to be seen. Please Advise

## 2023-01-29 DIAGNOSIS — M9903 Segmental and somatic dysfunction of lumbar region: Secondary | ICD-10-CM | POA: Diagnosis not present

## 2023-01-29 DIAGNOSIS — M9901 Segmental and somatic dysfunction of cervical region: Secondary | ICD-10-CM | POA: Diagnosis not present

## 2023-01-29 DIAGNOSIS — M9902 Segmental and somatic dysfunction of thoracic region: Secondary | ICD-10-CM | POA: Diagnosis not present

## 2023-01-29 DIAGNOSIS — M545 Low back pain, unspecified: Secondary | ICD-10-CM | POA: Diagnosis not present

## 2023-02-08 ENCOUNTER — Encounter (INDEPENDENT_AMBULATORY_CARE_PROVIDER_SITE_OTHER): Payer: Self-pay | Admitting: Vascular Surgery

## 2023-02-08 ENCOUNTER — Ambulatory Visit (INDEPENDENT_AMBULATORY_CARE_PROVIDER_SITE_OTHER): Payer: Medicare HMO | Admitting: Vascular Surgery

## 2023-02-08 VITALS — BP 134/65 | HR 60 | Resp 16 | Wt 177.0 lb

## 2023-02-08 DIAGNOSIS — M1990 Unspecified osteoarthritis, unspecified site: Secondary | ICD-10-CM | POA: Diagnosis not present

## 2023-02-08 DIAGNOSIS — M316 Other giant cell arteritis: Secondary | ICD-10-CM | POA: Diagnosis not present

## 2023-02-08 DIAGNOSIS — I480 Paroxysmal atrial fibrillation: Secondary | ICD-10-CM | POA: Diagnosis not present

## 2023-02-08 NOTE — Progress Notes (Signed)
MRN : [id number]  Glenn Perez is a 77 y.o. (1945/10/30) male who presents with chief complaint of check circulation.  History of Present Illness:   The patient returns to the office with concerns regarding the surgical site.  He is status post Left temporal artery biopsy on May 20, 2021.  Shortly after the surgery he noted a very tender painful spot in the inferior aspect of the incision.  This was created enough problem that he literally has been shaving around it.  It bothered him to the point where he is scheduled the appointment to see me.  However, approximately 3 days ago he went ahead and shave the area and in doing so felt a catch or a tug and subsequently his symptoms have resolved.  Current Meds  Medication Sig   Multiple Vitamin (MULTIVITAMIN) capsule Take 1 capsule by mouth daily.    Past Medical History:  Diagnosis Date   Cancer [facility])    prostate   Giant cell arteritis (HCC)    Syncope     Past Surgical History:  Procedure Laterality Date   APPENDECTOMY     ARTERY BIOPSY Left 05/20/2021   Procedure: BIOPSY TEMPORAL ARTERY;  Surgeon: Renford Dills, MD;  Location: [facility] ORS;  Service: Vascular;  Laterality: Left;   KNEE SURGERY     NOSE SURGERY     x 2.   REPLACEMENT TOTAL KNEE     SHOULDER SURGERY  10/2015   for partially torn rotator cuff and detached bicep tendon.    Social History Social History   Tobacco Use   Smoking status: Never   Smokeless tobacco: Never  Vaping Use   Vaping Use: Never used  Substance Use Topics   Alcohol use: Yes    Alcohol/week: 1.0 standard drink of alcohol    Types: 1 Standard drinks or equivalent per week   Drug use: No    Family History Family History  Problem Relation Age of Onset   Diverticulosis Mother    Cancer Brother        unsure of what kind    No Known Allergies   REVIEW OF SYSTEMS (Negative unless  checked)  Constitutional: [] Weight loss  [] Fever  [] Chills Cardiac: [] Chest pain   [] Chest pressure   [] Palpitations   [] Shortness of breath when laying flat   [] Shortness of breath with exertion. Vascular:  [x] Pain in legs with walking   [] Pain in legs at rest  [] History of DVT   [] Phlebitis   [] Swelling in legs   [] Varicose veins   [] Non-healing ulcers Pulmonary:   [] Uses home oxygen   [] Productive cough   [] Hemoptysis   [] Wheeze  [] COPD   [] Asthma Neurologic:  [] Dizziness   [] Seizures   [] History of stroke   [] History of TIA  [] Aphasia   [] Vissual changes   [] Weakness or numbness in arm   [] Weakness or numbness in leg Musculoskeletal:   [] Joint swelling   [] Joint pain   [] Low back pain Hematologic:  [] Easy bruising  [] Easy bleeding   [] Hypercoagulable state   [] Anemic Gastrointestinal:  [] Diarrhea   [] Vomiting  []   Gastroesophageal reflux/heartburn   [] Difficulty swallowing. Genitourinary:  [] Chronic kidney disease   [] Difficult urination  [] Frequent urination   [] Blood in urine Skin:  [] Rashes   [] Ulcers  Psychological:  [] History of anxiety   []  History of major depression.  Physical Examination  Vitals:   02/08/23 0900  BP: 134/65  Pulse: 60  Resp: 16  Weight: 177 lb (80.3 kg)   Body mass index is 24.01 kg/m. Gen: WD/WN, NAD Head: [place]/AT, No temporalis wasting.  Ear/Nose/Throat: Hearing grossly intact, nares w/o erythema or drainage Eyes: PER, EOMI, sclera nonicteric.  Neck: Supple, no masses.  No bruit or JVD.  Pulmonary:  Good air movement, no audible wheezing, no use of accessory muscles.  Cardiac: RRR, normal S1, S2, no Murmurs. Vascular: Surgical site appears clean dry and intact the exact spot that he is describing has a very fine small scab  Radial Palpable Palpable  Gastrointestinal: soft, non-distended. No guarding/no peritoneal signs.  Musculoskeletal: M/S 5/5 throughout.  No visible deformity.  Neurologic: CN 2-12 intact. Pain and light touch intact in extremities.   Symmetrical.  Speech is fluent. Motor exam as listed above. Psychiatric: Judgment intact, Mood & affect appropriate for pt's clinical situation. Dermatologic: No rashes or ulcers noted.  No changes consistent with cellulitis.   CBC Lab Results  Component Value Date   WBC 6.0 07/28/2022   HGB 14.0 07/28/2022   HCT 41.1 07/28/2022   MCV 85.3 07/28/2022   PLT 150 07/28/2022    BMET    Component Value Date/Time   NA 134 (L) 07/28/2022 0950   NA 141 03/12/2021 1626   NA 137 01/03/2014 1344   K 4.5 07/28/2022 0950   K 3.9 01/03/2014 1344   CL 103 07/28/2022 0950   CL 104 01/03/2014 1344   CO2 24 07/28/2022 0950   CO2 30 01/03/2014 1344   GLUCOSE 99 07/28/2022 0950   GLUCOSE 95 01/03/2014 1344   BUN 24 (H) 07/28/2022 0950   BUN 13 03/12/2021 1626   BUN 15 01/03/2014 1344   CREATININE 0.97 07/28/2022 0950   CREATININE 0.90 01/03/2014 1344   CALCIUM 9.4 07/28/2022 0950   CALCIUM 8.7 01/03/2014 1344   GFRNONAA >60 07/28/2022 0950   GFRNONAA >60 01/03/2014 1344   GFRAA 103 04/19/2019 0000   GFRAA >60 01/03/2014 1344   CrCl cannot be calculated (Patient's most recent lab result is older than the maximum 21 days allowed.).  COAG Lab Results  Component Value Date   INR 1.0 04/19/2019   INR 1.7 (H) 06/01/2009   INR 2.1 (H) 05/31/2009    Radiology No results found.   Assessment/Plan 1. Temporal arteritis (HCC) I believe the patient has been dealing with the not from the Monocryl suture and has been slowly "spitting" the stitch.  During shaving he has essentially removed the foreign material and in its entirety.  No further treatment is needed.  He should be fine and will follow-up with me as needed.  2. Paroxysmal A-fib (HCC) Continue antiarrhythmia medications as already ordered, these medications have been reviewed and there are no changes at this time.  Continue anticoagulation as ordered by Cardiology Service  3. Arthritis Continue NSAID medications as already  ordered, these medications have been reviewed and there are no changes at this time.  Continued activity and therapy was stressed.    Levora Dredge, MD  02/08/2023 9:19 AM

## 2023-02-09 DIAGNOSIS — M19012 Primary osteoarthritis, left shoulder: Secondary | ICD-10-CM | POA: Diagnosis not present

## 2023-02-12 ENCOUNTER — Ambulatory Visit: Payer: Medicare HMO | Admitting: Urology

## 2023-02-12 ENCOUNTER — Encounter: Payer: Self-pay | Admitting: Urology

## 2023-02-12 VITALS — BP 130/70 | HR 53 | Ht 72.0 in | Wt 170.0 lb

## 2023-02-12 DIAGNOSIS — R972 Elevated prostate specific antigen [PSA]: Secondary | ICD-10-CM | POA: Diagnosis not present

## 2023-02-12 DIAGNOSIS — Z2989 Encounter for other specified prophylactic measures: Secondary | ICD-10-CM | POA: Diagnosis not present

## 2023-02-12 DIAGNOSIS — C61 Malignant neoplasm of prostate: Secondary | ICD-10-CM

## 2023-02-12 MED ORDER — LEVOFLOXACIN 500 MG PO TABS
500.0000 mg | ORAL_TABLET | Freq: Once | ORAL | Status: AC
  Administered 2023-02-12: 500 mg via ORAL

## 2023-02-12 MED ORDER — GENTAMICIN SULFATE 40 MG/ML IJ SOLN
80.0000 mg | Freq: Once | INTRAMUSCULAR | Status: AC
  Administered 2023-02-12: 80 mg via INTRAMUSCULAR

## 2023-02-12 NOTE — Patient Instructions (Signed)

## 2023-02-12 NOTE — Progress Notes (Signed)
   Prostate Biopsy Procedure   Urologic history: 1.  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  A time out was performed to ensure correct patient identity.  Pre-Procedure: - Last PSA Level: 01/14/2023 5.8 - Gentamicin given prophylactically - Levaquin 500 mg administered PO -Transrectal Ultrasound performed revealing a 37 gm prostate -No significant hypoechoic or median lobe noted  Procedure: - Prostate block performed using 10 cc 1% lidocaine and biopsies taken from sextant areas, a total of 12 under ultrasound guidance.  Post-Procedure: - Patient tolerated the procedure well - He was counseled to seek immediate medical attention if experiences any severe pain, significant bleeding, or fevers - Will call w/biopsy results   Irineo Axon, MD

## 2023-02-19 ENCOUNTER — Telehealth: Payer: Self-pay | Admitting: Urology

## 2023-02-19 ENCOUNTER — Encounter: Payer: Self-pay | Admitting: *Deleted

## 2023-02-19 NOTE — Telephone Encounter (Signed)
I contacted Glenn Perez to discuss his recent prostate biopsy result.  He had no postbiopsy problems.  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  This was the same region that was positive on his initial biopsy.  We discussed the repeat biopsy confirms low risk disease and no increased cancer volume.  We discussed the recommended management option for low risk disease is active surveillance which she has elected to continue  Will schedule a 47-month follow-up office visit with PSA prior.

## 2023-02-27 DIAGNOSIS — R55 Syncope and collapse: Secondary | ICD-10-CM | POA: Diagnosis not present

## 2023-02-27 DIAGNOSIS — Z950 Presence of cardiac pacemaker: Secondary | ICD-10-CM | POA: Diagnosis not present

## 2023-02-28 DIAGNOSIS — D6859 Other primary thrombophilia: Secondary | ICD-10-CM | POA: Diagnosis not present

## 2023-02-28 DIAGNOSIS — Z7952 Long term (current) use of systemic steroids: Secondary | ICD-10-CM | POA: Diagnosis not present

## 2023-02-28 DIAGNOSIS — Z96653 Presence of artificial knee joint, bilateral: Secondary | ICD-10-CM | POA: Diagnosis not present

## 2023-02-28 DIAGNOSIS — R54 Age-related physical debility: Secondary | ICD-10-CM | POA: Diagnosis not present

## 2023-02-28 DIAGNOSIS — I455 Other specified heart block: Secondary | ICD-10-CM | POA: Diagnosis not present

## 2023-02-28 DIAGNOSIS — Z8616 Personal history of COVID-19: Secondary | ICD-10-CM | POA: Diagnosis not present

## 2023-02-28 DIAGNOSIS — Z950 Presence of cardiac pacemaker: Secondary | ICD-10-CM | POA: Diagnosis not present

## 2023-02-28 DIAGNOSIS — I495 Sick sinus syndrome: Secondary | ICD-10-CM | POA: Diagnosis not present

## 2023-02-28 DIAGNOSIS — E785 Hyperlipidemia, unspecified: Secondary | ICD-10-CM | POA: Diagnosis not present

## 2023-02-28 DIAGNOSIS — M316 Other giant cell arteritis: Secondary | ICD-10-CM | POA: Diagnosis not present

## 2023-02-28 DIAGNOSIS — I4891 Unspecified atrial fibrillation: Secondary | ICD-10-CM | POA: Diagnosis not present

## 2023-02-28 DIAGNOSIS — E860 Dehydration: Secondary | ICD-10-CM | POA: Diagnosis not present

## 2023-02-28 DIAGNOSIS — R001 Bradycardia, unspecified: Secondary | ICD-10-CM | POA: Diagnosis not present

## 2023-02-28 DIAGNOSIS — I480 Paroxysmal atrial fibrillation: Secondary | ICD-10-CM | POA: Diagnosis not present

## 2023-02-28 DIAGNOSIS — I080 Rheumatic disorders of both mitral and aortic valves: Secondary | ICD-10-CM | POA: Diagnosis not present

## 2023-02-28 DIAGNOSIS — J939 Pneumothorax, unspecified: Secondary | ICD-10-CM | POA: Diagnosis not present

## 2023-02-28 DIAGNOSIS — R55 Syncope and collapse: Secondary | ICD-10-CM | POA: Diagnosis not present

## 2023-02-28 DIAGNOSIS — Z95810 Presence of automatic (implantable) cardiac defibrillator: Secondary | ICD-10-CM | POA: Diagnosis not present

## 2023-03-03 DIAGNOSIS — I495 Sick sinus syndrome: Secondary | ICD-10-CM | POA: Diagnosis not present

## 2023-03-03 DIAGNOSIS — Z95810 Presence of automatic (implantable) cardiac defibrillator: Secondary | ICD-10-CM | POA: Diagnosis not present

## 2023-03-03 DIAGNOSIS — J939 Pneumothorax, unspecified: Secondary | ICD-10-CM | POA: Diagnosis not present

## 2023-03-03 DIAGNOSIS — I480 Paroxysmal atrial fibrillation: Secondary | ICD-10-CM | POA: Diagnosis not present

## 2023-03-04 DIAGNOSIS — R55 Syncope and collapse: Secondary | ICD-10-CM | POA: Diagnosis not present

## 2023-03-17 DIAGNOSIS — I495 Sick sinus syndrome: Secondary | ICD-10-CM | POA: Diagnosis not present

## 2023-03-17 DIAGNOSIS — I4891 Unspecified atrial fibrillation: Secondary | ICD-10-CM | POA: Diagnosis not present

## 2023-03-17 DIAGNOSIS — Z45010 Encounter for checking and testing of cardiac pacemaker pulse generator [battery]: Secondary | ICD-10-CM | POA: Diagnosis not present

## 2023-03-17 DIAGNOSIS — Z950 Presence of cardiac pacemaker: Secondary | ICD-10-CM | POA: Diagnosis not present

## 2023-03-17 DIAGNOSIS — Z45018 Encounter for adjustment and management of other part of cardiac pacemaker: Secondary | ICD-10-CM | POA: Diagnosis not present

## 2023-03-17 DIAGNOSIS — Z8546 Personal history of malignant neoplasm of prostate: Secondary | ICD-10-CM | POA: Diagnosis not present

## 2023-03-17 DIAGNOSIS — M436 Torticollis: Secondary | ICD-10-CM | POA: Diagnosis not present

## 2023-03-17 DIAGNOSIS — M549 Dorsalgia, unspecified: Secondary | ICD-10-CM | POA: Diagnosis not present

## 2023-03-24 DIAGNOSIS — H47012 Ischemic optic neuropathy, left eye: Secondary | ICD-10-CM | POA: Diagnosis not present

## 2023-03-29 DIAGNOSIS — H47012 Ischemic optic neuropathy, left eye: Secondary | ICD-10-CM | POA: Diagnosis not present

## 2023-03-29 DIAGNOSIS — H2512 Age-related nuclear cataract, left eye: Secondary | ICD-10-CM | POA: Diagnosis not present

## 2023-03-29 DIAGNOSIS — Z950 Presence of cardiac pacemaker: Secondary | ICD-10-CM | POA: Diagnosis not present

## 2023-03-29 DIAGNOSIS — Z961 Presence of intraocular lens: Secondary | ICD-10-CM | POA: Diagnosis not present

## 2023-03-31 DIAGNOSIS — Z09 Encounter for follow-up examination after completed treatment for conditions other than malignant neoplasm: Secondary | ICD-10-CM | POA: Diagnosis not present

## 2023-03-31 DIAGNOSIS — Z86010 Personal history of colonic polyps: Secondary | ICD-10-CM | POA: Diagnosis not present

## 2023-04-13 DIAGNOSIS — H6123 Impacted cerumen, bilateral: Secondary | ICD-10-CM | POA: Diagnosis not present

## 2023-04-13 DIAGNOSIS — H903 Sensorineural hearing loss, bilateral: Secondary | ICD-10-CM | POA: Diagnosis not present

## 2023-04-30 DIAGNOSIS — D2271 Melanocytic nevi of right lower limb, including hip: Secondary | ICD-10-CM | POA: Diagnosis not present

## 2023-04-30 DIAGNOSIS — L821 Other seborrheic keratosis: Secondary | ICD-10-CM | POA: Diagnosis not present

## 2023-04-30 DIAGNOSIS — L570 Actinic keratosis: Secondary | ICD-10-CM | POA: Diagnosis not present

## 2023-04-30 DIAGNOSIS — D225 Melanocytic nevi of trunk: Secondary | ICD-10-CM | POA: Diagnosis not present

## 2023-04-30 DIAGNOSIS — D2262 Melanocytic nevi of left upper limb, including shoulder: Secondary | ICD-10-CM | POA: Diagnosis not present

## 2023-04-30 DIAGNOSIS — D2272 Melanocytic nevi of left lower limb, including hip: Secondary | ICD-10-CM | POA: Diagnosis not present

## 2023-04-30 DIAGNOSIS — D2261 Melanocytic nevi of right upper limb, including shoulder: Secondary | ICD-10-CM | POA: Diagnosis not present

## 2023-04-30 DIAGNOSIS — C44329 Squamous cell carcinoma of skin of other parts of face: Secondary | ICD-10-CM | POA: Diagnosis not present

## 2023-04-30 DIAGNOSIS — D485 Neoplasm of uncertain behavior of skin: Secondary | ICD-10-CM | POA: Diagnosis not present

## 2023-05-13 DIAGNOSIS — Z8739 Personal history of other diseases of the musculoskeletal system and connective tissue: Secondary | ICD-10-CM | POA: Diagnosis not present

## 2023-05-13 DIAGNOSIS — Z950 Presence of cardiac pacemaker: Secondary | ICD-10-CM | POA: Diagnosis not present

## 2023-05-13 DIAGNOSIS — Z45018 Encounter for adjustment and management of other part of cardiac pacemaker: Secondary | ICD-10-CM | POA: Diagnosis not present

## 2023-05-13 DIAGNOSIS — M25531 Pain in right wrist: Secondary | ICD-10-CM | POA: Diagnosis not present

## 2023-05-13 DIAGNOSIS — I495 Sick sinus syndrome: Secondary | ICD-10-CM | POA: Diagnosis not present

## 2023-05-19 DIAGNOSIS — H903 Sensorineural hearing loss, bilateral: Secondary | ICD-10-CM | POA: Diagnosis not present

## 2023-05-21 DIAGNOSIS — H524 Presbyopia: Secondary | ICD-10-CM | POA: Diagnosis not present

## 2023-05-21 DIAGNOSIS — H2512 Age-related nuclear cataract, left eye: Secondary | ICD-10-CM | POA: Diagnosis not present

## 2023-05-21 DIAGNOSIS — H5203 Hypermetropia, bilateral: Secondary | ICD-10-CM | POA: Diagnosis not present

## 2023-05-21 DIAGNOSIS — Z9841 Cataract extraction status, right eye: Secondary | ICD-10-CM | POA: Diagnosis not present

## 2023-05-21 DIAGNOSIS — H52223 Regular astigmatism, bilateral: Secondary | ICD-10-CM | POA: Diagnosis not present

## 2023-06-07 DIAGNOSIS — R9431 Abnormal electrocardiogram [ECG] [EKG]: Secondary | ICD-10-CM | POA: Diagnosis not present

## 2023-06-07 DIAGNOSIS — Z950 Presence of cardiac pacemaker: Secondary | ICD-10-CM | POA: Diagnosis not present

## 2023-06-07 DIAGNOSIS — Z45018 Encounter for adjustment and management of other part of cardiac pacemaker: Secondary | ICD-10-CM | POA: Diagnosis not present

## 2023-06-07 DIAGNOSIS — I080 Rheumatic disorders of both mitral and aortic valves: Secondary | ICD-10-CM | POA: Diagnosis not present

## 2023-06-07 DIAGNOSIS — I495 Sick sinus syndrome: Secondary | ICD-10-CM | POA: Diagnosis not present

## 2023-06-07 DIAGNOSIS — I480 Paroxysmal atrial fibrillation: Secondary | ICD-10-CM | POA: Diagnosis not present

## 2023-06-08 DIAGNOSIS — Z45018 Encounter for adjustment and management of other part of cardiac pacemaker: Secondary | ICD-10-CM | POA: Diagnosis not present

## 2023-06-09 DIAGNOSIS — L578 Other skin changes due to chronic exposure to nonionizing radiation: Secondary | ICD-10-CM | POA: Diagnosis not present

## 2023-06-09 DIAGNOSIS — L988 Other specified disorders of the skin and subcutaneous tissue: Secondary | ICD-10-CM | POA: Diagnosis not present

## 2023-06-09 DIAGNOSIS — C44329 Squamous cell carcinoma of skin of other parts of face: Secondary | ICD-10-CM | POA: Diagnosis not present

## 2023-06-09 DIAGNOSIS — L814 Other melanin hyperpigmentation: Secondary | ICD-10-CM | POA: Diagnosis not present

## 2023-06-15 ENCOUNTER — Encounter: Payer: Self-pay | Admitting: Internal Medicine

## 2023-06-16 ENCOUNTER — Ambulatory Visit
Admission: RE | Admit: 2023-06-16 | Discharge: 2023-06-16 | Disposition: A | Discharge status: Home / Self Care | Payer: Medicare HMO | Attending: Internal Medicine | Admitting: Internal Medicine

## 2023-06-16 ENCOUNTER — Ambulatory Visit: Payer: Medicare HMO | Admitting: Anesthesiology

## 2023-06-16 ENCOUNTER — Other Ambulatory Visit: Payer: Self-pay

## 2023-06-16 ENCOUNTER — Encounter
Admission: RE | Disposition: A | Discharge status: Home / Self Care | Payer: Self-pay | Source: Home / Self Care | Attending: Internal Medicine

## 2023-06-16 ENCOUNTER — Encounter: Payer: Self-pay | Admitting: Internal Medicine

## 2023-06-16 DIAGNOSIS — Z1211 Encounter for screening for malignant neoplasm of colon: Secondary | ICD-10-CM | POA: Insufficient documentation

## 2023-06-16 DIAGNOSIS — K648 Other hemorrhoids: Secondary | ICD-10-CM | POA: Diagnosis not present

## 2023-06-16 DIAGNOSIS — Z86010 Personal history of colonic polyps: Secondary | ICD-10-CM | POA: Insufficient documentation

## 2023-06-16 DIAGNOSIS — K5730 Diverticulosis of large intestine without perforation or abscess without bleeding: Secondary | ICD-10-CM | POA: Insufficient documentation

## 2023-06-16 DIAGNOSIS — K640 First degree hemorrhoids: Secondary | ICD-10-CM | POA: Insufficient documentation

## 2023-06-16 DIAGNOSIS — M316 Other giant cell arteritis: Secondary | ICD-10-CM | POA: Diagnosis not present

## 2023-06-16 DIAGNOSIS — Z09 Encounter for follow-up examination after completed treatment for conditions other than malignant neoplasm: Secondary | ICD-10-CM | POA: Diagnosis not present

## 2023-06-16 HISTORY — DX: Cardiac arrhythmia, unspecified: I49.9

## 2023-06-16 HISTORY — PX: COLONOSCOPY WITH PROPOFOL: SHX5780

## 2023-06-16 SURGERY — COLONOSCOPY WITH PROPOFOL
Anesthesia: General

## 2023-06-16 MED ORDER — LIDOCAINE HCL (PF) 2 % IJ SOLN
INTRAMUSCULAR | Status: DC | PRN
Start: 2023-06-16 — End: 2023-06-16
  Administered 2023-06-16: 40 mg via INTRADERMAL

## 2023-06-16 MED ORDER — PROPOFOL 10 MG/ML IV BOLUS
INTRAVENOUS | Status: DC | PRN
  Administered 2023-06-16 (×3): 20 mg via INTRAVENOUS
  Administered 2023-06-16: 50 mg via INTRAVENOUS
  Administered 2023-06-16: 40 mg via INTRAVENOUS
  Administered 2023-06-16 (×2): 20 mg via INTRAVENOUS
  Administered 2023-06-16: 40 mg via INTRAVENOUS

## 2023-06-16 MED ORDER — SODIUM CHLORIDE 0.9 % IV SOLN
INTRAVENOUS | Status: DC
  Administered 2023-06-16: 1000 mL via INTRAVENOUS

## 2023-06-16 MED ORDER — PROPOFOL 1000 MG/100ML IV EMUL
INTRAVENOUS | Status: AC
  Filled 2023-06-16: qty 100

## 2023-06-16 NOTE — Op Note (Signed)
[facility]astroenterology Patient Name: Glenn Perez Procedure Date: 06/16/2023 12:35 PM MRN: [id number] Account #: [id number] Date of Birth: [birth year] Admit Type: Outpatient Age: 77 Room: [facility] ENDO ROOM 2 Gender: Male Note Status: Finalized Instrument Name: Prentice Docker [id number] Procedure:             Colonoscopy Indications:           High risk colon cancer surveillance: Personal history                         of colonic polyps Providers:             Boykin Nearing. Zeinab Rodwell MD, MD Medicines:             Propofol per Anesthesia Complications:         No immediate complications. Procedure:             Pre-Anesthesia Assessment:                        - The risks and benefits of the procedure and the                         sedation options and risks were discussed with the                         patient. All questions were answered and informed                         consent was obtained.                        - Patient identification and proposed procedure were                         verified prior to the procedure by the nurse. The                         procedure was verified in the procedure room.                        - ASA Grade Assessment: III - A patient with severe                         systemic disease.                        - After reviewing the risks and benefits, the patient                         was deemed in satisfactory condition to undergo the                         procedure.                        After obtaining informed consent, the colonoscope was                         passed under direct vision. Throughout the procedure,  the patient's blood pressure, pulse, and oxygen                         saturations were monitored continuously. The                         Colonoscope was introduced through the anus and                         advanced to the the cecum, identified by appendiceal                          orifice and ileocecal valve. The colonoscopy was                         performed without difficulty. The patient tolerated                         the procedure well. The quality of the bowel                         preparation was good. The ileocecal valve, appendiceal                         orifice, and rectum were photographed. Findings:      The perianal and digital rectal examinations were normal. Pertinent       negatives include normal sphincter tone and no palpable rectal lesions.      Non-bleeding internal hemorrhoids were found during retroflexion. The       hemorrhoids were Grade I (internal hemorrhoids that do not prolapse).      Many small-mouthed diverticula were found in the sigmoid colon.      The exam was otherwise without abnormality. Impression:            - Non-bleeding internal hemorrhoids.                        - Diverticulosis in the sigmoid colon.                        - The examination was otherwise normal.                        - No specimens collected. Recommendation:        - Patient has a contact number available for                         emergencies. The signs and symptoms of potential                         delayed complications were discussed with the patient.                         Return to normal activities tomorrow. Written                         discharge instructions were provided to the patient.                        -  Resume previous diet.                        - Continue present medications.                        - No repeat colonoscopy due to current age (18 years                         or older) and the absence of colonic polyps.                        - You do NOT require further colon cancer screening                         measures (Annual stool testing (i.e. hemoccult, FIT,                         cologuard), sigmoidoscopy, colonoscopy or CT                         colonography). You should share this recommendation                          with your Primary Care provider.                        - Return to GI office PRN.                        - The findings and recommendations were discussed with                         the patient. Procedure Code(s):     --- Professional ---                        G2694, Colorectal cancer screening; colonoscopy on                         individual at high risk Diagnosis Code(s):     --- Professional ---                        K57.30, Diverticulosis of large intestine without                         perforation or abscess without bleeding                        K64.0, First degree hemorrhoids                        Z86.010, Personal history of colonic polyps CPT copyright 2022 American Medical Association. All rights reserved. The codes documented in this report are preliminary and upon coder review may  be revised to meet current compliance requirements. Stanton Kidney MD, MD 06/16/2023 1:51:58 PM This report has been signed electronically. Number of Addenda: 0 Note Initiated On: 06/16/2023 12:35 PM Scope Withdrawal Time: 0 hours 6 minutes 51 seconds  Total Procedure Duration: 0 hours 11 minutes 24 seconds  Estimated Blood Loss:  Estimated blood loss: none.      [facility]

## 2023-06-16 NOTE — H&P (Signed)
Outpatient short stay form Pre-procedure 06/16/2023 1:26 PM Glenn Perez K. [name], M.D.  Primary Physician: Enid Baas, M.D.  Reason for visit:    History of present illness:  Glenn Perez reports overall doing well from a GI standpoint. He denies any issues w/ constipation, diarrhea, melena, rectal bleeding. No lower abd pain. Moves stools daily. Denies any GERD, nausea, vomiting, epigastric pain, dysphagia. Feels well today without any complaints.   Denies tobacco, and alcohol. Occasionally uses advil for neck stiffness but no heavy nsaid use.  No family hx colon cancer or colon polyps.     Current Facility-Administered Medications:    0.9 %  sodium chloride infusion, , Intravenous, Continuous, Triplett, Boykin Nearing, MD, Last Rate: 20 mL/hr at 06/16/23 1310, Continued from Pre-op at 06/16/23 1310  Medications Prior to Admission  Medication Sig Dispense Refill Last Dose   Multiple Vitamin (MULTIVITAMIN) capsule Take 1 capsule by mouth daily.        No Known Allergies   Past Medical History:  Diagnosis Date   Cancer [facility])    prostate   Dysrhythmia    Giant cell arteritis (HCC)    Syncope     Review of systems:  Otherwise negative.    Physical Exam  Gen: Alert, oriented. Appears stated age.  HEENT: [place]/AT. PERRLA. Lungs: CTA, no wheezes. CV: RR nl S1, S2. Abd: soft, benign, no masses. BS+ Ext: No edema. Pulses 2+    Planned procedures: Proceed with colonoscopy. The patient understands the nature of the planned procedure, indications, risks, alternatives and potential complications including but not limited to bleeding, infection, perforation, damage to internal organs and possible oversedation/side effects from anesthesia. The patient agrees and gives consent to proceed.  Please refer to procedure notes for findings, recommendations and patient disposition/instructions.     Glenn Perez K. [name], M.D. Gastroenterology 06/16/2023  1:26 PM

## 2023-06-16 NOTE — Interval H&P Note (Signed)
History and Physical Interval Note:  06/16/2023 1:28 PM  Glenn Perez  has presented today for surgery, with the diagnosis of V12.72 (ICD-9-CM) - Z86.010 (ICD-10-CM) - Personal history of colonic polyps.  The various methods of treatment have been discussed with the patient and family. After consideration of risks, benefits and other options for treatment, the patient has consented to  Procedure(s): COLONOSCOPY WITH PROPOFOL (N/A) as a surgical intervention.  The patient's history has been reviewed, patient examined, no change in status, stable for surgery.  I have reviewed the patient's chart and labs.  Questions were answered to the patient's satisfaction.     [place], [place]

## 2023-06-16 NOTE — Anesthesia Preprocedure Evaluation (Signed)
Anesthesia Evaluation  Patient identified by MRN, date of birth, ID band Patient awake    Reviewed: Allergy & Precautions, NPO status , Patient's Chart, lab work & pertinent test results  History of Anesthesia Complications Negative for: history of anesthetic complications  Airway Mallampati: III  TM Distance: <3 FB Neck ROM: full    Dental  (+) Chipped   Pulmonary neg pulmonary ROS, neg shortness of breath   Pulmonary exam normal        Cardiovascular Exercise Tolerance: Good (-) angina negative cardio ROS Normal cardiovascular exam     Neuro/Psych  Neuromuscular disease  negative psych ROS   GI/Hepatic negative GI ROS, Neg liver ROS,neg GERD  ,,  Endo/Other  negative endocrine ROS    Renal/GU negative Renal ROS  negative genitourinary   Musculoskeletal   Abdominal   Peds  Hematology negative hematology ROS (+)   Anesthesia Other Findings Past Medical History: No date: Cancer [facility])     Comment:  prostate No date: Giant cell arteritis (HCC) No date: Syncope  Past Surgical History: No date: APPENDECTOMY 05/20/2021: ARTERY BIOPSY; Left     Comment:  Procedure: BIOPSY TEMPORAL ARTERY;  Surgeon: Renford Dills, MD;  Location: [facility] ORS;  Service: Vascular;                Laterality: Left; No date: KNEE SURGERY No date: NOSE SURGERY     Comment:  x 2. No date: REPLACEMENT TOTAL KNEE 10/2015: SHOULDER SURGERY     Comment:  for partially torn rotator cuff and detached bicep               tendon.     Reproductive/Obstetrics negative OB ROS                             Anesthesia Physical Anesthesia Plan  ASA: 2  Anesthesia Plan: General   Post-op Pain Management:    Induction: Intravenous  PONV Risk Score and Plan: Propofol infusion and TIVA  Airway Management Planned: Natural Airway and Nasal Cannula  Additional Equipment:   Intra-op Plan:    Post-operative Plan:   Informed Consent: I have reviewed the patients History and Physical, chart, labs and discussed the procedure including the risks, benefits and alternatives for the proposed anesthesia with the patient or authorized representative who has indicated his/her understanding and acceptance.     Dental Advisory Given  Plan Discussed with: Anesthesiologist, CRNA and Surgeon  Anesthesia Plan Comments: (Patient consented for risks of anesthesia including but not limited to:  - adverse reactions to medications - risk of airway placement if required - damage to eyes, teeth, lips or other oral mucosa - nerve damage due to positioning  - sore throat or hoarseness - Damage to heart, brain, nerves, lungs, other parts of body or loss of life  Patient voiced understanding.)       Anesthesia Quick Evaluation

## 2023-06-16 NOTE — Transfer of Care (Signed)
Immediate Anesthesia Transfer of Care Note  Patient: Glenn Perez  Procedure(s) Performed: COLONOSCOPY WITH PROPOFOL  Patient Location: PACU and Endoscopy Unit  Anesthesia Type:MAC  Level of Consciousness: drowsy  Airway & Oxygen Therapy: Patient Spontanous Breathing and Patient connected to nasal cannula oxygen  Post-op Assessment: Report given to RN and Post -op Vital signs reviewed and stable  Post vital signs: Reviewed and stable  Last Vitals:  Vitals Value Taken Time  BP 97/48 06/16/23 1353  Temp 35.7 C 06/16/23 1353  Pulse 61 06/16/23 1354  Resp 18 06/16/23 1354  SpO2 99 % 06/16/23 1354  Vitals shown include unfiled device data.  Last Pain:  Vitals:   06/16/23 1353  TempSrc: Temporal  PainSc: 0-No pain         Complications: No notable events documented.

## 2023-06-17 ENCOUNTER — Encounter: Payer: Self-pay | Admitting: Internal Medicine

## 2023-06-17 NOTE — Anesthesia Postprocedure Evaluation (Signed)
Anesthesia Post Note  Patient: Glenn Perez  Procedure(s) Performed: COLONOSCOPY WITH PROPOFOL  Patient location during evaluation: Endoscopy Anesthesia Type: General Level of consciousness: awake and alert Pain management: pain level controlled Vital Signs Assessment: post-procedure vital signs reviewed and stable Respiratory status: spontaneous breathing, nonlabored ventilation, respiratory function stable and patient connected to nasal cannula oxygen Cardiovascular status: blood pressure returned to baseline and stable Postop Assessment: no apparent nausea or vomiting Anesthetic complications: no   No notable events documented.   Last Vitals:  Vitals:   06/16/23 1413 06/16/23 1423  BP: 115/67 124/78  Pulse: (!) 58 (!) 59  Resp: 15 12  Temp:    SpO2: 100% 100%    Last Pain:  Vitals:   06/17/23 0732  TempSrc:   PainSc: 0-No pain                 Cleda Mccreedy [name]

## 2023-06-21 DIAGNOSIS — R899 Unspecified abnormal finding in specimens from other organs, systems and tissues: Secondary | ICD-10-CM | POA: Diagnosis not present

## 2023-07-02 DIAGNOSIS — J329 Chronic sinusitis, unspecified: Secondary | ICD-10-CM | POA: Diagnosis not present

## 2023-07-02 DIAGNOSIS — Z03818 Encounter for observation for suspected exposure to other biological agents ruled out: Secondary | ICD-10-CM | POA: Diagnosis not present

## 2023-07-02 DIAGNOSIS — B9689 Other specified bacterial agents as the cause of diseases classified elsewhere: Secondary | ICD-10-CM | POA: Diagnosis not present

## 2023-08-12 DIAGNOSIS — I495 Sick sinus syndrome: Secondary | ICD-10-CM | POA: Diagnosis not present

## 2023-08-12 DIAGNOSIS — Z45018 Encounter for adjustment and management of other part of cardiac pacemaker: Secondary | ICD-10-CM | POA: Diagnosis not present

## 2023-08-12 DIAGNOSIS — Z950 Presence of cardiac pacemaker: Secondary | ICD-10-CM | POA: Diagnosis not present

## 2023-08-23 ENCOUNTER — Other Ambulatory Visit: Payer: Medicare HMO

## 2023-08-23 DIAGNOSIS — C61 Malignant neoplasm of prostate: Secondary | ICD-10-CM | POA: Diagnosis not present

## 2023-08-24 LAB — PSA: Prostate Specific Ag, Serum: 7.3 ng/mL — ABNORMAL HIGH (ref 0.0–4.0)

## 2023-08-27 ENCOUNTER — Ambulatory Visit: Payer: Medicare HMO | Admitting: Urology

## 2023-08-27 ENCOUNTER — Encounter: Payer: Self-pay | Admitting: Urology

## 2023-08-27 VITALS — BP 143/72 | HR 65 | Ht 72.0 in | Wt 170.0 lb

## 2023-08-27 DIAGNOSIS — C61 Malignant neoplasm of prostate: Secondary | ICD-10-CM

## 2023-08-27 DIAGNOSIS — R972 Elevated prostate specific antigen [PSA]: Secondary | ICD-10-CM

## 2023-08-27 NOTE — Progress Notes (Signed)
I, Glenn Perez, acting as a scribe for Glenn Altes, MD., have documented all relevant documentation on the behalf of Glenn Altes, MD, as directed by Glenn Altes, MD while in the presence of Glenn Altes, MD.  08/27/2023 3:27 PM   Glenn Perez 08/10/1946 [id number]  Referring provider: Enid Baas, MD [address] [place],  [place] [address]  Chief Complaint  Patient presents with   Elevated PSA   Urologic history: 1.  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 male presents for a 6 month follow-up visit.  Has had some urinary frequency. PSA 08/23/23 elevated above baseline at 7.3  PSA trend   Prostate Specific Ag, Serum  Latest Ref Rng 0.0 - 4.0 ng/mL  10/20/2018 4.0   03/12/2021 5.1 (H)   05/01/2021 5.4 (H)   01/01/2022 3.8   07/03/2022 5.4 (H)   01/14/2023 5.8 (H)   08/23/2023 7.3 (H)     PMH: Past Medical History:  Diagnosis Date   Cancer [facility])    prostate   Dysrhythmia    Giant cell arteritis (HCC)    Syncope     Surgical History: Past Surgical History:  Procedure Laterality Date   APPENDECTOMY     ARTERY BIOPSY Left 05/20/2021   Procedure: BIOPSY TEMPORAL ARTERY;  Surgeon: Renford Dills, MD;  Location: [facility] ORS;  Service: Vascular;  Laterality: Left;   COLONOSCOPY WITH PROPOFOL N/A 06/16/2023   Procedure: COLONOSCOPY WITH PROPOFOL;  Surgeon: Toledo, Boykin Nearing, MD;  Location: [facility] ENDOSCOPY;  Service: Gastroenterology;  Laterality: N/A;   KNEE SURGERY     NOSE SURGERY     x 2.   PACEMAKER INSERTION     REPLACEMENT TOTAL KNEE Bilateral    SHOULDER SURGERY  10/2015   for partially torn rotator cuff and detached bicep tendon.    Home Medications:   Allergies as of 08/27/2023   No Known Allergies      Medication List        Accurate as of August 27, 2023  3:27 PM. If you have any questions, ask your nurse or doctor.          multivitamin capsule Take 1 capsule by mouth daily.        Allergies: No Known Allergies  Family History: Family History  Problem Relation Age of Onset   Diverticulosis Mother    Cancer Brother        unsure of what kind    Social History:  reports that he has never smoked. He has never used smokeless tobacco. He reports that he does not currently use alcohol. He reports that he does not use drugs.   Physical Exam: BP (!) 143/72   Pulse 65   Ht 6' (1.829 m)   Wt 170 lb (77.1 kg)   BMI 23.06 kg/m   Constitutional:  Alert and oriented, No acute distress. HEENT: [place] AT, moist mucus membranes.  Trachea midline, no masses. Cardiovascular: No clubbing, cyanosis, or edema. Respiratory: Normal respiratory effort, no increased work of breathing. GI: Abdomen is soft, nontender, nondistended, no abdominal masses Skin: No rashes, bruises or suspicious lesions. Neurologic: Grossly intact, no focal deficits, moving all 4 extremities. Psychiatric: Normal mood and affect.  Assessment & Plan:    1. T1c very low risk prostate cancer PSA bumped to 7.3, however he had a confirmatory biopsy 6 months ago with low volume Gleason 3+3 and feel this is a transient elevation, most likely secondary to inflammation.  Recommend repeat PSA 6 weeks.  If his PSA remains elevated, he may desire referral to radiation oncology at [facility] to discuss brachytherapy.  [facility] [address], [address] [place], [place] [address] [phone]

## 2023-08-27 NOTE — Patient Instructions (Addendum)
High-intensity focused ultrasound (HIFU)  HIFU works by focusing sound waves from a transducer onto a specific area of the body. The sound waves generate heat that destroys the targeted tissue. The tissue can reach temperatures of 150-200F in as little as 20 seconds.  Here are some benefits of HIFU: Minimally invasive: HIFU is an outpatient procedure that doesn't require incisions or radiation. Short recovery: Patients can usually go home immediately after the procedure and have a short recovery time. Limited side effects: HIFU has a low risk of incontinence and erectile dysfunction.  Some disadvantages of HIFU include: It's not known how well HIFU works in the long term. It may not be suitable for people with large prostates. Some men may have longer-term side effects.

## 2023-09-02 DIAGNOSIS — Z0100 Encounter for examination of eyes and vision without abnormal findings: Secondary | ICD-10-CM | POA: Diagnosis not present

## 2023-10-08 ENCOUNTER — Other Ambulatory Visit: Payer: Medicare HMO

## 2023-10-08 DIAGNOSIS — C61 Malignant neoplasm of prostate: Secondary | ICD-10-CM

## 2023-10-08 DIAGNOSIS — R972 Elevated prostate specific antigen [PSA]: Secondary | ICD-10-CM | POA: Diagnosis not present

## 2023-10-09 LAB — PSA: Prostate Specific Ag, Serum: 6.8 ng/mL — ABNORMAL HIGH (ref 0.0–4.0)

## 2023-10-28 ENCOUNTER — Telehealth: Payer: Self-pay | Admitting: Urology

## 2023-10-28 NOTE — Telephone Encounter (Signed)
 He would like to know what the next steps will be going forward about his high PSA? He would like a call back.

## 2023-11-03 ENCOUNTER — Other Ambulatory Visit: Payer: Self-pay | Admitting: Urology

## 2023-11-03 DIAGNOSIS — R972 Elevated prostate specific antigen [PSA]: Secondary | ICD-10-CM

## 2023-11-03 DIAGNOSIS — C61 Malignant neoplasm of prostate: Secondary | ICD-10-CM

## 2023-11-09 ENCOUNTER — Telehealth: Payer: Self-pay | Admitting: Urology

## 2023-11-09 NOTE — Telephone Encounter (Signed)
 Patient called and is requesting clarification regarding his elevated PSA. Please advise patient.

## 2023-11-14 NOTE — Telephone Encounter (Signed)
 Contacted pt 11/12/2023. We discussed his PSA result which is still elevated above baseline. He has an appt with [facility] Radiation Oncology next week

## 2023-11-17 NOTE — Progress Notes (Signed)
 Radiation Oncology Consultation Note  Patient Name: Glenn Perez MRN: [id number] DOB: 08/15/1946  Date of Service: 11/18/2023   Referring Physician: Clotilda Jenkins [place] Primary Care Provider: Sherial Bail, MD  Patient Address [address] [place] [address]  Assessment  Glenn Perez is a 78 y.o. male who presents with prostate adenocarcinoma ([id number], PSA 5.4 ng/mL, highest Gleason score 3+3=6, grade group 1, 1/12 systematic cores positive, 0/0 targeted ROIs positive, 36 mL gland by MRI, AJCC 8th Ed. Stage I, low risk).  - PSA density is 0.21 ng/mL/mL - On active surveillance (Dr. Twylla)  Plan  - Agree with plan for current surveillance  Following a long conversation of the different treatment options for localized prostate cancer (see below), the patient, who has an anticipated life expectancy of at least 10 years, elected to continue active surveillance.  I did also offer him a second opinion with a urologic oncologist at [facility] regarding the surveillance paradigm; the patient will consider this and let me know if he would like to proceed with this option.  I had a lengthy conversation with the patient regarding the different treatment options of his low risk disease.  These options include active surveillance, prostatectomy, radiation with brachytherapy, definitive external beam radiation, or with an SBRT approach using Cyberknife.  Should the patient elect to pursue treatment, I feel SBRT can offer excellent rates of biochemical control with low-risk of side effects. As part of the treatment planning procedure, the patient would also undergo an MRI of the prostate, which would assess for extraprostatic extension or lymphadenopathy, which would be low probability in his case, as well as aid in prostate delineation.  In light of his low risk disease, I did not recommend concomitant ADT if he opts to pursue radiation therapy.  The patient had an opportunity to  ask questions, which were answered in detail to the patient's satisfaction.   ----  History of Present Illness   Glenn Perez is a 78 y.o. male who initially presented to medical attention with elevated PSA on routine screening.  The patient's PSA history is as follows:  No results found for: PSA, PSADIAG, PSASCRN  10/20/2018  4.0  03/12/2021  5.1 (H)  05/01/2021  5.4 (H)  01/01/2022  3.8  07/03/2022  5.4 (H)  01/14/2023  5.8 (H)  08/23/2023  7.3 (H)  10/08/23 6.7  He reportedly underwent an initial biopsy in 2022 for which all targeted cores were negative, and a single systematic core demonstrated grade group 1 disease and was started on active surveillance.  More recently underwent updated MRI and biopsy in early 2024 as below:  The patient underwent a transrectal ultrasound guided biopsy of the prostate on 02/12/23 without MRI fusion and targeted biopsies which demonstrated:  No results found for: [facility]   Sample Result  Left base Benign  Left mid Benign  Left apex Benign  Left lateral base Benign  Left lateral mid Benign  Left lateral apex Benign  Right base Benign  Right mid Benign  Right apex GG 1, involving 2% of the tissue  Right lateral base Benign  Right lateral mid Benign  Right lateral apex Benign   Genomic Biomarker Testing None    Negative germline genetic testing  MRI Prostate Dated: 12/30/22 1. No high-grade carcinoma identified in the peripheral zone.  Heterogeneous signal intensity suggest prior prostate inflammation.  PI-RADS: 2  2. Region of low signal intensity within the anterior apical  transitional zone unchanged from prior.  PI-RADS: 3  3. Enlarged nodular transitional zone most consistent with benign  prostate hypertrophy. 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   Colonoscopy: Up-to-date, last in 2019. Inflammatory bowel disease: No Anticoagulation: No   Prior Radiation Therapy:  No Pacemaker:  Yes Pregnancy: The patient is male.   Diarrhea - Grade 0 - Baseline Proctitis - Grade 0 - Baseline Fatigue - Grade 0 - Baseline Hot flashes - Grade 0 - Baseline Dermatitis radiation - Grade 0 - Baseline Urinary frequency - Grade 0 - Baseline Urinary incontinence - Grade 0 - Baseline Urinary retention - Grade 0 - Baseline Urinary tract pain - Grade 0 - Baseline Urinary urgency - Grade 0 - Baseline Bladder spasm - Grade 0 - Baseline   Nocturia 1 times per night   Hot Flashes 0 times per day   Alpha Antagonists: None Analgesics: None 5a Reductase Inhibitors: None Antispasmodics: None Bowel Agents: None Other (GI/GU) medications: None   Prior prostate procedures: None  Past Medical History  The patient  has a past medical history of Atrial fibrillation, transient (CMS-HCC), Cancer (CMS-HCC), History of giant cell arteritis, and Syncope.   Past Surgical History  The patient  has a past surgical history that includes right knee replacement (Right, 2020); left knee replacement (Left, 2010); rotator cuff repair (Right, 2017); Cataract extraction; and pr inser hart pacer xvenous atr/ventr (N/A, 03/03/2023).   Medications  The patient has a current medication list which includes the following prescription(s): cholecalciferol (vitamin d3), cyanocobalamin (vitamin b-12), and multivitamin.  Allergies  Patient has no known allergies.   Social History  Social History   Tobacco Use  . Smoking status: Never  . Smokeless tobacco: Not on file  Substance Use Topics  . Alcohol use: Not on file     Family History  His family history is not on file. He has no family status information on file.   family  history is not on file.  Brother with prostate cancer status post brachytherapy Grandfather with prostate cancer but died of colon cancer  Review of Systems (Positives are underlined)    Constitutional: Weight changes, fatigue, fevers, chills, night sweats HEENT:  Headaches, vision or hearing changes, tinnitus, lightheadedness, dizziness, epistaxis, hoarseness, sore throat, dysphagia CV:  Chest pain, pressure, palpitations, dyspnea on exertion, orthopnea, claudication, edema Resp: Cough, wheezing, hemoptysis  GI:  Nausea, vomiting, diarrhea, abdominal pain, jaundice, constipation, rectal bleeding, melena GU:  Dysuria, urinary frequency, urgency, hematuria, incontinence MSK:  Muscle weakness or pain, decreased range of motion Neuro:  Focal weakness, numbness, tingling, tremors, seizures, memory problems Endo:  Heat or cold intolerance, polyuria, polydipsia, tremor Skin:  New skin lesions, rashes Heme: Easy bruising, easy bleeding, lymphadenopathy Psychiatric: Anxiety, depression  Physical Examination  Wt Readings from Last 1 Encounters:  11/18/23 72.4 kg (159 lb 11.2 oz)   Temp Readings from Last 1 Encounters:  11/18/23 36.6 C (97.9 F) (Oral)   BP Readings from Last 1 Encounters:  06/07/23 126/61   Pulse Readings from Last 1 Encounters:  06/07/23 67   SpO2 Readings from Last 1 Encounters:  06/07/23 98%    General:  The patient is sitting comfortably in no acute distress. HEENT:  Normocephalic, atraumatic Lymph:  No palpable lymphadenopathy  CV:  Regular rate and rhythm Resp:  Good inspiratory effort Abd:  Soft, non-distended Back:  No bony tenderness Ext:  No clubbing, cyanosis, or edema Neuro:  CN II-XII grossly intact and symmetric, no focal neurologic deficits Skin:  No skin lesions were noted DRE: Not performed  ECOG Performance Status: 0 = Fully active, able to carry on all pre-disease performance without restriction  Imaging and Pathology: As documented in  the HPI.  Diagnosis  Malignant neoplasm of prostate (CMS-HCC) [C61]  --- Ozell BROCKS. Tresea, MD Assistant Professor Department of Radiation Oncology [facility]  [employer] [address], [address] [place], [place] [address] O: [phone] 11/18/23 12:05 PM

## 2023-11-18 DIAGNOSIS — C61 Malignant neoplasm of prostate: Secondary | ICD-10-CM | POA: Diagnosis not present

## 2023-11-24 DIAGNOSIS — Z950 Presence of cardiac pacemaker: Secondary | ICD-10-CM | POA: Diagnosis not present

## 2023-11-24 DIAGNOSIS — I495 Sick sinus syndrome: Secondary | ICD-10-CM | POA: Diagnosis not present

## 2023-11-24 DIAGNOSIS — Z4502 Encounter for adjustment and management of automatic implantable cardiac defibrillator: Secondary | ICD-10-CM | POA: Diagnosis not present

## 2024-01-12 DIAGNOSIS — H903 Sensorineural hearing loss, bilateral: Secondary | ICD-10-CM | POA: Diagnosis not present

## 2024-01-12 DIAGNOSIS — H6123 Impacted cerumen, bilateral: Secondary | ICD-10-CM | POA: Diagnosis not present

## 2024-01-18 DIAGNOSIS — H00012 Hordeolum externum right lower eyelid: Secondary | ICD-10-CM | POA: Diagnosis not present

## 2024-02-08 ENCOUNTER — Encounter (INDEPENDENT_AMBULATORY_CARE_PROVIDER_SITE_OTHER): Payer: Self-pay

## 2024-02-15 DIAGNOSIS — R4189 Other symptoms and signs involving cognitive functions and awareness: Secondary | ICD-10-CM | POA: Diagnosis not present

## 2024-02-23 DIAGNOSIS — Z950 Presence of cardiac pacemaker: Secondary | ICD-10-CM | POA: Diagnosis not present

## 2024-02-23 DIAGNOSIS — I495 Sick sinus syndrome: Secondary | ICD-10-CM | POA: Diagnosis not present

## 2024-02-23 DIAGNOSIS — Z45018 Encounter for adjustment and management of other part of cardiac pacemaker: Secondary | ICD-10-CM | POA: Diagnosis not present

## 2024-02-25 DIAGNOSIS — M25531 Pain in right wrist: Secondary | ICD-10-CM | POA: Diagnosis not present

## 2024-03-08 DIAGNOSIS — G4733 Obstructive sleep apnea (adult) (pediatric): Secondary | ICD-10-CM | POA: Diagnosis not present

## 2024-03-08 DIAGNOSIS — Z87898 Personal history of other specified conditions: Secondary | ICD-10-CM | POA: Diagnosis not present

## 2024-03-08 DIAGNOSIS — R569 Unspecified convulsions: Secondary | ICD-10-CM | POA: Diagnosis not present

## 2024-03-20 DIAGNOSIS — H26491 Other secondary cataract, right eye: Secondary | ICD-10-CM | POA: Diagnosis not present

## 2024-03-20 DIAGNOSIS — Z961 Presence of intraocular lens: Secondary | ICD-10-CM | POA: Diagnosis not present

## 2024-03-22 DIAGNOSIS — G3184 Mild cognitive impairment, so stated: Secondary | ICD-10-CM | POA: Diagnosis not present

## 2024-03-27 DIAGNOSIS — G4730 Sleep apnea, unspecified: Secondary | ICD-10-CM | POA: Diagnosis not present

## 2024-03-31 DIAGNOSIS — D2262 Melanocytic nevi of left upper limb, including shoulder: Secondary | ICD-10-CM | POA: Diagnosis not present

## 2024-03-31 DIAGNOSIS — Z85828 Personal history of other malignant neoplasm of skin: Secondary | ICD-10-CM | POA: Diagnosis not present

## 2024-03-31 DIAGNOSIS — D2261 Melanocytic nevi of right upper limb, including shoulder: Secondary | ICD-10-CM | POA: Diagnosis not present

## 2024-03-31 DIAGNOSIS — D225 Melanocytic nevi of trunk: Secondary | ICD-10-CM | POA: Diagnosis not present

## 2024-03-31 DIAGNOSIS — L570 Actinic keratosis: Secondary | ICD-10-CM | POA: Diagnosis not present

## 2024-03-31 DIAGNOSIS — D2272 Melanocytic nevi of left lower limb, including hip: Secondary | ICD-10-CM | POA: Diagnosis not present

## 2024-04-27 DIAGNOSIS — Z0100 Encounter for examination of eyes and vision without abnormal findings: Secondary | ICD-10-CM | POA: Diagnosis not present

## 2024-05-12 DIAGNOSIS — Z8739 Personal history of other diseases of the musculoskeletal system and connective tissue: Secondary | ICD-10-CM | POA: Diagnosis not present

## 2024-05-16 DIAGNOSIS — H903 Sensorineural hearing loss, bilateral: Secondary | ICD-10-CM | POA: Diagnosis not present

## 2024-05-24 DIAGNOSIS — Z950 Presence of cardiac pacemaker: Secondary | ICD-10-CM | POA: Diagnosis not present

## 2024-05-24 DIAGNOSIS — I495 Sick sinus syndrome: Secondary | ICD-10-CM | POA: Diagnosis not present

## 2024-05-30 ENCOUNTER — Ambulatory Visit: Admitting: Urology

## 2024-05-30 ENCOUNTER — Encounter: Payer: Self-pay | Admitting: Urology

## 2024-05-30 VITALS — BP 135/77 | HR 67 | Ht 72.0 in | Wt 171.0 lb

## 2024-05-30 DIAGNOSIS — C61 Malignant neoplasm of prostate: Secondary | ICD-10-CM | POA: Diagnosis not present

## 2024-05-30 NOTE — Progress Notes (Signed)
 05/30/2024 10:47 AM   Glenn Perez [birth year] [id number]  Referring provider: Sherial Bail, MD [address] [place],  [place] [address]  Chief Complaint  Patient presents with   Follow-up   Urologic history: 1.  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 male presents for follow-up visit.  Was seen by radiation oncology at [facility] 11/18/2023.  Of the radiation options it was felt SBRT approach using CyberKnife would have low risk of side effects  Today he states he is worried about his prostate cancer diagnosis and is not a type of person who waits for something to get out of control PSA performed [facility] 05/15/2024 was elevated above baseline at 9.74.  Urinalysis was unremarkable No bothersome LUTS   PMH: Past Medical History:  Diagnosis Date   Cancer [facility])    prostate   Dysrhythmia    Giant cell arteritis (HCC)    Syncope     Surgical History: Past Surgical History:  Procedure Laterality Date   APPENDECTOMY     ARTERY BIOPSY Left 05/20/2021   Procedure: BIOPSY TEMPORAL ARTERY;  Surgeon: Jama Cordella MATSU, MD;  Location: [facility] ORS;  Service: Vascular;  Laterality: Left;   COLONOSCOPY WITH PROPOFOL  N/A 06/16/2023   Procedure: COLONOSCOPY WITH PROPOFOL ;  Surgeon: Toledo, Ladell POUR, MD;  Location: [facility] ENDOSCOPY;  Service: Gastroenterology;  Laterality: N/A;   KNEE SURGERY     NOSE SURGERY     x 2.   PACEMAKER INSERTION     REPLACEMENT TOTAL KNEE Bilateral    SHOULDER SURGERY  10/2015   for  partially torn rotator cuff and detached bicep tendon.    Home Medications:  Allergies as of 05/30/2024   No Known Allergies      Medication List        Accurate as of May 30, 2024 10:47 AM. If you have any questions, ask your nurse or doctor.          cyanocobalamin 1000 MCG tablet Commonly known as: VITAMIN B12 Take 1,000 mcg by mouth.   multivitamin capsule Take 1 capsule by mouth daily.        Allergies: No Known Allergies  Family History: Family History  Problem Relation Age of Onset   Diverticulosis Mother    Cancer Brother        unsure of what kind    Social History:  reports that he has never smoked. He has never used smokeless tobacco. He reports that he does not currently use alcohol. He reports that he does not use drugs.   Physical Exam: BP 135/77   Pulse 67   Ht 6' (1.829 m)   Wt 171 lb (77.6 kg)   BMI 23.19 kg/m   Constitutional:  Alert, No acute distress. HEENT: [place] AT Respiratory: Normal respiratory effort, no increased work of breathing. Psychiatric: Normal mood and affect.   Assessment & Plan:    1.  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  We discussed RALP and radiation modalities.  We discussed my opinion that  RALP risk would outweigh the benefits.  If interested in pursuing radiation would follow-up at [facility]. HIFU was also discussed with the nearest practitioners [place] He asked for a recommendation from me regarding management and I discussed the decision for treatment versus surveillance is ultimately up to him.  We did discuss that active surveillance is the preferred option for very low risk prostate cancer.  I also told him if a biopsy report similar to his myself would elect active surveillance. His most recent PSA was elevated but baseline and we will asked that it be repeated at [facility] in November 2025. A 47-month follow-up appointment with  PSA/DRE was scheduled if he elects to continue active surveillance   [name], MD  [facility] [address] [place], [place] [address] [phone]

## 2024-06-13 ENCOUNTER — Ambulatory Visit: Admitting: Urology

## 2024-06-19 DIAGNOSIS — J019 Acute sinusitis, unspecified: Secondary | ICD-10-CM | POA: Diagnosis not present

## 2024-06-19 DIAGNOSIS — Z03818 Encounter for observation for suspected exposure to other biological agents ruled out: Secondary | ICD-10-CM | POA: Diagnosis not present

## 2024-08-25 DIAGNOSIS — E782 Mixed hyperlipidemia: Secondary | ICD-10-CM | POA: Diagnosis not present

## 2024-08-25 DIAGNOSIS — R55 Syncope and collapse: Secondary | ICD-10-CM | POA: Diagnosis not present

## 2024-08-25 DIAGNOSIS — I639 Cerebral infarction, unspecified: Secondary | ICD-10-CM | POA: Diagnosis not present

## 2024-08-25 DIAGNOSIS — M316 Other giant cell arteritis: Secondary | ICD-10-CM | POA: Diagnosis not present

## 2024-08-31 ENCOUNTER — Telehealth: Payer: Self-pay | Admitting: Urology

## 2024-08-31 NOTE — Telephone Encounter (Signed)
 Pt called office and scheduled an appointment for next week with Dr. Twylla.   He said the last time he was seen, he was told he had slow growing prostate cancer.  He wants to know if he should have a PSA done prior.  He wants to be more proactive about this.

## 2024-08-31 NOTE — Telephone Encounter (Signed)
 Patient did not get his psa in Nov 2025 at his pcp. Patient would like to have his psa done. So I scheduled him a lab visit for 09/04/2024. Patient would like to f/u on 09/06/2024 with Dr. Twylla as tracy scheduled . He wants to follow back sooner than march 2025.

## 2024-09-04 ENCOUNTER — Other Ambulatory Visit

## 2024-09-04 DIAGNOSIS — C61 Malignant neoplasm of prostate: Secondary | ICD-10-CM

## 2024-09-05 LAB — PSA: Prostate Specific Ag, Serum: 8.1 ng/mL — ABNORMAL HIGH (ref 0.0–4.0)

## 2024-09-06 ENCOUNTER — Encounter: Payer: Self-pay | Admitting: Urology

## 2024-09-06 ENCOUNTER — Ambulatory Visit: Admitting: Urology

## 2024-09-06 VITALS — BP 137/69 | HR 65 | Ht 72.0 in | Wt 173.0 lb

## 2024-09-06 DIAGNOSIS — C61 Malignant neoplasm of prostate: Secondary | ICD-10-CM | POA: Diagnosis not present

## 2024-09-08 NOTE — Progress Notes (Signed)
 "  09/06/2024 5:33 PM   Ivan Lacher Tyson [birth year] [id number]  Referring provider: Sherial Bail, MD [address] [place],  [place] [address]  Chief Complaint  Patient presents with   Prostate Cancer   Urologic history: 1.  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  Of the radiation options it was felt SBRT approach using CyberKnife would have low risk of side effects if treatment was desired   HPI: MOIZ RYANT is a 78 y.o. male presents for follow-up visit.  Follow-up PSA 09/04/2024 was improved compared to last PSA with PCP but elevated above baseline No bothersome LUTS Denies dysuria, gross hematuria No flank, abdominal or pelvic pain PSA 09/04/2024 8.1  PSA trend    Prostate Specific Ag, Serum  Latest Ref Rng 0.0 - 4.0 ng/mL  10/20/2018 4.0  03/12/2021 5.1  05/01/2021 5.4  01/01/2022 3.8  07/03/2022 5.4  01/14/2023 5.8  08/23/2023 7.3  10/08/2023 6.8  05/15/2024 9.74  09/04/2024 8.1      PMH: Past Medical History:  Diagnosis Date   Cancer [facility])    prostate   Dysrhythmia    Giant cell arteritis (HCC)    Syncope     Surgical History: Past Surgical History:  Procedure Laterality Date   APPENDECTOMY     ARTERY BIOPSY Left 05/20/2021   Procedure: BIOPSY TEMPORAL ARTERY;  Surgeon: Jama Cordella MATSU, MD;  Location: [facility] ORS;  Service: Vascular;  Laterality: Left;   COLONOSCOPY WITH PROPOFOL  N/A 06/16/2023   Procedure: COLONOSCOPY WITH PROPOFOL ;  Surgeon: Toledo, Ladell POUR, MD;   Location: [facility] ENDOSCOPY;  Service: Gastroenterology;  Laterality: N/A;   KNEE SURGERY     NOSE SURGERY     x 2.   PACEMAKER INSERTION     REPLACEMENT TOTAL KNEE Bilateral    SHOULDER SURGERY  10/2015   for partially torn rotator cuff and detached bicep tendon.    Home Medications:  Allergies as of 09/06/2024   No Known Allergies      Medication List        Accurate as of September 06, 2024 11:59 PM. If you have any questions, ask your nurse or doctor.          cyanocobalamin 1000 MCG tablet Commonly known as: VITAMIN B12 Take 1,000 mcg by mouth.   multivitamin capsule Take 1 capsule by mouth daily.        Allergies: No Known Allergies  Family History: Family History  Problem Relation Age of Onset   Diverticulosis Mother    Cancer Brother        unsure of what kind    Social History:  reports that he has never smoked. He has never used smokeless tobacco. He reports that he does not currently use alcohol. He reports that he does not use drugs.   Physical Exam: BP 137/69   Pulse 65   Ht 6' (1.829 m)   Wt 173 lb (78.5 kg)   BMI 23.46 kg/m   Constitutional:  Alert, No acute distress. HEENT: [place] AT Respiratory: Normal respiratory effort, no increased work of breathing. Psychiatric: Normal  mood and affect.   Assessment & Plan:    1.  T1c very low risk prostate cancer Remains anxious about a cancer diagnosis and managing with surveillance Also concerned about side effects of treatment options He was very interested in a second urologic opinion and will have him see Dr. Georganne for further discussion   [name], MD  [facility] [address] [place], [place] [address] [phone] "

## 2024-09-16 ENCOUNTER — Encounter: Payer: Self-pay | Admitting: Urology

## 2024-09-27 DIAGNOSIS — C61 Malignant neoplasm of prostate: Secondary | ICD-10-CM | POA: Insufficient documentation

## 2024-09-27 NOTE — Progress Notes (Unsigned)
" ° °  10/02/2024 10:45 AM   Glenn Perez 1945/10/05 [id number]  Reason for visit: Follow up prostate cancer   HPI: 79 y.o. male, initial follow up with me today, previously seen by Dr. 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  Of the radiation options it was felt SBRT approach using CyberKnife would have low risk of side effects if treatment was desired    Physical Exam: There were no vitals taken for this visit.   Constitutional:  Alert and oriented, No acute distress.  Laboratory Data:              Component Ref Range & Units (hover) 3 wk ago (09/04/24) 11 mo ago (10/08/23) 1 yr ago (08/23/23) 1 yr ago (01/14/23) 2 yr ago (07/03/22) 2 yr ago (01/01/22) 3 yr ago (05/01/21)  Prostate Specific Ag, Serum 8.1 High  6.8 High  CM 7.3 High  CM 5.8 High  CM 5.4 High  CM 3.8 CM 5.4 High      Pertinent Imaging: N/A    Assessment & Plan:    Prostate cancer [facility]) Assessment & Plan: Very low-risk prostate Ca (dx 2022)   - on AS  - PSA 8.1 (09/04/24)   - Last biopsy = May 2024 - GS6 in 1/12 cores (2%)   Reviewed his clinical history and prostate cancer diagnosis.  He has sought several opinions from urology and radiation oncology.  Considering his PSA data and biopsy from 2024 he remains squarely within a very low risk strata.  I would strongly advocate for continued active surveillance in this setting.  I would not recommend surgical management for his disease, particularly at his age.  I do  understand his anxiety regarding diagnosis and the possibility for slow continued progression, although I do not think it may affect him in his lifetime.  However, if he did want to desire definitive management I would lean towards targeted radiation therapy, SBRT may be a reasonable option.   - Recommend continued active surveillance, would not offer surgery in the setting - He may certainly revisit radiation options if he desires definitive treatment        Penne JONELLE Skye, MD  [facility] [address], [address] [place], [place] [address] [phone] "

## 2024-09-27 NOTE — Assessment & Plan Note (Signed)
 Very low-risk prostate Ca (dx 2022)   - on AS  - PSA 8.1 (09/04/24)   - Last biopsy = May 2024 - GS6 in 1/12 cores (2%)   Reviewed his clinical history and prostate cancer diagnosis.  He has sought several opinions from urology and radiation oncology.  Considering his PSA data and biopsy from 2024 he remains squarely within a very low risk strata.  I would strongly advocate for continued active surveillance in this setting.  I would not recommend surgical management for his disease, particularly at his age.  I do understand his anxiety regarding diagnosis and the possibility for slow continued progression, although I do not think it may affect him in his lifetime.  However, if he did want to desire definitive management I would lean towards targeted radiation therapy, SBRT may be a reasonable option.   - Recommend continued active surveillance, would not offer surgery in the setting - He may certainly revisit radiation options if he desires definitive treatment

## 2024-10-02 ENCOUNTER — Ambulatory Visit: Admitting: Urology

## 2024-10-02 DIAGNOSIS — C61 Malignant neoplasm of prostate: Secondary | ICD-10-CM

## 2024-10-03 ENCOUNTER — Encounter: Payer: Self-pay | Admitting: Urology

## 2024-11-27 ENCOUNTER — Other Ambulatory Visit

## 2024-11-28 ENCOUNTER — Ambulatory Visit: Admitting: Urology
